# Patient Record
Sex: Male | Born: 1940 | Race: Black or African American | Hispanic: No | Marital: Married | Smoking: Current every day smoker
Health system: Southern US, Community
[De-identification: ages and names within clinical notes are randomized; demographics above are authoritative.]

## PROBLEM LIST (undated history)

## (undated) DIAGNOSIS — N419 Inflammatory disease of prostate, unspecified: Secondary | ICD-10-CM

## (undated) DIAGNOSIS — R972 Elevated prostate specific antigen [PSA]: Secondary | ICD-10-CM

## (undated) DIAGNOSIS — C61 Malignant neoplasm of prostate: Secondary | ICD-10-CM

## (undated) DIAGNOSIS — Z923 Personal history of irradiation: Secondary | ICD-10-CM

## (undated) DIAGNOSIS — C189 Malignant neoplasm of colon, unspecified: Secondary | ICD-10-CM

## (undated) DIAGNOSIS — J449 Chronic obstructive pulmonary disease, unspecified: Secondary | ICD-10-CM

## (undated) DIAGNOSIS — K562 Volvulus: Secondary | ICD-10-CM

## (undated) DIAGNOSIS — E559 Vitamin D deficiency, unspecified: Secondary | ICD-10-CM

## (undated) DIAGNOSIS — E785 Hyperlipidemia, unspecified: Secondary | ICD-10-CM

## (undated) HISTORY — DX: Inflammatory disease of prostate, unspecified: N41.9

## (undated) HISTORY — PX: TONSILLECTOMY: SUR1361

## (undated) HISTORY — DX: Elevated prostate specific antigen (PSA): R97.20

## (undated) HISTORY — DX: Hyperlipidemia, unspecified: E78.5

## (undated) HISTORY — DX: Personal history of irradiation: Z92.3

## (undated) HISTORY — DX: Chronic obstructive pulmonary disease, unspecified: J44.9

## (undated) HISTORY — DX: Vitamin D deficiency, unspecified: E55.9

---

## 2004-05-04 ENCOUNTER — Encounter: Admission: RE | Admit: 2004-05-04 | Discharge: 2004-05-04 | Payer: Self-pay | Admitting: Orthopedic Surgery

## 2004-05-20 ENCOUNTER — Encounter: Admission: RE | Admit: 2004-05-20 | Discharge: 2004-05-20 | Payer: Self-pay | Admitting: Orthopedic Surgery

## 2005-09-09 ENCOUNTER — Emergency Department (HOSPITAL_COMMUNITY): Admission: EM | Admit: 2005-09-09 | Discharge: 2005-09-09 | Payer: Self-pay | Admitting: Emergency Medicine

## 2011-01-09 ENCOUNTER — Encounter: Payer: Self-pay | Admitting: Orthopedic Surgery

## 2012-12-04 ENCOUNTER — Ambulatory Visit
Admission: RE | Admit: 2012-12-04 | Discharge: 2012-12-04 | Disposition: A | Discharge status: Home / Self Care | Payer: Medicare Other | Source: Ambulatory Visit | Attending: Internal Medicine | Admitting: Internal Medicine

## 2012-12-04 ENCOUNTER — Other Ambulatory Visit: Payer: Self-pay | Admitting: Internal Medicine

## 2012-12-04 DIAGNOSIS — J449 Chronic obstructive pulmonary disease, unspecified: Secondary | ICD-10-CM

## 2012-12-04 DIAGNOSIS — Z720 Tobacco use: Secondary | ICD-10-CM

## 2012-12-27 LAB — LIPID PANEL
LDL Cholesterol: 148 mg/dL
Triglycerides: 91 mg/dL (ref 40–160)

## 2012-12-27 LAB — CBC AND DIFFERENTIAL
HCT: 44 % (ref 41–53)
Hemoglobin: 14.6 g/dL (ref 13.5–17.5)
Platelets: 235 10*3/uL (ref 150–399)
WBC: 5.8 10^3/mL

## 2012-12-27 LAB — HEPATIC FUNCTION PANEL
ALT: 16 U/L (ref 10–40)
Bilirubin, Total: 0.4 mg/dL

## 2012-12-27 LAB — BASIC METABOLIC PANEL: Glucose: 94 mg/dL

## 2013-02-26 ENCOUNTER — Encounter: Payer: Self-pay | Admitting: Hematology

## 2013-02-27 DIAGNOSIS — C61 Malignant neoplasm of prostate: Secondary | ICD-10-CM

## 2013-02-27 HISTORY — DX: Malignant neoplasm of prostate: C61

## 2013-02-27 HISTORY — PX: PROSTATE BIOPSY: SHX241

## 2013-04-22 ENCOUNTER — Other Ambulatory Visit: Payer: Self-pay | Admitting: Urology

## 2013-04-22 DIAGNOSIS — C61 Malignant neoplasm of prostate: Secondary | ICD-10-CM

## 2013-04-29 ENCOUNTER — Encounter (HOSPITAL_COMMUNITY)
Admission: RE | Admit: 2013-04-29 | Discharge: 2013-04-29 | Disposition: A | Discharge status: Home / Self Care | Payer: Medicare Other | Source: Ambulatory Visit | Attending: Urology | Admitting: Urology

## 2013-04-29 DIAGNOSIS — C61 Malignant neoplasm of prostate: Secondary | ICD-10-CM | POA: Insufficient documentation

## 2013-04-29 MED ORDER — TECHNETIUM TC 99M MEDRONATE IV KIT
25.0000 | PACK | Freq: Once | INTRAVENOUS | Status: AC | PRN
  Administered 2013-04-29: 25 via INTRAVENOUS

## 2013-08-14 ENCOUNTER — Encounter: Payer: Self-pay | Admitting: Radiation Oncology

## 2013-08-14 NOTE — Progress Notes (Signed)
GU Location of Tumor / Histology: Prostate Cancer  If Prostate Cancer, Gleason Score is (3 + 3=6 in left lat base, left base and left lat mid) and PSA is (21.06 from 07/23/2013), prostate volume is 133 cc.  Patient presented with elevated PSA of 17.38 on 12/27/2012.  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  No rectal irritation   Nausea/Vomiting, if any: NO  Pain issues, if any:  None   SAFETY ISSUES:  Prior radiation? No   Pacemaker/ICD? no  Possible current pregnancy? no  Is the patient on methotrexate? no  Current Complaints / other details:  None presently

## 2013-08-15 ENCOUNTER — Ambulatory Visit
Admission: RE | Admit: 2013-08-15 | Discharge: 2013-08-15 | Disposition: A | Discharge status: Home / Self Care | Payer: Medicare Other | Source: Ambulatory Visit | Attending: Radiation Oncology | Admitting: Radiation Oncology

## 2013-08-15 ENCOUNTER — Encounter: Payer: Self-pay | Admitting: Radiation Oncology

## 2013-08-15 ENCOUNTER — Telehealth: Payer: Self-pay | Admitting: *Deleted

## 2013-08-15 VITALS — BP 131/88 | HR 68 | Temp 98.0°F | Ht 68.0 in | Wt 160.0 lb

## 2013-08-15 DIAGNOSIS — C61 Malignant neoplasm of prostate: Secondary | ICD-10-CM

## 2013-08-15 HISTORY — DX: Malignant neoplasm of prostate: C61

## 2013-08-15 NOTE — Progress Notes (Signed)
[facility] Radiation Oncology NEW PATIENT EVALUATION  Name: Charles Espinoza MRN: [id number]  Date:   08/15/2013           DOB: 1941/03/02  Status: outpatient   CC: Charles Saucer [name], MD  Lindaann Slough, MD    REFERRING PHYSICIAN: Lindaann Slough, MD   DIAGNOSIS: Stage TI C. intermediate risk adenocarcinoma prostate (based on prostate volume)   HISTORY OF PRESENT ILLNESS:  Charles Espinoza is a 72 y.o. male who is seen today for the courtesy of Dr. Brunilda Payor for discussion of possible radiation therapy in the management of his stage TI C. 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 He was noted to have a PSA of 17.38 by Dr. [name] back in January 2014. He was told in  the past that his PSAs have been elevated. We do not have these records. His PSA in April of 2014 was 17.47 and then rose to 21.06 on 07/23/2013. On 02/27/2013 he had ultrasound-guided biopsies revealing Gleason 6 (3+3) involving 5% of one core from left lateral base, 50% of one core from the left base and 5% of one core from the left lateral mid gland. His gland volume was 133 cc. Staging workup was negative. Because of his recent rise in PSA he is seen today for discussion of possible external beam/IMRT. He is doing well from a GU and GI standpoint. His I PSS score is 3. He claims to be potent. He is rather active and golfs multiple times a week.   PREVIOUS RADIATION THERAPY: No   PAST MEDICAL HISTORY:  has a past medical history of COPD (chronic obstructive pulmonary disease); Vitamin D deficiency; Hyperlipidemia; Prostatitis; Elevated PSA; and Prostate cancer.     PAST SURGICAL HISTORY:  Past Surgical History  Procedure Laterality Date  . Tonsillectomy    . Prostate biopsy  02/27/2013     FAMILY HISTORY: family history includes Prostate cancer in an other family member. His mother is alive and well at 47 and lives independently. His father died from "natural causes" at age 47. His maternal uncle was diagnosed with  prostate cancer in his 68s.   SOCIAL HISTORY:  reports that he quit smoking about 8 months ago. He does not have any smokeless tobacco history on file. Mary for over 50 years, 3 children. Retired Financial risk analyst.   ALLERGIES: Penicillins   MEDICATIONS:  Current Outpatient Prescriptions  Medication Sig Dispense Refill  . aspirin 81 MG tablet Take 81 mg by mouth daily. 2 po daily      . Multiple Vitamin (MULTIVITAMIN) tablet Take 1 tablet by mouth daily.       No current facility-administered medications for this encounter.     REVIEW OF SYSTEMS:  Pertinent items are noted in HPI.    PHYSICAL EXAM:  height is 5\' 8"  (1.727 m) and weight is 160 lb (72.576 kg). His temperature is 98 F (36.7 C). His blood pressure is 131/88 and his pulse is 68.   Alert and oriented 72 year old African American male appearing younger than his stated age. Head and neck examination: Grossly unremarkable. Nodes: Without palpable cervical or supraclavicular lymphadenopathy. Chest: Lungs clear. Back: Without spinal or CVA tenderness. Abdomen: Without masses organomegaly. Genitalia: Unremarkable to inspection. Rectal: The prostate gland is markedly enlarged and is without induration or nodularity. Extremities: Without edema. Neurologic examination: Grossly nonfocal.   LABORATORY DATA:  Lab Results  Component Value Date   WBC 5.8 12/27/2012   HGB 14.6 12/27/2012   HCT 44 12/27/2012   PLT  235 12/27/2012   Lab Results  Component Value Date   NA 143 12/27/2012   K 4.2 12/27/2012   Lab Results  Component Value Date   ALT 16 12/27/2012   AST 21 12/27/2012   ALKPHOS 71 12/27/2012   PSA 21.06 from 07/23/2013   IMPRESSION: Stage TI C. intermediate risk adenocarcinoma prostate (based on prostate volume). I explained to the patient that his prognosis is related to his stage, PSA level, and Gleason score. His stage and Gleason score are favorable while his absolute PSA level is technically in the  unfavorable risk category  (over 20). We do not have his PSA values from the remote past .  However, he does have a large prostate volume would certainly contributes to his PSA level, and he is probably best placed in the intermediate risk category (PSA from 10-20). He is rather fit for his age of 28, and I would offer him potentially curative therapy. His gland volume is too large for seed implantation, and we are left with external beam/IMRT which I think would be a good option. We discussed the potential acute and late toxicities of radiation therapy. We also talked about the possibility of "downsizing" his gland to treat smaller volume, and therefore he smaller portion of his rectum, but he is sexually active, and this would affect his quality of life. He declines this option. Therefore we'll proceed with treatment to his current gland volume of approximately 133 cc. We also discussed treating him with a comfortably full bladder to minimize treatment-related toxicity. I explained him that I need to have Dr. Brunilda Payor placed 3 gold markers for prostate image guidance during his treatment. Once this has been performed, he will return afterwards for simulation/treatment planning. Consent is signed today.   PLAN: As discussed above.  I spent 40 minutes minutes face to face with the patient and more than 50% of that time was spent in counseling and/or coordination of care.

## 2013-08-15 NOTE — Addendum Note (Signed)
Encounter addended by: Maryln Gottron, MD on: 08/15/2013 10:54 AM<BR>     Documentation filed: Normajean Glasgow VN

## 2013-08-15 NOTE — Telephone Encounter (Signed)
CALLED PATIENT TO INFORM OF GOLD SEED PLACEMENT ON 09-26-13- ARRIVAL TIME - 2:45 PM AT DR. Brunilda Payor' OFFICE AND HIS SIM ON 09-30-13 AT 10 AM AT DR. MURRAY'S OFFICE, SPOKE WITH PATIENT AND HE IS AWARE OF THESE APPTS.

## 2013-08-27 ENCOUNTER — Telehealth: Payer: Self-pay | Admitting: *Deleted

## 2013-08-27 NOTE — Telephone Encounter (Signed)
Called patient to inform of gold seed placement on 09-10-13  At 2:30 pm at Dr. Brunilda Payor' Office and his sim on Sept. 29 at 9:00 am, spoke with patient and he is aware of these appts.

## 2013-09-16 ENCOUNTER — Ambulatory Visit
Admission: RE | Admit: 2013-09-16 | Discharge: 2013-09-16 | Disposition: A | Discharge status: Home / Self Care | Payer: Medicare Other | Source: Ambulatory Visit | Attending: Radiation Oncology | Admitting: Radiation Oncology

## 2013-09-16 DIAGNOSIS — R339 Retention of urine, unspecified: Secondary | ICD-10-CM | POA: Insufficient documentation

## 2013-09-16 DIAGNOSIS — Z510 Encounter for antineoplastic radiation therapy: Secondary | ICD-10-CM | POA: Insufficient documentation

## 2013-09-16 DIAGNOSIS — C61 Malignant neoplasm of prostate: Secondary | ICD-10-CM | POA: Insufficient documentation

## 2013-09-16 NOTE — Progress Notes (Signed)
Complex simulation/treatment planning note:  The patient was taken to the CT simulator. A Vac lock immobilization device was constructed. A red rubber catheter was placed within the rectal vault. He was in catheterized and contrast instilled into the bladder/urethra. His pelvis was scanned. I contoured his rectum, bladder, seminal vesicles and prostate. I prescribing 7600 cGy in 38 sessions. The prostate PTV represents the prostate was 0.8 cm except for 0.5 cm along the rectum. I prescribing 5320 cGy in 30 sessions to his seminal vesicles which include the seminal vesicles posterior 0.5 cm. He'll be treated with a company full bladder. I am also requesting daily cone beam CT setting up to his 3 gold seeds.

## 2013-09-19 ENCOUNTER — Encounter: Payer: Self-pay | Admitting: Radiation Oncology

## 2013-09-19 NOTE — Progress Notes (Signed)
IMRT simulation/treatment planning note: The patient underwent IMRT/treatment planning in the management of his carcinoma the prostate. IMRT was chosen to decrease the risk for both acute and late bladder and rectal toxicity compared to 3-D conformal or conventional radiation therapy. Dose volume histograms were obtained for the target structures and also the avoidance structures including the bladder, rectum, and femoral heads. We met our departmental goals except for the bladder which was incompletely field. It is expected that this will improve when he has a comfortably full bladder during his course of treatment. I'm prescribing 7600 cGy in 30 sessions to his prostate PTV which represents the prostate was 0.8 cm except for 0.5 cm along the rectum. He is be treated with a company full bladder and he'll undergo daily cone beam CT setting up to his 3 gold seeds.

## 2013-09-23 ENCOUNTER — Ambulatory Visit: Payer: Medicare Other | Admitting: Radiation Oncology

## 2013-09-24 ENCOUNTER — Ambulatory Visit: Payer: Medicare Other

## 2013-09-25 ENCOUNTER — Ambulatory Visit
Admission: RE | Admit: 2013-09-25 | Discharge: 2013-09-25 | Disposition: A | Discharge status: Home / Self Care | Payer: Medicare Other | Source: Ambulatory Visit | Attending: Radiation Oncology | Admitting: Radiation Oncology

## 2013-09-25 ENCOUNTER — Ambulatory Visit: Payer: Medicare Other

## 2013-09-26 ENCOUNTER — Ambulatory Visit: Payer: Medicare Other

## 2013-09-26 ENCOUNTER — Ambulatory Visit
Admission: RE | Admit: 2013-09-26 | Discharge: 2013-09-26 | Disposition: A | Discharge status: Home / Self Care | Payer: Medicare Other | Source: Ambulatory Visit | Attending: Radiation Oncology | Admitting: Radiation Oncology

## 2013-09-26 DIAGNOSIS — C61 Malignant neoplasm of prostate: Secondary | ICD-10-CM

## 2013-09-26 NOTE — Progress Notes (Signed)
Post sim ed completed w/pt. Gave pt "Radiation and You" booklet w/all pertinent information marked and discussed, re: fatigue, rectal irritation/care, urinary-bladder irritation/management, nutrition, pain. All questions answered. Pt inquired about what angles his prostate was being treated; advised he ask therapists tomorrow. Pt verbalized understanding.

## 2013-09-27 ENCOUNTER — Ambulatory Visit
Admission: RE | Admit: 2013-09-27 | Discharge: 2013-09-27 | Disposition: A | Discharge status: Home / Self Care | Payer: Medicare Other | Source: Ambulatory Visit | Attending: Radiation Oncology | Admitting: Radiation Oncology

## 2013-09-27 ENCOUNTER — Ambulatory Visit: Payer: Medicare Other

## 2013-09-30 ENCOUNTER — Telehealth: Payer: Self-pay | Admitting: *Deleted

## 2013-09-30 ENCOUNTER — Ambulatory Visit: Admission: RE | Admit: 2013-09-30 | Payer: Medicare Other | Source: Ambulatory Visit

## 2013-09-30 ENCOUNTER — Ambulatory Visit: Admission: RE | Admit: 2013-09-30 | Payer: Medicare Other | Source: Ambulatory Visit | Admitting: Radiation Oncology

## 2013-09-30 ENCOUNTER — Ambulatory Visit: Payer: Medicare Other

## 2013-09-30 ENCOUNTER — Ambulatory Visit
Admission: RE | Admit: 2013-09-30 | Discharge: 2013-09-30 | Disposition: A | Discharge status: Home / Self Care | Payer: Medicare Other | Source: Ambulatory Visit | Attending: Radiation Oncology | Admitting: Radiation Oncology

## 2013-09-30 ENCOUNTER — Other Ambulatory Visit: Payer: Self-pay | Admitting: Radiation Oncology

## 2013-09-30 DIAGNOSIS — C61 Malignant neoplasm of prostate: Secondary | ICD-10-CM

## 2013-09-30 NOTE — Telephone Encounter (Signed)
Received call from pt's wife stating pt was up all night trying to urinate, only able to urinate small amounts and dribbling. Pt c/o "stinging, pressure and feeling bloated". Spoke w/Dr Dayton Scrape; per Dr Dayton Scrape pt to come into nursing to see Dr Dayton Scrape now. Called wife and informed her to have pt come in to see Dr Dayton Scrape now. She verbalized understanding.

## 2013-09-30 NOTE — Progress Notes (Addendum)
Weekly Management Note:  Site: Prostate Current Dose:  600  cGy Projected Dose: 7600  cGy  Narrative: The patient is seen today for routine under treatment assessment. CBCT/MVCT images/port films were reviewed. The chart was reviewed.   The patient went into urinary retention prior to his scheduled treatment today. He was at a funeral yesterday and held his bladder "too long". He went into retention yesterday evening. He was seen by Dr. Brunilda Payor and a catheter was placed. The catheter will be removed this Friday.  Physical Examination: There were no vitals filed for this visit..  Weight:  . No change.  Impression: Tolerating radiation therapy well, however, he went into urinary retention. I do not think that his 3 treatments of radiation therapy precipitated his urinary retention. Of note is that his preradiation I PSS score was only 3.  Plan: Continue radiation therapy as planned. We will get him this week off and resume his radiation therapy on Monday, October 20.

## 2013-09-30 NOTE — Telephone Encounter (Signed)
Dr Dayton Scrape spoke w/ Dr Madilyn Hook office re: pt's status this morning. Per Dr Dayton Scrape, pt to go straight to Dr Madilyn Hook office and be seen by NP. Spoke w/Alice, pt's wife and instructed her to have pt go directly to Dr [employer] office this morning. Advised pt ask NP if he should cancel his radiation appointment today. Wife verbalized understanding of all above.

## 2013-10-01 ENCOUNTER — Ambulatory Visit: Payer: Medicare Other

## 2013-10-02 ENCOUNTER — Ambulatory Visit: Payer: Medicare Other

## 2013-10-03 ENCOUNTER — Ambulatory Visit: Payer: Medicare Other

## 2013-10-04 ENCOUNTER — Ambulatory Visit: Payer: Medicare Other

## 2013-10-04 ENCOUNTER — Telehealth: Payer: Self-pay | Admitting: *Deleted

## 2013-10-04 NOTE — Telephone Encounter (Signed)
Per Dr Dayton Scrape, spoke w.pt and advised him to come to [facility] Monday at 10:15 am to see Dr Dayton Scrape before his radiation treatment scheduled for 10:40 am. Pt repeated instructions accurately. Pt states he just got home from urologist's office where his foley catheter was removed. Flagged pt's ARIA schedule to reflect seeing dr prior to treatment. Linac 1 notified by e mail.

## 2013-10-07 ENCOUNTER — Encounter: Payer: Self-pay | Admitting: Radiation Oncology

## 2013-10-07 ENCOUNTER — Ambulatory Visit: Payer: Medicare Other

## 2013-10-07 ENCOUNTER — Ambulatory Visit
Admission: RE | Admit: 2013-10-07 | Discharge: 2013-10-07 | Disposition: A | Discharge status: Home / Self Care | Payer: Medicare Other | Source: Ambulatory Visit | Attending: Radiation Oncology | Admitting: Radiation Oncology

## 2013-10-07 DIAGNOSIS — C61 Malignant neoplasm of prostate: Secondary | ICD-10-CM

## 2013-10-07 NOTE — Progress Notes (Signed)
Weekly Management Note:  Site: Prostate Current Dose:  800  cGy Projected Dose: 7600  cGy  Narrative: The patient is seen today for routine under treatment assessment. CBCT/MVCT images/port films were reviewed. The chart was reviewed.   He is seen today prior to his scheduled treatment after being rested last week. He went into urinary retention and his catheter was removed this past Friday. He was placed on finasteride and Rapaflo. He's not having urinary difficulties today. Cone beam CT will be reviewed later today.  Physical Examination: There were no vitals filed for this visit..  Weight:  . No change.  Impression: Tolerating radiation therapy well. We may add 1 extra treatment because of his one-week break.  Plan: Continue radiation therapy as planned.

## 2013-10-07 NOTE — Progress Notes (Signed)
Pt states his foley catheter was removed on 10/05/13. He denies voiding issues since that time. He denies bowel issues, pain, fatigue, loss of appetite. He states he was put on Finasteride and Rapaflo, doses unknown.

## 2013-10-08 ENCOUNTER — Ambulatory Visit: Payer: Medicare Other

## 2013-10-08 ENCOUNTER — Ambulatory Visit
Admission: RE | Admit: 2013-10-08 | Discharge: 2013-10-08 | Disposition: A | Discharge status: Home / Self Care | Payer: Medicare Other | Source: Ambulatory Visit | Attending: Radiation Oncology | Admitting: Radiation Oncology

## 2013-10-09 ENCOUNTER — Ambulatory Visit
Admission: RE | Admit: 2013-10-09 | Discharge: 2013-10-09 | Disposition: A | Discharge status: Home / Self Care | Payer: Medicare Other | Source: Ambulatory Visit | Attending: Radiation Oncology | Admitting: Radiation Oncology

## 2013-10-09 ENCOUNTER — Ambulatory Visit: Payer: Medicare Other

## 2013-10-10 ENCOUNTER — Ambulatory Visit
Admission: RE | Admit: 2013-10-10 | Discharge: 2013-10-10 | Disposition: A | Discharge status: Home / Self Care | Payer: Medicare Other | Source: Ambulatory Visit | Attending: Radiation Oncology | Admitting: Radiation Oncology

## 2013-10-10 ENCOUNTER — Ambulatory Visit: Payer: Medicare Other

## 2013-10-11 ENCOUNTER — Ambulatory Visit: Payer: Medicare Other

## 2013-10-11 ENCOUNTER — Ambulatory Visit
Admission: RE | Admit: 2013-10-11 | Discharge: 2013-10-11 | Disposition: A | Discharge status: Home / Self Care | Payer: Medicare Other | Source: Ambulatory Visit | Attending: Radiation Oncology | Admitting: Radiation Oncology

## 2013-10-14 ENCOUNTER — Ambulatory Visit: Admission: RE | Admit: 2013-10-14 | Payer: Medicare Other | Source: Ambulatory Visit | Admitting: Radiation Oncology

## 2013-10-14 ENCOUNTER — Ambulatory Visit
Admission: RE | Admit: 2013-10-14 | Discharge: 2013-10-14 | Disposition: A | Discharge status: Home / Self Care | Payer: Medicare Other | Source: Ambulatory Visit | Attending: Radiation Oncology | Admitting: Radiation Oncology

## 2013-10-14 ENCOUNTER — Ambulatory Visit: Payer: Medicare Other

## 2013-10-14 DIAGNOSIS — C61 Malignant neoplasm of prostate: Secondary | ICD-10-CM

## 2013-10-14 NOTE — Addendum Note (Signed)
Encounter addended by: Glennie Hawk, RN on: 10/14/2013 11:20 AM<BR>     Documentation filed: Notes Section

## 2013-10-14 NOTE — Progress Notes (Signed)
Pt seen by Dr Dayton Scrape in treatment area. No nursing assessment completed.

## 2013-10-14 NOTE — Progress Notes (Signed)
Weekly Management Note:  Site: Prostate Current Dose:  1800  cGy Projected Dose: 7600  cGy  Narrative: The patient is seen today for routine under treatment assessment. CBCT/MVCT images/port films were reviewed. The chart was reviewed.   Bladder filling has been satisfactory. No new GU or GI difficulty. He seen today before his treatment.  Physical Examination: There were no vitals filed for this visit..  Weight:  . No change.  Impression: Tolerating radiation therapy well.  Plan: Continue radiation therapy as planned.

## 2013-10-15 ENCOUNTER — Ambulatory Visit: Payer: Medicare Other

## 2013-10-15 ENCOUNTER — Ambulatory Visit
Admission: RE | Admit: 2013-10-15 | Discharge: 2013-10-15 | Disposition: A | Discharge status: Home / Self Care | Payer: Medicare Other | Source: Ambulatory Visit | Attending: Radiation Oncology | Admitting: Radiation Oncology

## 2013-10-16 ENCOUNTER — Ambulatory Visit
Admission: RE | Admit: 2013-10-16 | Discharge: 2013-10-16 | Disposition: A | Discharge status: Home / Self Care | Payer: Medicare Other | Source: Ambulatory Visit | Attending: Radiation Oncology | Admitting: Radiation Oncology

## 2013-10-17 ENCOUNTER — Ambulatory Visit
Admission: RE | Admit: 2013-10-17 | Discharge: 2013-10-17 | Disposition: A | Discharge status: Home / Self Care | Payer: Medicare Other | Source: Ambulatory Visit | Attending: Radiation Oncology | Admitting: Radiation Oncology

## 2013-10-17 ENCOUNTER — Ambulatory Visit: Payer: Medicare Other

## 2013-10-18 ENCOUNTER — Ambulatory Visit: Payer: Medicare Other

## 2013-10-18 ENCOUNTER — Ambulatory Visit
Admission: RE | Admit: 2013-10-18 | Discharge: 2013-10-18 | Disposition: A | Discharge status: Home / Self Care | Payer: Medicare Other | Source: Ambulatory Visit | Attending: Radiation Oncology | Admitting: Radiation Oncology

## 2013-10-21 ENCOUNTER — Ambulatory Visit
Admission: RE | Admit: 2013-10-21 | Discharge: 2013-10-21 | Disposition: A | Discharge status: Home / Self Care | Payer: Medicare Other | Source: Ambulatory Visit | Attending: Radiation Oncology | Admitting: Radiation Oncology

## 2013-10-21 ENCOUNTER — Ambulatory Visit: Payer: Medicare Other

## 2013-10-21 ENCOUNTER — Encounter: Payer: Self-pay | Admitting: Radiation Oncology

## 2013-10-21 VITALS — BP 140/75 | HR 75 | Temp 97.4°F | Resp 20 | Wt 163.6 lb

## 2013-10-21 DIAGNOSIS — C61 Malignant neoplasm of prostate: Secondary | ICD-10-CM

## 2013-10-21 NOTE — Progress Notes (Signed)
Pt denies pain, bladder/bowel issues, fatigue, loss of appetite. 

## 2013-10-21 NOTE — Progress Notes (Signed)
Weekly Management Note:  Site: Prostate Current Dose:  2800  cGy Projected Dose: 7600  cGy  Narrative: The patient is seen today for routine under treatment assessment. CBCT/MVCT images/port films were reviewed. The chart was reviewed.   Bladder filling satisfactory. No GU or GI difficulties.  Physical Examination:  Filed Vitals:   10/21/13 1050  BP: 140/75  Pulse: 75  Temp: 97.4 F (36.3 C)  Resp: 20  .  Weight: 163 lb 9.6 oz (74.208 kg). No change.  Impression: Tolerating radiation therapy well.  Plan: Continue radiation therapy as planned.

## 2013-10-22 ENCOUNTER — Ambulatory Visit: Payer: Medicare Other

## 2013-10-22 ENCOUNTER — Ambulatory Visit
Admission: RE | Admit: 2013-10-22 | Discharge: 2013-10-22 | Disposition: A | Discharge status: Home / Self Care | Payer: Medicare Other | Source: Ambulatory Visit | Attending: Radiation Oncology | Admitting: Radiation Oncology

## 2013-10-23 ENCOUNTER — Ambulatory Visit
Admission: RE | Admit: 2013-10-23 | Discharge: 2013-10-23 | Disposition: A | Discharge status: Home / Self Care | Payer: Medicare Other | Source: Ambulatory Visit | Attending: Radiation Oncology | Admitting: Radiation Oncology

## 2013-10-23 ENCOUNTER — Ambulatory Visit: Payer: Medicare Other

## 2013-10-24 ENCOUNTER — Ambulatory Visit
Admission: RE | Admit: 2013-10-24 | Discharge: 2013-10-24 | Disposition: A | Discharge status: Home / Self Care | Payer: Medicare Other | Source: Ambulatory Visit | Attending: Radiation Oncology | Admitting: Radiation Oncology

## 2013-10-24 ENCOUNTER — Ambulatory Visit: Payer: Medicare Other

## 2013-10-25 ENCOUNTER — Ambulatory Visit: Payer: Medicare Other

## 2013-10-25 ENCOUNTER — Ambulatory Visit
Admission: RE | Admit: 2013-10-25 | Discharge: 2013-10-25 | Disposition: A | Discharge status: Home / Self Care | Payer: Medicare Other | Source: Ambulatory Visit | Attending: Radiation Oncology | Admitting: Radiation Oncology

## 2013-10-28 ENCOUNTER — Encounter: Payer: Self-pay | Admitting: Radiation Oncology

## 2013-10-28 ENCOUNTER — Ambulatory Visit
Admission: RE | Admit: 2013-10-28 | Discharge: 2013-10-28 | Disposition: A | Discharge status: Home / Self Care | Payer: Medicare Other | Source: Ambulatory Visit | Attending: Radiation Oncology | Admitting: Radiation Oncology

## 2013-10-28 ENCOUNTER — Ambulatory Visit: Payer: Medicare Other

## 2013-10-28 VITALS — BP 127/74 | HR 81 | Temp 97.8°F | Resp 20 | Wt 163.0 lb

## 2013-10-28 DIAGNOSIS — C61 Malignant neoplasm of prostate: Secondary | ICD-10-CM

## 2013-10-28 NOTE — Progress Notes (Signed)
Weekly Management Note:  Site: Prostate Current Dose:  3800  cGy Projected Dose: 7600  cGy  Narrative: The patient is seen today for routine under treatment assessment. CBCT/MVCT images/port films were reviewed. The chart was reviewed.   Bladder filling is satisfactory. No new GU or GI difficulties. He finished up Cipro for a UTI that he developed after removal of his urinary catheter.  Physical Examination:  Filed Vitals:   10/28/13 1105  BP: 127/74  Pulse: 81  Temp: 97.8 F (36.6 C)  Resp: 20  .  Weight: 163 lb (73.936 kg). No change.  Impression: Tolerating radiation therapy well. With a one-week break I will add one extra fraction of radiation therapy so that he will receive a cumulative dose of 7800 cGy in 39 sessions.  Plan: Continue radiation therapy as planned.

## 2013-10-28 NOTE — Progress Notes (Signed)
Pt states he completed course of Cipro over the weekend. He states he was taking it for UTI w/staph infection per UA in Dr Madilyn Hook office. Pt denies dysuria, urinary frequency, urgency, is up x 1 at night, denies fatigue, loss of appetite, bowel issues.

## 2013-10-29 ENCOUNTER — Ambulatory Visit: Payer: Medicare Other

## 2013-10-29 ENCOUNTER — Ambulatory Visit
Admission: RE | Admit: 2013-10-29 | Discharge: 2013-10-29 | Disposition: A | Discharge status: Home / Self Care | Payer: Medicare Other | Source: Ambulatory Visit | Attending: Radiation Oncology | Admitting: Radiation Oncology

## 2013-10-30 ENCOUNTER — Ambulatory Visit: Payer: Medicare Other

## 2013-10-30 ENCOUNTER — Ambulatory Visit
Admission: RE | Admit: 2013-10-30 | Discharge: 2013-10-30 | Disposition: A | Discharge status: Home / Self Care | Payer: Medicare Other | Source: Ambulatory Visit | Attending: Radiation Oncology | Admitting: Radiation Oncology

## 2013-10-31 ENCOUNTER — Ambulatory Visit: Payer: Medicare Other

## 2013-10-31 ENCOUNTER — Ambulatory Visit
Admission: RE | Admit: 2013-10-31 | Discharge: 2013-10-31 | Disposition: A | Discharge status: Home / Self Care | Payer: Medicare Other | Source: Ambulatory Visit | Attending: Radiation Oncology | Admitting: Radiation Oncology

## 2013-11-01 ENCOUNTER — Ambulatory Visit
Admission: RE | Admit: 2013-11-01 | Discharge: 2013-11-01 | Disposition: A | Discharge status: Home / Self Care | Payer: Medicare Other | Source: Ambulatory Visit | Attending: Radiation Oncology | Admitting: Radiation Oncology

## 2013-11-01 ENCOUNTER — Ambulatory Visit: Payer: Medicare Other

## 2013-11-04 ENCOUNTER — Ambulatory Visit: Payer: Medicare Other

## 2013-11-04 ENCOUNTER — Encounter: Payer: Self-pay | Admitting: Radiation Oncology

## 2013-11-04 ENCOUNTER — Ambulatory Visit
Admission: RE | Admit: 2013-11-04 | Discharge: 2013-11-04 | Disposition: A | Discharge status: Home / Self Care | Payer: Medicare Other | Source: Ambulatory Visit | Attending: Radiation Oncology | Admitting: Radiation Oncology

## 2013-11-04 VITALS — BP 127/83 | HR 78 | Temp 97.5°F | Resp 20 | Wt 166.9 lb

## 2013-11-04 DIAGNOSIS — C61 Malignant neoplasm of prostate: Secondary | ICD-10-CM

## 2013-11-04 NOTE — Progress Notes (Signed)
Pt denies pain, fatigue, loss of appetite, bladder or bowel issues. He states since he increased his water intake he does not have dysuria or weak stream.

## 2013-11-04 NOTE — Progress Notes (Signed)
Weekly Management Note:  Site: Prostate Current Dose:  5800  cGy Projected Dose: 7800  cGy  Narrative: The patient is seen today for routine under treatment assessment. CBCT/MVCT images/port films were reviewed. The chart was reviewed.   Bladder filling is satisfactory. No new GU or GI difficulties.  Physical Examination:  Filed Vitals:   11/04/13 1055  BP: 127/83  Pulse: 78  Temp: 97.5 F (36.4 C)  Resp: 20  .  Weight: 166 lb 14.4 oz (75.705 kg). No change.  Impression: Tolerating radiation therapy well.  Plan: Continue radiation therapy as planned.

## 2013-11-05 ENCOUNTER — Ambulatory Visit
Admission: RE | Admit: 2013-11-05 | Discharge: 2013-11-05 | Disposition: A | Discharge status: Home / Self Care | Payer: Medicare Other | Source: Ambulatory Visit | Attending: Radiation Oncology | Admitting: Radiation Oncology

## 2013-11-05 ENCOUNTER — Ambulatory Visit: Payer: Medicare Other

## 2013-11-06 ENCOUNTER — Ambulatory Visit: Payer: Medicare Other

## 2013-11-06 ENCOUNTER — Ambulatory Visit
Admission: RE | Admit: 2013-11-06 | Discharge: 2013-11-06 | Disposition: A | Discharge status: Home / Self Care | Payer: Medicare Other | Source: Ambulatory Visit | Attending: Radiation Oncology | Admitting: Radiation Oncology

## 2013-11-07 ENCOUNTER — Ambulatory Visit: Payer: Medicare Other

## 2013-11-07 ENCOUNTER — Ambulatory Visit
Admission: RE | Admit: 2013-11-07 | Discharge: 2013-11-07 | Disposition: A | Discharge status: Home / Self Care | Payer: Medicare Other | Source: Ambulatory Visit | Attending: Radiation Oncology | Admitting: Radiation Oncology

## 2013-11-08 ENCOUNTER — Ambulatory Visit
Admission: RE | Admit: 2013-11-08 | Discharge: 2013-11-08 | Disposition: A | Discharge status: Home / Self Care | Payer: Medicare Other | Source: Ambulatory Visit | Attending: Radiation Oncology | Admitting: Radiation Oncology

## 2013-11-08 ENCOUNTER — Ambulatory Visit: Payer: Medicare Other

## 2013-11-10 ENCOUNTER — Ambulatory Visit
Admission: RE | Admit: 2013-11-10 | Discharge: 2013-11-10 | Disposition: A | Discharge status: Home / Self Care | Payer: Medicare Other | Source: Ambulatory Visit | Attending: Radiation Oncology | Admitting: Radiation Oncology

## 2013-11-11 ENCOUNTER — Encounter: Payer: Self-pay | Admitting: Radiation Oncology

## 2013-11-11 ENCOUNTER — Ambulatory Visit: Payer: Medicare Other

## 2013-11-11 ENCOUNTER — Ambulatory Visit
Admission: RE | Admit: 2013-11-11 | Discharge: 2013-11-11 | Disposition: A | Discharge status: Home / Self Care | Payer: Medicare Other | Source: Ambulatory Visit | Attending: Radiation Oncology | Admitting: Radiation Oncology

## 2013-11-11 VITALS — BP 136/82 | HR 82 | Temp 98.1°F | Resp 20 | Wt 161.9 lb

## 2013-11-11 DIAGNOSIS — C61 Malignant neoplasm of prostate: Secondary | ICD-10-CM

## 2013-11-11 NOTE — Progress Notes (Signed)
Pt denies pain, bladder/bowel issues, fatigue, loss of appetite. 

## 2013-11-11 NOTE — Progress Notes (Signed)
Weekly Management Note:  Site: Prostate Current Dose:  6000  cGy Projected Dose: 7800  cGy  Narrative: The patient is seen today for routine under treatment assessment. CBCT/MVCT images/port films were reviewed. The chart was reviewed.   Bladder filling is satisfactory. No GU or GI difficulties. He does have nocturia x1.  Physical Examination:  Filed Vitals:   11/11/13 1051  BP: 136/82  Pulse: 82  Temp: 98.1 F (36.7 C)  Resp: 20  .  Weight: 161 lb 14.4 oz (73.437 kg). No change.  Impression: Tolerating radiation therapy well.  Plan: Continue radiation therapy as planned.

## 2013-11-12 ENCOUNTER — Ambulatory Visit: Payer: Medicare Other

## 2013-11-12 ENCOUNTER — Ambulatory Visit
Admission: RE | Admit: 2013-11-12 | Discharge: 2013-11-12 | Disposition: A | Discharge status: Home / Self Care | Payer: Medicare Other | Source: Ambulatory Visit | Attending: Radiation Oncology | Admitting: Radiation Oncology

## 2013-11-13 ENCOUNTER — Ambulatory Visit
Admission: RE | Admit: 2013-11-13 | Discharge: 2013-11-13 | Disposition: A | Discharge status: Home / Self Care | Payer: Medicare Other | Source: Ambulatory Visit | Attending: Radiation Oncology | Admitting: Radiation Oncology

## 2013-11-13 ENCOUNTER — Ambulatory Visit: Admission: RE | Admit: 2013-11-13 | Payer: Medicare Other | Source: Ambulatory Visit

## 2013-11-18 ENCOUNTER — Ambulatory Visit
Admission: RE | Admit: 2013-11-18 | Discharge: 2013-11-18 | Disposition: A | Discharge status: Home / Self Care | Payer: Medicare Other | Source: Ambulatory Visit | Attending: Radiation Oncology | Admitting: Radiation Oncology

## 2013-11-19 ENCOUNTER — Ambulatory Visit: Admission: RE | Admit: 2013-11-19 | Payer: Medicare Other | Source: Ambulatory Visit | Admitting: Radiation Oncology

## 2013-11-19 ENCOUNTER — Ambulatory Visit: Payer: Medicare Other

## 2013-11-19 ENCOUNTER — Ambulatory Visit: Admission: RE | Admit: 2013-11-19 | Payer: Medicare Other | Source: Ambulatory Visit

## 2013-11-20 ENCOUNTER — Ambulatory Visit
Admission: RE | Admit: 2013-11-20 | Discharge: 2013-11-20 | Disposition: A | Discharge status: Home / Self Care | Payer: Medicare Other | Source: Ambulatory Visit | Attending: Radiation Oncology | Admitting: Radiation Oncology

## 2013-11-20 ENCOUNTER — Encounter: Payer: Self-pay | Admitting: Radiation Oncology

## 2013-11-20 VITALS — BP 138/81 | HR 74 | Temp 97.4°F | Resp 20 | Wt 163.2 lb

## 2013-11-20 DIAGNOSIS — C61 Malignant neoplasm of prostate: Secondary | ICD-10-CM

## 2013-11-20 NOTE — Progress Notes (Signed)
Pt denies pain, bowel issues, fatigue, loss of appetite. He states he has nocturia x 1-2 and that his dysuria has gone away. Pt taking Flomax.

## 2013-11-20 NOTE — Progress Notes (Signed)
Weekly Management Note:  Site: Prostate Current Dose:  6800  cGy Projected Dose: 7800  cGy  Narrative: The patient is seen today for routine under treatment assessment. CBCT/MVCT images/port films were reviewed. The chart was reviewed.   Bladder filling is excellent today. No new GU or GI difficulties.  Physical Examination:  Filed Vitals:   11/20/13 1147  BP: 138/81  Pulse: 74  Temp: 97.4 F (36.3 C)  Resp: 20  .  Weight: 163 lb 3.2 oz (74.027 kg). No change.  Impression: Tolerating radiation therapy well.  Plan: Continue radiation therapy as planned.

## 2013-11-21 ENCOUNTER — Ambulatory Visit
Admission: RE | Admit: 2013-11-21 | Discharge: 2013-11-21 | Disposition: A | Discharge status: Home / Self Care | Payer: Medicare Other | Source: Ambulatory Visit | Attending: Radiation Oncology | Admitting: Radiation Oncology

## 2013-11-22 ENCOUNTER — Ambulatory Visit
Admission: RE | Admit: 2013-11-22 | Discharge: 2013-11-22 | Disposition: A | Discharge status: Home / Self Care | Payer: Medicare Other | Source: Ambulatory Visit | Attending: Radiation Oncology | Admitting: Radiation Oncology

## 2013-11-25 ENCOUNTER — Encounter: Payer: Self-pay | Admitting: Radiation Oncology

## 2013-11-25 ENCOUNTER — Ambulatory Visit
Admission: RE | Admit: 2013-11-25 | Discharge: 2013-11-25 | Disposition: A | Discharge status: Home / Self Care | Payer: Medicare Other | Source: Ambulatory Visit | Attending: Radiation Oncology | Admitting: Radiation Oncology

## 2013-11-25 VITALS — BP 137/90 | HR 71 | Temp 97.8°F | Resp 20 | Wt 164.9 lb

## 2013-11-25 DIAGNOSIS — C61 Malignant neoplasm of prostate: Secondary | ICD-10-CM

## 2013-11-25 NOTE — Progress Notes (Signed)
Weekly Management Note:  Site: Prostate Current Dose:  7400  cGy Projected Dose: 7800  cGy  Narrative: The patient is seen today for routine under treatment assessment. CBCT/MVCT images/port films were reviewed. The chart was reviewed.   Bladder filling is excellent today. No new GU or GI difficulty.  Physical Examination:  Filed Vitals:   11/25/13 1122  BP: 137/90  Pulse: 71  Temp: 97.8 F (36.6 C)  Resp: 20  .  Weight: 164 lb 14.4 oz (74.798 kg). No change.  Impression: Tolerating radiation therapy well. He'll finish his treatment this Wednesday.  Plan: Continue radiation therapy as planned. One-month followup after completion of radiation therapy this Wednesday.

## 2013-11-25 NOTE — Progress Notes (Signed)
Pt denies pain, urinary/bowel issues, fatigue, loss of appetite. He gets up once at night to void.

## 2013-11-26 ENCOUNTER — Ambulatory Visit: Payer: Medicare Other

## 2013-11-26 ENCOUNTER — Ambulatory Visit
Admission: RE | Admit: 2013-11-26 | Discharge: 2013-11-26 | Disposition: A | Discharge status: Home / Self Care | Payer: Medicare Other | Source: Ambulatory Visit | Attending: Radiation Oncology | Admitting: Radiation Oncology

## 2013-11-27 ENCOUNTER — Ambulatory Visit: Payer: Medicare Other

## 2013-11-27 ENCOUNTER — Encounter: Payer: Self-pay | Admitting: Radiation Oncology

## 2013-11-27 ENCOUNTER — Ambulatory Visit
Admission: RE | Admit: 2013-11-27 | Discharge: 2013-11-27 | Disposition: A | Discharge status: Home / Self Care | Payer: Medicare Other | Source: Ambulatory Visit | Attending: Radiation Oncology | Admitting: Radiation Oncology

## 2013-11-27 NOTE — Progress Notes (Signed)
[facility] Radiation Oncology End of Treatment Note  Name:Charles Espinoza  Date: 11/27/2013 BMW:[id number] DOB:1941-10-02   Status:outpatient    CC: Willey Blade, MD  Dr. Su Grand  REFERRING PHYSICIAN: Dr. Su Grand   DIAGNOSIS:  Stage TI C. intermediate risk adenocarcinoma prostate  INDICATION FOR TREATMENT: Curative   TREATMENT DATES: 09/25/2013 through 11/27/2013                          SITE/DOSE:  Prostate   7800 cGy in 39 sessions                       BEAMS/ENERGY:   6 MV photons, VMAT IMRT with 2 arcs.                NARRATIVE:  The patient tolerated his treatment reasonably well, however he went into urinary retention before completing his first week of therapy. He required a catheter for a week and initiation of alpha blockers.   He had no significant GU or GI difficulties during the remainder of his treatment.                       PLAN: Routine followup in one month. Patient instructed to call if questions or worsening complaints in interim.

## 2013-11-27 NOTE — Progress Notes (Signed)
Chart note: On 09/25/2013 the patient began his IMRT in the management of his carcinoma the prostate. He was set up with 2 modulated arcs representing one set of IMRT treatment devices [phone])

## 2013-12-03 ENCOUNTER — Encounter: Payer: Self-pay | Admitting: Radiation Oncology

## 2013-12-03 NOTE — Progress Notes (Signed)
Chart note: On October 8 Mr. Charles Espinoza began his IMRT. He was treated with a durable arcs with 2 sets of multileaf dynamic collimators representing one set of IMRT treatment devices [phone])

## 2013-12-20 ENCOUNTER — Encounter: Payer: Self-pay | Admitting: *Deleted

## 2014-01-07 ENCOUNTER — Ambulatory Visit
Admission: RE | Admit: 2014-01-07 | Discharge: 2014-01-07 | Disposition: A | Discharge status: Home / Self Care | Payer: Medicare Other | Source: Ambulatory Visit | Attending: Radiation Oncology | Admitting: Radiation Oncology

## 2014-01-07 ENCOUNTER — Encounter: Payer: Self-pay | Admitting: Radiation Oncology

## 2014-01-07 VITALS — BP 127/85 | HR 81 | Temp 98.0°F | Resp 20 | Wt 161.0 lb

## 2014-01-07 DIAGNOSIS — C61 Malignant neoplasm of prostate: Secondary | ICD-10-CM

## 2014-01-07 NOTE — Progress Notes (Signed)
CC: Dr. Lowella Bandy  Followup note: Mr. Rhines returns today approximately 5 weeks following completion of external beam/IMRT in the management of his stage TI C. 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 He is without GU or GI difficulties. It is recalled that he went into urinary retention after his first week of radiation therapy and required a catheter for one week. He continues with tamsulosin each day. He sees Dr. Janice Norrie for a followup visit on January 26.  Physical examination: Alert and oriented. Filed Vitals:   01/07/14 1104  BP: 127/85  Pulse: 81  Temp: 98 F (36.7 C)  Resp: 20   Rectal examination not performed today.  Impression: Satisfactory progress with no sequelae following his radiation therapy. I've not scheduled the patient for a formal followup visit with me, and request that Dr. Janice Norrie keep me posted on his progress.  Plan: Followup through Dr. Janice Norrie.

## 2014-01-07 NOTE — Progress Notes (Signed)
Pt denies pain, urinary or bowel issues, fatigue, loss of appetite. He has appointment with Dr Janice Norrie on 01/13/14.

## 2014-01-24 ENCOUNTER — Other Ambulatory Visit: Payer: Self-pay | Admitting: Radiation Oncology

## 2014-01-24 NOTE — Progress Notes (Signed)
  Radiation Oncology         [phone] ________________________________  Name: Charles Espinoza  MRN: [id number]  Date: 01/24/2014  DOB: Oct 08, 1941  Telephone contact:  I was contacted by the on-call triage nursing service.  This patient who completed radiation for prostate cancer approximately 2 months ago has noticed dysuria with a tinge of blood for the past few days.   After confirming only PCN allergy, reviewing meds, confirming afebrile, and no flank pain, I empirically started Cipro 500 BID for 7 days and advised follow-up with urology for UA in the next few weeks.  I advised the patient that if symptoms worsen to call back or consider proceeding to the emergency room for further evaluation.  These instructions were conveyed to the patient by the nursing service.  ________________________________  Sheral Apley Tammi Klippel, M.D.

## 2014-01-27 ENCOUNTER — Telehealth: Payer: Self-pay | Admitting: *Deleted

## 2014-01-27 NOTE — Telephone Encounter (Signed)
Spoke w/pt re: triage RN message this RN received today. Per note, pt had noticed blood in his urine since 01/20/14, and he called triage RN on 01/24/14. Per triage RN's note,  pt was prescribed Cipro 500 mg BID x 7 days by Dr Tammi Klippel . Pt states he is taking Cipro and will complete entire prescription. He states he has had no further blood in his urine and denies other urinary issues or problems. Pt states he will call  Dr Sammie Bench office this afternoon. Will FU w/pt later this week.

## 2014-01-30 NOTE — Telephone Encounter (Signed)
Spoke w/pt to FU on his urinary issue. Pt states "I am doing fine". He denies any urinary problems at this time. He states he has FU appointment w/Dr Janice Norrie next week. Will in basket Dr Valere Dross.

## 2014-12-01 ENCOUNTER — Emergency Department (INDEPENDENT_AMBULATORY_CARE_PROVIDER_SITE_OTHER)
Admission: EM | Admit: 2014-12-01 | Discharge: 2014-12-01 | Disposition: A | Discharge status: Home / Self Care | Payer: Medicare Other | Source: Home / Self Care | Attending: Family Medicine | Admitting: Family Medicine

## 2014-12-01 ENCOUNTER — Encounter (HOSPITAL_COMMUNITY): Payer: Self-pay | Admitting: Emergency Medicine

## 2014-12-01 DIAGNOSIS — T162XXA Foreign body in left ear, initial encounter: Secondary | ICD-10-CM

## 2014-12-01 NOTE — ED Provider Notes (Signed)
CSN: [id number]     Arrival date & time 12/01/14  1628 History   None    Chief Complaint  Patient presents with  . Foreign Body in [place]   (Consider location/radiation/quality/duration/timing/severity/associated sxs/prior Treatment) Patient is a 73 y.o. male presenting with foreign body in ear. The history is provided by the patient.  Foreign Body in Ear This is a new problem. The current episode started 1 to 2 hours ago (concern about end of q-tip in ear after cleaning ear tonight). The problem has not changed since onset.Pertinent negatives include no headaches.    Past Medical History  Diagnosis Date  . COPD (chronic obstructive pulmonary disease)   . Vitamin D deficiency   . Hyperlipidemia   . Prostatitis   . Elevated PSA   . Prostate cancer 02/27/13    gleason 6, volume 133 cc  . Hx of radiation therapy 09/25/13- 11/27/13    prostate 7800 cGy 39 sessions   Past Surgical History  Procedure Laterality Date  . Tonsillectomy    . Prostate biopsy  02/27/2013   Family History  Problem Relation Age of Onset  . Prostate cancer     History  Substance Use Topics  . Smoking status: Former Smoker -- 1.00 packs/day for 19 years    Quit date: 11/22/2012  . Smokeless tobacco: Not on file  . Alcohol Use: Not on file    Review of Systems  Constitutional: Negative.   HENT: Positive for ear pain and hearing loss. Negative for ear discharge.   Neurological: Negative for headaches.    Allergies  Penicillins  Home Medications   Prior to Admission medications   Medication Sig Start Date End Date Taking? Authorizing Provider  aspirin 81 MG tablet Take 81 mg by mouth daily. 2 po daily    Historical Provider, MD  finasteride (PROSCAR) 5 MG tablet Take 5 mg by mouth daily.    Historical Provider, MD  Multiple Vitamin (MULTIVITAMIN) tablet Take 1 tablet by mouth daily.    Historical Provider, MD  silodosin (RAPAFLO) 4 MG CAPS capsule Take 8 mg by mouth daily with breakfast.     Historical Provider, MD  tamsulosin (FLOMAX) 0.4 MG CAPS capsule Take 0.4 mg by mouth daily after breakfast.    Historical Provider, MD   BP 131/79 mmHg  Pulse 64  Temp(Src) 98 F (36.7 C) (Oral)  Resp 14  SpO2 99% Physical Exam  Constitutional: He is oriented to person, place, and time. He appears well-developed and well-nourished.  HENT:  Right Ear: External ear normal.  Left Ear: A foreign body is present.  Nose: Nose normal.  Mouth/Throat: Oropharynx is clear and moist.  Neurological: He is alert and oriented to person, place, and time.  Skin: Skin is warm and dry.  Nursing note and vitals reviewed.   ED Course  FOREIGN BODY REMOVAL Date/Time: 12/01/2014 5:46 PM Performed by: Billy Fischer Authorized by: Ihor Gully D Consent: Verbal consent obtained. Consent given by: patient Body area: ear Location details: left ear Patient sedated: no Patient restrained: no Patient cooperative: yes Localization method: visualized Removal mechanism: alligator forceps Complexity: simple 1 objects recovered. Objects recovered: cotton ball tip Post-procedure assessment: foreign body removed Patient tolerance: Patient tolerated the procedure well with no immediate complications Comments: Sx resolved.   (including critical care time) Labs Review Labs Reviewed - No data to display  Imaging Review No results found.   MDM   1. Foreign body in left ear, initial encounter  Billy Fischer, MD 12/03/14 1901

## 2015-10-07 ENCOUNTER — Emergency Department (INDEPENDENT_AMBULATORY_CARE_PROVIDER_SITE_OTHER): Payer: Medicare Other

## 2015-10-07 ENCOUNTER — Emergency Department (INDEPENDENT_AMBULATORY_CARE_PROVIDER_SITE_OTHER)
Admission: EM | Admit: 2015-10-07 | Discharge: 2015-10-07 | Disposition: A | Discharge status: Home / Self Care | Payer: Medicare Other | Source: Home / Self Care | Attending: Emergency Medicine | Admitting: Emergency Medicine

## 2015-10-07 ENCOUNTER — Encounter (HOSPITAL_COMMUNITY): Payer: Self-pay | Admitting: Emergency Medicine

## 2015-10-07 DIAGNOSIS — J40 Bronchitis, not specified as acute or chronic: Secondary | ICD-10-CM

## 2015-10-07 MED ORDER — DOXYCYCLINE HYCLATE 100 MG PO CAPS: 100.0000 mg | ORAL_CAPSULE | Freq: Two times a day (BID) | ORAL | Status: DC

## 2015-10-07 MED ORDER — PREDNISONE 50 MG PO TABS: ORAL_TABLET | ORAL | Status: DC

## 2015-10-07 NOTE — Discharge Instructions (Signed)
You have bronchitis. Take doxycyline and prednisone as prescribed. You should see improvement in the next 3-5 days. If you develop fevers, difficulty breathing, or are just not getting better, please come back or go to the emergency room.

## 2015-10-07 NOTE — ED Notes (Signed)
Cough, reports clear phlegm.  C/o sweating, denies runny nose, denies sore throat.  Reports he is eating and drinking like usual.  Reports having symptoms for 4-5 days.

## 2015-10-07 NOTE — ED Provider Notes (Signed)
CSN: [id number]     Arrival date & time 10/07/15  1726 History   First MD Initiated Contact with Patient 10/07/15 1813     Chief Complaint  Patient presents with  . Cough   (Consider location/radiation/quality/duration/timing/severity/associated sxs/prior Treatment) HPI  He is a 74 year old man here for evaluation of cough. His symptoms started 4-5 days ago. He reports an intermittent cough. He denies any shortness of breath. He has heard some wheezing. He reports some sweats, but no fever or chills. He denies any nasal congestion, rhinorrhea, sore throat. No nausea or vomiting. He has been taking an over-the-counter cough medicine with good improvement.  He is a current smoker. He smokes about 1 pack per day.  Past Medical History  Diagnosis Date  . COPD (chronic obstructive pulmonary disease) ([place])   . Vitamin D deficiency   . Hyperlipidemia   . Prostatitis   . Elevated PSA   . Prostate cancer ([place]) 02/27/13    gleason 6, volume 133 cc  . Hx of radiation therapy 09/25/13- 11/27/13    prostate 7800 cGy 39 sessions   Past Surgical History  Procedure Laterality Date  . Tonsillectomy    . Prostate biopsy  02/27/2013   Family History  Problem Relation Age of Onset  . Prostate cancer     Social History  Substance Use Topics  . Smoking status: Former Smoker -- 1.00 packs/day for 104 years    Quit date: 11/22/2012  . Smokeless tobacco: None  . Alcohol Use: None    Review of Systems As in history of present illness Allergies  Penicillins  Home Medications   Prior to Admission medications   Medication Sig Start Date End Date Taking? Authorizing Provider  aspirin 81 MG tablet Take 81 mg by mouth daily. 2 po daily    Historical Provider, MD  doxycycline (VIBRAMYCIN) 100 MG capsule Take 1 capsule (100 mg total) by mouth 2 (two) times daily. 10/07/15   Melony Overly, MD  finasteride (PROSCAR) 5 MG tablet Take 5 mg by mouth daily.    Historical Provider, MD  Multiple Vitamin  (MULTIVITAMIN) tablet Take 1 tablet by mouth daily.    Historical Provider, MD  predniSONE (DELTASONE) 50 MG tablet Take 1 pill daily for 5 days. 10/07/15   Melony Overly, MD  silodosin (RAPAFLO) 4 MG CAPS capsule Take 8 mg by mouth daily with breakfast.    Historical Provider, MD  tamsulosin (FLOMAX) 0.4 MG CAPS capsule Take 0.4 mg by mouth daily after breakfast.    Historical Provider, MD   Meds Ordered and Administered this Visit  Medications - No data to display  BP 124/80 mmHg  Pulse 62  Temp(Src) 98.4 F (36.9 C) (Oral)  Resp 16  SpO2 96% No data found.   Physical Exam  Constitutional: He is oriented to person, place, and time. He appears well-developed and well-nourished. No distress.  Neck: Neck supple.  Cardiovascular: Normal rate, regular rhythm and normal heart sounds.   No murmur heard. Pulmonary/Chest: Effort normal. No respiratory distress. He has wheezes (rare expiratory wheeze in the right lung fields).  Coarse crackles in right midlung.  Neurological: He is alert and oriented to person, place, and time.    ED Course  Procedures (including critical care time)  Labs Review Labs Reviewed - No data to display  Imaging Review Dg Chest 2 View  10/07/2015  CLINICAL DATA:  Cough for 5 days EXAM: CHEST - 2 VIEW COMPARISON:  12/04/2012 FINDINGS: Cardiac shadow is  stable. Lungs are hyperinflated consistent with COPD. No focal infiltrate or sizable effusion is noted. Bony structures are within normal limits. IMPRESSION: COPD without acute abnormality. Electronically Signed   By: Inez Catalina M.D.   On: 10/07/2015 19:00      MDM   1. Bronchitis    Treat with doxycycline and prednisone. Return precautions reviewed.    Melony Overly, MD 10/07/15 [phone]

## 2019-09-19 ENCOUNTER — Other Ambulatory Visit: Payer: Self-pay

## 2019-09-19 ENCOUNTER — Ambulatory Visit (INDEPENDENT_AMBULATORY_CARE_PROVIDER_SITE_OTHER): Payer: Medicare Other

## 2019-09-19 ENCOUNTER — Encounter (HOSPITAL_COMMUNITY): Payer: Self-pay

## 2019-09-19 ENCOUNTER — Inpatient Hospital Stay (HOSPITAL_COMMUNITY)
Admission: EM | Admit: 2019-09-19 | Discharge: 2019-10-05 | DRG: 329 | Disposition: A | Discharge status: Skilled Nursing Facility | Payer: Medicare Other | Attending: Internal Medicine | Admitting: Internal Medicine

## 2019-09-19 ENCOUNTER — Ambulatory Visit (INDEPENDENT_AMBULATORY_CARE_PROVIDER_SITE_OTHER)
Admission: EM | Admit: 2019-09-19 | Discharge: 2019-09-19 | Disposition: A | Discharge status: Home / Self Care | Payer: Medicare Other | Source: Home / Self Care | Attending: Family Medicine | Admitting: Family Medicine

## 2019-09-19 ENCOUNTER — Encounter (HOSPITAL_COMMUNITY): Payer: Self-pay | Admitting: Emergency Medicine

## 2019-09-19 ENCOUNTER — Emergency Department (HOSPITAL_COMMUNITY): Payer: Medicare Other

## 2019-09-19 DIAGNOSIS — Z09 Encounter for follow-up examination after completed treatment for conditions other than malignant neoplasm: Secondary | ICD-10-CM | POA: Diagnosis not present

## 2019-09-19 DIAGNOSIS — N179 Acute kidney failure, unspecified: Secondary | ICD-10-CM | POA: Diagnosis present

## 2019-09-19 DIAGNOSIS — J449 Chronic obstructive pulmonary disease, unspecified: Secondary | ICD-10-CM | POA: Diagnosis present

## 2019-09-19 DIAGNOSIS — Z8546 Personal history of malignant neoplasm of prostate: Secondary | ICD-10-CM | POA: Diagnosis not present

## 2019-09-19 DIAGNOSIS — K9189 Other postprocedural complications and disorders of digestive system: Secondary | ICD-10-CM | POA: Diagnosis not present

## 2019-09-19 DIAGNOSIS — R160 Hepatomegaly, not elsewhere classified: Secondary | ICD-10-CM | POA: Diagnosis present

## 2019-09-19 DIAGNOSIS — C61 Malignant neoplasm of prostate: Secondary | ICD-10-CM | POA: Diagnosis present

## 2019-09-19 DIAGNOSIS — Z20828 Contact with and (suspected) exposure to other viral communicable diseases: Secondary | ICD-10-CM | POA: Diagnosis present

## 2019-09-19 DIAGNOSIS — Z515 Encounter for palliative care: Secondary | ICD-10-CM | POA: Diagnosis not present

## 2019-09-19 DIAGNOSIS — L8990 Pressure ulcer of unspecified site, unspecified stage: Secondary | ICD-10-CM | POA: Insufficient documentation

## 2019-09-19 DIAGNOSIS — E785 Hyperlipidemia, unspecified: Secondary | ICD-10-CM | POA: Diagnosis present

## 2019-09-19 DIAGNOSIS — F17210 Nicotine dependence, cigarettes, uncomplicated: Secondary | ICD-10-CM | POA: Diagnosis present

## 2019-09-19 DIAGNOSIS — Z880 Allergy status to penicillin: Secondary | ICD-10-CM

## 2019-09-19 DIAGNOSIS — K567 Ileus, unspecified: Secondary | ICD-10-CM | POA: Diagnosis not present

## 2019-09-19 DIAGNOSIS — C20 Malignant neoplasm of rectum: Secondary | ICD-10-CM | POA: Diagnosis present

## 2019-09-19 DIAGNOSIS — R140 Abdominal distension (gaseous): Secondary | ICD-10-CM | POA: Diagnosis not present

## 2019-09-19 DIAGNOSIS — Z6823 Body mass index (BMI) 23.0-23.9, adult: Secondary | ICD-10-CM | POA: Diagnosis not present

## 2019-09-19 DIAGNOSIS — Z923 Personal history of irradiation: Secondary | ICD-10-CM

## 2019-09-19 DIAGNOSIS — E870 Hyperosmolality and hypernatremia: Secondary | ICD-10-CM | POA: Diagnosis not present

## 2019-09-19 DIAGNOSIS — E43 Unspecified severe protein-calorie malnutrition: Secondary | ICD-10-CM | POA: Diagnosis present

## 2019-09-19 DIAGNOSIS — Z7189 Other specified counseling: Secondary | ICD-10-CM | POA: Diagnosis not present

## 2019-09-19 DIAGNOSIS — C787 Secondary malignant neoplasm of liver and intrahepatic bile duct: Secondary | ICD-10-CM | POA: Diagnosis present

## 2019-09-19 DIAGNOSIS — R338 Other retention of urine: Secondary | ICD-10-CM | POA: Diagnosis not present

## 2019-09-19 DIAGNOSIS — Z8042 Family history of malignant neoplasm of prostate: Secondary | ICD-10-CM | POA: Diagnosis not present

## 2019-09-19 DIAGNOSIS — D638 Anemia in other chronic diseases classified elsewhere: Secondary | ICD-10-CM | POA: Diagnosis present

## 2019-09-19 DIAGNOSIS — K6389 Other specified diseases of intestine: Secondary | ICD-10-CM | POA: Diagnosis not present

## 2019-09-19 DIAGNOSIS — Z0189 Encounter for other specified special examinations: Secondary | ICD-10-CM

## 2019-09-19 DIAGNOSIS — E860 Dehydration: Secondary | ICD-10-CM | POA: Diagnosis not present

## 2019-09-19 DIAGNOSIS — Z4659 Encounter for fitting and adjustment of other gastrointestinal appliance and device: Secondary | ICD-10-CM

## 2019-09-19 DIAGNOSIS — E876 Hypokalemia: Secondary | ICD-10-CM | POA: Diagnosis not present

## 2019-09-19 DIAGNOSIS — N401 Enlarged prostate with lower urinary tract symptoms: Secondary | ICD-10-CM | POA: Diagnosis not present

## 2019-09-19 DIAGNOSIS — L89152 Pressure ulcer of sacral region, stage 2: Secondary | ICD-10-CM | POA: Diagnosis not present

## 2019-09-19 DIAGNOSIS — K56609 Unspecified intestinal obstruction, unspecified as to partial versus complete obstruction: Secondary | ICD-10-CM

## 2019-09-19 LAB — CBC
HCT: 37.8 % — ABNORMAL LOW (ref 39.0–52.0)
Hemoglobin: 12.4 g/dL — ABNORMAL LOW (ref 13.0–17.0)
MCH: 31.2 pg (ref 26.0–34.0)
MCHC: 32.8 g/dL (ref 30.0–36.0)
MCV: 95 fL (ref 80.0–100.0)
Platelets: 372 10*3/uL (ref 150–400)
RBC: 3.98 MIL/uL — ABNORMAL LOW (ref 4.22–5.81)
RDW: 13.2 % (ref 11.5–15.5)
WBC: 16.1 10*3/uL — ABNORMAL HIGH (ref 4.0–10.5)
nRBC: 0 % (ref 0.0–0.2)

## 2019-09-19 LAB — COMPREHENSIVE METABOLIC PANEL
ALT: 24 U/L (ref 0–44)
AST: 31 U/L (ref 15–41)
Albumin: 2.8 g/dL — ABNORMAL LOW (ref 3.5–5.0)
Alkaline Phosphatase: 108 U/L (ref 38–126)
Anion gap: 15 (ref 5–15)
BUN: 19 mg/dL (ref 8–23)
CO2: 23 mmol/L (ref 22–32)
Calcium: 8.8 mg/dL — ABNORMAL LOW (ref 8.9–10.3)
Chloride: 100 mmol/L (ref 98–111)
Creatinine, Ser: 1.28 mg/dL — ABNORMAL HIGH (ref 0.61–1.24)
GFR calc Af Amer: >60 mL/min (ref >60)
GFR calc non Af Amer: 53 mL/min — ABNORMAL LOW (ref >60)
Glucose, Bld: 108 mg/dL — ABNORMAL HIGH (ref 70–99)
Potassium: 3.8 mmol/L (ref 3.5–5.1)
Sodium: 138 mmol/L (ref 135–145)
Total Bilirubin: 0.9 mg/dL (ref 0.3–1.2)
Total Protein: 7.2 g/dL (ref 6.5–8.1)

## 2019-09-19 LAB — URINALYSIS, ROUTINE W REFLEX MICROSCOPIC
Bilirubin Urine: NEGATIVE
Glucose, UA: NEGATIVE mg/dL
Ketones, ur: NEGATIVE mg/dL
Leukocytes,Ua: NEGATIVE
Nitrite: NEGATIVE
Protein, ur: 100 mg/dL — AB
Specific Gravity, Urine: 1.021 (ref 1.005–1.030)
pH: 5 (ref 5.0–8.0)

## 2019-09-19 LAB — LIPASE, BLOOD: Lipase: 21 U/L (ref 11–51)

## 2019-09-19 LAB — SARS CORONAVIRUS 2 BY RT PCR (HOSPITAL ORDER, PERFORMED IN ~~LOC~~ HOSPITAL LAB): SARS Coronavirus 2: NEGATIVE

## 2019-09-19 MED ORDER — SODIUM CHLORIDE 0.9% FLUSH
3.0000 mL | Freq: Once | INTRAVENOUS | Status: AC
  Administered 2019-09-19: 3 mL via INTRAVENOUS

## 2019-09-19 MED ORDER — ONDANSETRON HCL 4 MG/2ML IJ SOLN: 4.0000 mg | Freq: Four times a day (QID) | INTRAMUSCULAR | Status: DC | PRN

## 2019-09-19 MED ORDER — IOHEXOL 300 MG/ML  SOLN
100.0000 mL | Freq: Once | INTRAMUSCULAR | Status: AC | PRN
  Administered 2019-09-19: 100 mL via INTRAVENOUS

## 2019-09-19 MED ORDER — ONDANSETRON HCL 4 MG PO TABS: 4.0000 mg | ORAL_TABLET | Freq: Four times a day (QID) | ORAL | Status: DC | PRN

## 2019-09-19 MED ORDER — ACETAMINOPHEN 325 MG PO TABS
650.0000 mg | ORAL_TABLET | Freq: Four times a day (QID) | ORAL | Status: DC | PRN
  Administered 2019-09-20: 21:00:00 650 mg via ORAL
  Filled 2019-09-19: qty 2

## 2019-09-19 MED ORDER — METRONIDAZOLE IN NACL 5-0.79 MG/ML-% IV SOLN
500.0000 mg | Freq: Once | INTRAVENOUS | Status: AC
  Administered 2019-09-20: 500 mg via INTRAVENOUS
  Filled 2019-09-19: qty 100

## 2019-09-19 MED ORDER — ACETAMINOPHEN 650 MG RE SUPP: 650.0000 mg | Freq: Four times a day (QID) | RECTAL | Status: DC | PRN

## 2019-09-19 MED ORDER — MORPHINE SULFATE (PF) 2 MG/ML IV SOLN
2.0000 mg | INTRAVENOUS | Status: DC | PRN
  Administered 2019-09-21 (×5): 2 mg via INTRAVENOUS
  Filled 2019-09-19 (×5): qty 1

## 2019-09-19 MED ORDER — SODIUM CHLORIDE 0.9 % IV SOLN
1.0000 g | Freq: Three times a day (TID) | INTRAVENOUS | Status: DC
  Administered 2019-09-20 – 2019-09-25 (×15): 1 g via INTRAVENOUS
  Filled 2019-09-19 (×19): qty 1

## 2019-09-19 MED ORDER — SODIUM CHLORIDE 0.9 % IV SOLN
INTRAVENOUS | Status: DC
  Administered 2019-09-20 – 2019-09-23 (×6): via INTRAVENOUS

## 2019-09-19 MED ORDER — SODIUM CHLORIDE 0.9 % IV SOLN
2.0000 g | Freq: Once | INTRAVENOUS | Status: AC
  Administered 2019-09-19: 2 g via INTRAVENOUS
  Filled 2019-09-19: qty 2

## 2019-09-19 MED ORDER — METRONIDAZOLE IN NACL 5-0.79 MG/ML-% IV SOLN
500.0000 mg | Freq: Three times a day (TID) | INTRAVENOUS | Status: DC
  Administered 2019-09-20 – 2019-09-27 (×23): 500 mg via INTRAVENOUS
  Filled 2019-09-19 (×21): qty 100

## 2019-09-19 NOTE — ED Triage Notes (Signed)
Pt sent here from UC for evaluation of abd pain and swelling. UC diagnosed with Volvulus. Pt denies n/v

## 2019-09-19 NOTE — ED Notes (Signed)
Patient transported to CT 

## 2019-09-19 NOTE — ED Notes (Signed)
Urine Culture sent with UA 

## 2019-09-19 NOTE — ED Notes (Signed)
Charles Espinoza [id number] Cell [phone]

## 2019-09-19 NOTE — ED Provider Notes (Signed)
[facility] EMERGENCY DEPARTMENT Provider Note   CSN: [id number] Arrival date & time: 09/19/19  1632     History   Chief Complaint Chief Complaint  Patient presents with   Abdominal Pain    HPI Charles Espinoza is a 78 y.o. male with past medical history significant for COPD, prostate cancer s/p radiation completed in 12/2013 presents emergency department today with chief complaint of abdominal pain.  He states he has had generalized abdominal pain for 1 month.  However over the last week he has noticed his abdomen has become increasingly distended.  Reporting pain today localized to the umbilicus.  He describes as a constant ache, he rates the pain 6 of 10 in severity.  He not take anything for pain prior to arrival. He reports associated loose watery stool with one episode earlier this morning.  He admits to passing flatus.  He denies any sick history of constipation, nausea, vomiting.  He is urinating without difficulty.  Also denies fever, chills, chest pain, shortness of breath, blood in stool, alcohol intake, abdominal surgical history. Pt's oncologist was Dr. Valere Dross.  Patient went to urgent care prior to arrival where he had an x-ray that showed colonic ileus and possible rectal obstruction.   Past Medical History:  Diagnosis Date   COPD (chronic obstructive pulmonary disease) ([facility])    Elevated PSA    Hx of radiation therapy 09/25/13- 11/27/13   prostate 7800 cGy 39 sessions   Hyperlipidemia    Prostate cancer ([place]) 02/27/13   gleason 6, volume 133 cc   Prostatitis    Vitamin D deficiency     Patient Active Problem List   Diagnosis Date Noted   Malignant neoplasm of prostate ([place]) 08/15/2013    Past Surgical History:  Procedure Laterality Date   PROSTATE BIOPSY  02/27/2013   TONSILLECTOMY          Home Medications    Prior to Admission medications   Medication Sig Start Date End Date Taking? Authorizing Provider  Difluprednate  (DUREZOL) 0.05 % EMUL Place 1 drop into the left eye daily.   Yes [provider]    Family History Family History  Problem Relation Age of Onset   Prostate cancer Other     Social History Social History   Tobacco Use   Smoking status: Current Every Day Smoker    Packs/day: 1.00    Years: 57.00    Pack years: 57.00    Last attempt to quit: 11/22/2012    Years since quitting: 6.8  Substance Use Topics   Alcohol use: Never    Frequency: Never   Drug use: Yes    Types: Marijuana     Allergies   Penicillins   Review of Systems Review of Systems  Constitutional: Negative for appetite change, chills, fever and unexpected weight change.  HENT: Negative for congestion, rhinorrhea, sinus pressure and sore throat.   Eyes: Negative for pain and redness.  Respiratory: Negative for cough, shortness of breath and wheezing.   Cardiovascular: Negative for chest pain and palpitations.  Gastrointestinal: Positive for abdominal distention, abdominal pain and diarrhea. Negative for constipation, nausea and vomiting.  Genitourinary: Negative for dysuria.  Musculoskeletal: Negative for arthralgias, back pain, myalgias and neck pain.  Skin: Negative for rash and wound.  Neurological: Negative for dizziness, syncope, weakness, numbness and headaches.  Psychiatric/Behavioral: Negative for confusion.     Physical Exam Updated Vital Signs BP 128/89    Pulse 76    Temp 98.9  F (37.2 C) (Oral)    Resp 16    SpO2 99%   Physical Exam Vitals signs and nursing note reviewed.  Constitutional:      General: He is not in acute distress.    Appearance: He is not ill-appearing.  HENT:     Head: Normocephalic and atraumatic.     Right Ear: Tympanic membrane and external ear normal.     Left Ear: Tympanic membrane and external ear normal.     Nose: Nose normal.     Mouth/Throat:     Mouth: Mucous membranes are moist.     Pharynx: Oropharynx is clear.  Eyes:     General: No  scleral icterus.       Right eye: No discharge.        Left eye: No discharge.     Extraocular Movements: Extraocular movements intact.     Conjunctiva/sclera: Conjunctivae normal.     Pupils: Pupils are equal, round, and reactive to light.  Neck:     Musculoskeletal: Normal range of motion.     Vascular: No JVD.  Cardiovascular:     Rate and Rhythm: Normal rate and regular rhythm.     Pulses: Normal pulses.          Radial pulses are 2+ on the right side and 2+ on the left side.     Heart sounds: Normal heart sounds.  Pulmonary:     Comments: Lungs clear to auscultation in all fields. Symmetric chest rise. No wheezing, rales, or rhonchi. Abdominal:     General: Bowel sounds are normal.     Comments: Abdomen is distended.  He has tenderness to umbilical area.. No rigidity, no guarding. No peritoneal signs.  Musculoskeletal: Normal range of motion.  Skin:    General: Skin is warm and dry.     Capillary Refill: Capillary refill takes less than 2 seconds.  Neurological:     Mental Status: He is oriented to person, place, and time.     GCS: GCS eye subscore is 4. GCS verbal subscore is 5. GCS motor subscore is 6.     Comments: Fluent speech, no facial droop.  Psychiatric:        Behavior: Behavior normal.      ED Treatments / Results  Labs (all labs ordered are listed, but only abnormal results are displayed) Labs Reviewed  COMPREHENSIVE METABOLIC PANEL - Abnormal; Notable for the following components:      Result Value   Glucose, Bld 108 (*)    Creatinine, Ser 1.28 (*)    Calcium 8.8 (*)    Albumin 2.8 (*)    GFR calc non Af Amer 53 (*)    All other components within normal limits  CBC - Abnormal; Notable for the following components:   WBC 16.1 (*)    RBC 3.98 (*)    Hemoglobin 12.4 (*)    HCT 37.8 (*)    All other components within normal limits  URINALYSIS, ROUTINE W REFLEX MICROSCOPIC - Abnormal; Notable for the following components:   Color, Urine AMBER (*)     APPearance HAZY (*)    Hgb urine dipstick MODERATE (*)    Protein, ur 100 (*)    Bacteria, UA RARE (*)    All other components within normal limits  SARS CORONAVIRUS 2 (HOSPITAL ORDER, [facility])  LIPASE, BLOOD    EKG None  Radiology Ct Abdomen Pelvis W Contrast  Result Date: 09/19/2019 CLINICAL DATA:  78 year old male with abdominal pain and swelling. EXAM: CT ABDOMEN AND PELVIS WITH CONTRAST TECHNIQUE: Multidetector CT imaging of the abdomen and pelvis was performed using the standard protocol following bolus administration of intravenous contrast. CONTRAST:  116mL OMNIPAQUE IOHEXOL 300 MG/ML  SOLN COMPARISON:  Abdominal radiograph dated 09/19/2019 and CT of the abdomen pelvis dated 05/17/2013 FINDINGS: Lower chest: The visualized lung bases are clear. There is mild emphysema. No intra-abdominal free air. No free fluid. Hepatobiliary: There is a 7.5 x 7.0 cm heterogeneously enhancing mass in the right lobe of the liver (segment VII). This mass is suboptimally characterized but concerning for a malignancy such as hepatocellular carcinoma. Further evaluation with MRI without and with contrast a nonemergent basis recommended. No intrahepatic biliary ductal dilatation. The gallbladder is unremarkable. Pancreas: Pancreas is grossly unremarkable as visualized. Spleen: Normal in size without focal abnormality. Adrenals/Urinary Tract: The adrenal glands are unremarkable. There is a 5 mm right renal inferior pole nonobstructing stone. There is no hydronephrosis on either side. Bilateral renal cysts measure up to 2.5 cm in the interpolar aspect of the right kidney. There is a punctate nonobstructing left renal upper pole calculus. There is symmetric enhancement and excretion of contrast by both kidneys. There is diffuse thickened appearance of bladder wall which may be partly related to underdistention or represent cystitis. An infiltrative process is not excluded. Stomach/Bowel:  There is segmental thickening of the sigmoid colon. There is a 7.3 x 4.5 cm collection containing debris and air within the pelvis posterior to the urinary bladder and anterior to the rectosigmoid. There is mild diffuse inflammatory changes of the pelvis and perirectal fat. This complex collection is not well characterized but may represent an abscess, and inflamed large diverticula, or a malignancy with a large ulceration. There is distention of the colon with air and stool proximal to the thickened segment of the sigmoid colon. The cecum is located in the mid abdomen. No evidence of twisting or volvulus. There is no definite evidence of small-bowel obstruction. The appendix is normal. Vascular/Lymphatic: There is moderate aortoiliac atherosclerotic disease. The IVC is unremarkable. No portal venous gas. There is no adenopathy. Reproductive: Mildly enlarged prostate gland with multiple fiducial markers. Prostate measures approximately 5.2 cm in transverse axial diameter. The seminal vesicles are symmetric. Other: There is loss of subcutaneous and mesenteric fat and cachexia. Musculoskeletal: No acute or significant osseous findings. IMPRESSION: 1. Segmental thickening of the sigmoid colon with adjacent inflammatory changes. A 7.3 x 4.5 cm complex collection containing debris and air may represent an abscess or a large malignant ulceration. CT of the pelvis with rectal contrast may provide better evaluation. The thickened appearance of the sigmoid colon may be infectious or inflammatory in etiology or represent infiltrative neoplasm. Further evaluation with sigmoidoscopy following resolution of acute inflammatory changes recommended. 2. Diffuse distention of the colon proximal to the thickened segment of sigmoid. No valvular. No evidence of small-bowel obstruction. Normal appendix. 3. A 7.5 x 7.0 cm heterogeneously enhancing mass in the right lobe of the liver most consistent with malignancy such as hepatocellular  carcinoma. Further evaluation with MRI without and with contrast a nonemergent basis recommended. 4. Aortic Atherosclerosis (ICD10-I70.0). 5. Cachexia. Electronically Signed   By: Anner Crete M.D.   On: 09/19/2019 21:15   Dg Abd 2 Views  Result Date: 09/19/2019 CLINICAL DATA:  Distended abdomen EXAM: ABDOMEN - 2 VIEW COMPARISON:  None. FINDINGS: Markedly distended colon extending to the rectum. Mild amount of small bowel gas. No free air. No  abnormal calcifications or skeletal lesion. IMPRESSION: Markedly distended colon to the rectum. Findings most compatible with colonic ileus although rectal obstruction possible. Electronically Signed   By: Franchot Gallo M.D.   On: 09/19/2019 16:01    Procedures Procedures (including critical care time)  Medications Ordered in ED Medications  sodium chloride flush (NS) 0.9 % injection 3 mL (3 mLs Intravenous Given 09/19/19 1943)  iohexol (OMNIPAQUE) 300 MG/ML solution 100 mL (100 mLs Intravenous Contrast Given 09/19/19 2039)     Initial Impression / Assessment and Plan / ED Course  I have reviewed the triage vital signs and the nursing notes.  Pertinent labs & imaging results that were available during my care of the patient were reviewed by me and considered in my medical decision making (see chart for details).  Patient seen and examined. Patient nontoxic appearing, in no apparent distress, vitals WNL.  On exam lungs are clear to auscultation in all fields.  Abdomen is significantly distended with normoactive bowel sounds.  He has tenderness palpation near umbilicus.  No peritoneal signs.  Work-up today significant for leukocytosis of 16.1.  Hemoglobin today is 12.4, the only prior to compare this to is years ago and it was 14.6. Lipase is within normal range. UA is without signs of infection but does have moderate blood.  Pt declines any pain medication. I reviewed note from urgent care. Today CT abdomen pelvis with multiple findings concerning for  both infection and malignancy. Radiologist read includes segmental thickening of the sigmoid colon with inflammatory changes as well as a 7.3 x 4.5 cm complex collection containing debris and air may represent an abscess or a large malignant ulceration. The thickened appearance of the sigmoid colon may be infectious or inflammatory in etiology or represent infiltrative neoplasm. Diffuse distention of the colon proximal to the thickened segment of sigmoid. No valvular. No evidence of small-bowel obstruction. A 7.5 x 7.0 cm heterogeneously enhancing mass in the right lobe of the liver most consistent with malignancy such as hepatocellular carcinoma.  Patient started on IV antibiotics for intra-abdominal infection.  He has penicillin allergy so flagyl and azactam ordered. The patient was discussed with by Dr. Francia Greaves who agrees with the treatment plan to admit. Case discussed with on-call surgeon Dr. Donne Hazel who recommends medical admission with surgery to consult. Spoke with Dr. Alcario Drought with hospitalist service who agrees to assume care of patient and bring into the hospital for further evaluation and management.    Portions of this note were generated with [employer]. Dictation errors may occur despite best attempts at proofreading.    Final Clinical Impressions(s) / ED Diagnoses   Final diagnoses:  Liver mass  Abdominal distension  Colonic mass    ED Discharge Orders    None       Flint Melter 09/19/19 2323    Valarie Merino, MD 09/20/19 2251

## 2019-09-19 NOTE — ED Notes (Signed)
PT verbalized understanding urine specimen is needed. PT stated he would try to go when he gets off the phone. This tech will check back in with PT in 10 min.

## 2019-09-19 NOTE — ED Provider Notes (Signed)
[place]    CSN: [id number] Arrival date & time: 09/19/19  1425      History   Chief Complaint Chief Complaint  Patient presents with  . Abdominal Pain    HPI Makaio Gunnerson is a 78 y.o. male.   Patient is an 78 year old male that presents today with abdominal bloating, distention and watery stools.  Symptoms have been constant and worsening over the past week or more.  He is passing gas.  Denies any significant history of constipation.  Denies any nausea, vomiting.  No troubles with urination.  History of prostate cancer.  No recent traveling or antibiotic use.  No blood in stool.  Does not drink alcohol.  Current everyday smoker. Decrease in appetite but drinking fluids.   ROS per HPI      Past Medical History:  Diagnosis Date  . COPD (chronic obstructive pulmonary disease) ([place])   . Elevated PSA   . Hx of radiation therapy 09/25/13- 11/27/13   prostate 7800 cGy 39 sessions  . Hyperlipidemia   . Prostate cancer ([place]) 02/27/13   gleason 6, volume 133 cc  . Prostatitis   . Vitamin D deficiency     Patient Active Problem List   Diagnosis Date Noted  . Malignant neoplasm of prostate ([place]) 08/15/2013    Past Surgical History:  Procedure Laterality Date  . PROSTATE BIOPSY  02/27/2013  . TONSILLECTOMY         Home Medications    Prior to Admission medications   Not on File    Family History Family History  Problem Relation Age of Onset  . Prostate cancer Other     Social History Social History   Tobacco Use  . Smoking status: Current Every Day Smoker    Packs/day: 1.00    Years: 57.00    Pack years: 57.00    Last attempt to quit: 11/22/2012    Years since quitting: 6.8  Substance Use Topics  . Alcohol use: Never    Frequency: Never  . Drug use: Yes    Types: Marijuana     Allergies   Penicillins   Review of Systems Review of Systems   Physical Exam Triage Vital Signs ED Triage Vitals  Enc Vitals Group     BP 09/19/19  1504 129/83     Pulse Rate 09/19/19 1504 (!) 104     Resp 09/19/19 1504 (!) 24     Temp 09/19/19 1504 98.4 F (36.9 C)     Temp Source 09/19/19 1504 Oral     SpO2 09/19/19 1504 99 %     Weight --      Height --      Head Circumference --      Peak Flow --      Pain Score 09/19/19 1500 8     Pain Loc --      Pain Edu? --      Excl. in [place]? --    No data found.  Updated Vital Signs BP 129/83 (BP Location: Left Arm)   Pulse (!) 104   Temp 98.4 F (36.9 C) (Oral)   Resp (!) 24   SpO2 99%   Visual Acuity Right Eye Distance:   Left Eye Distance:   Bilateral Distance:    Right Eye Near:   Left Eye Near:    Bilateral Near:     Physical Exam Vitals signs and nursing note reviewed.  Constitutional:      General: He is not in  acute distress.    Appearance: He is well-developed. He is not ill-appearing, toxic-appearing or diaphoretic.  HENT:     Head: Normocephalic and atraumatic.  Cardiovascular:     Rate and Rhythm: Normal rate and regular rhythm.  Pulmonary:     Effort: Pulmonary effort is normal.     Breath sounds: Normal breath sounds.  Abdominal:     General: Bowel sounds are decreased. There is distension.     Palpations: Abdomen is soft.     Tenderness: There is generalized abdominal tenderness.     Hernia: No hernia is present.  Skin:    General: Skin is warm and dry.  Neurological:     Mental Status: He is alert.  Psychiatric:        Mood and Affect: Mood normal.      UC Treatments / Results  Labs (all labs ordered are listed, but only abnormal results are displayed) Labs Reviewed - No data to display  EKG   Radiology No results found.  Procedures Procedures (including critical care time)  Medications Ordered in UC Medications - No data to display  Initial Impression / Assessment and Plan / UC Course  I have reviewed the triage vital signs and the nursing notes.  Pertinent labs & imaging results that were available during my care of the  patient were reviewed by me and considered in my medical decision making (see chart for details).     78 year old with abdominal distention.  No significant pain X ray with colonic ileus and possible rectal obstruction.  Sending to the ER for further work up.   Final Clinical Impressions(s) / UC Diagnoses   Final diagnoses:  Ileus [facility])     Discharge Instructions     Go to the ER    ED Prescriptions    None     PDMP not reviewed this encounter.   Loura Halt A, NP 09/19/19 1629

## 2019-09-19 NOTE — H&P (Addendum)
History and Physical    Charles Espinoza [id number] DOB: 03/08/1941 DOA: 09/19/2019  PCP: Rogers Blocker, MD  Patient coming from: Home  I have personally briefly reviewed patient's old medical records in [place]  Chief Complaint: Abd pain  HPI: Charles Espinoza is a 78 y.o. male with medical history significant of prostate CA s/p radiation finished back in Jan 2015.  Patient presents to the ED with c/o generalized abd pain for the past 1 month.  Worsening especially over the past 1 eek with abd distention.  Pain around umbilicus today.  Constant symptoms.  Loose watery stool with one episode earlier this AM.  Is passing gas still.  No fever, SOB, CP, hematochezia.  Went to UC this AM, X ray shoed colonic illeus and possible volvulus, sent to ED.   ED Course: In ED, CT shows no volvulus, but does sho large 7.5x4.5 complex sigmoid collection: either abscess vs malignant ulceration.  Distention of colon proximal to said collection / mass.  Also has a 7.5x7.0 cm hepatic lesion c/w malignancy such as HCC.  WBC 16k.  Put on aztreonam and flagyl.  Gen surg consulted and asked medicine to admit.   Review of Systems: As per HPI, otherwise all review of systems negative.  Past Medical History:  Diagnosis Date   COPD (chronic obstructive pulmonary disease) ([facility])    Elevated PSA    Hx of radiation therapy 09/25/13- 11/27/13   prostate 7800 cGy 39 sessions   Hyperlipidemia    Prostate cancer ([place]) 02/27/13   gleason 6, volume 133 cc   Prostatitis    Vitamin D deficiency     Past Surgical History:  Procedure Laterality Date   PROSTATE BIOPSY  02/27/2013   TONSILLECTOMY       reports that he has been smoking. He has a 57.00 pack-year smoking history. He does not have any smokeless tobacco history on file. He reports current drug use. Drug: Marijuana. He reports that he does not drink alcohol.  Allergies  Allergen Reactions   Penicillins     Did it involve swelling  of the face/tongue/throat, SOB, or low BP? n/a Did it involve sudden or severe rash/hives, skin peeling, or any reaction on the inside of your mouth or nose? n/a Did you need to seek medical attention at a hospital or doctor's office? n/a When did it last happen?[id number] If all above answers are NO, may proceed with cephalosporin use.    Family History  Problem Relation Age of Onset   Prostate cancer Other      Prior to Admission medications   Medication Sig Start Date End Date Taking? Authorizing Provider  Difluprednate (DUREZOL) 0.05 % EMUL Place 1 drop into the left eye daily.   Yes [provider]    Physical Exam: Vitals:   09/19/19 1941 09/19/19 2000 09/19/19 2200 09/19/19 2251  BP: 128/89 (!) 135/92  103/63  Pulse: 76 73  84  Resp: 16 16  16   Temp:      TempSrc:      SpO2: 99% 99%  99%  Weight:   70.3 kg   Height:   5\' 8"  (1.727 m)     Constitutional: NAD, calm, comfortable Eyes: PERRL, lids and conjunctivae normal ENMT: Mucous membranes are moist. Posterior pharynx clear of any exudate or lesions.Normal dentition.  Neck: normal, supple, no masses, no thyromegaly Respiratory: clear to auscultation bilaterally, no wheezing, no crackles. Normal respiratory effort. No accessory muscle use.  Cardiovascular: Regular rate and  rhythm, no murmurs / rubs / gallops. No extremity edema. 2+ pedal pulses. No carotid bruits.  Abdomen: no tenderness, no masses palpated. No hepatosplenomegaly. Bowel sounds positive.  Musculoskeletal: no clubbing / cyanosis. No joint deformity upper and lower extremities. Good ROM, no contractures. Normal muscle tone.  Skin: no rashes, lesions, ulcers. No induration Neurologic: CN 2-12 grossly intact. Sensation intact, DTR normal. Strength 5/5 in all 4.  Psychiatric: Normal judgment and insight. Alert and oriented x 3. Normal mood.    Labs on Admission: I have personally reviewed following labs and imaging studies  CBC: Recent  Labs  Lab 09/19/19 1658  WBC 16.1*  HGB 12.4*  HCT 37.8*  MCV 95.0  PLT [id number]   Basic Metabolic Panel: Recent Labs  Lab 09/19/19 1658  NA 138  K 3.8  CL 100  CO2 23  GLUCOSE 108*  BUN 19  CREATININE 1.28*  CALCIUM 8.8*   GFR: Estimated Creatinine Clearance: 44.5 mL/min (A) (by C-G formula based on SCr of 1.28 mg/dL (H)). Liver Function Tests: Recent Labs  Lab 09/19/19 1658  AST 31  ALT 24  ALKPHOS 108  BILITOT 0.9  PROT 7.2  ALBUMIN 2.8*   Recent Labs  Lab 09/19/19 1658  LIPASE 21   No results for input(s): AMMONIA in the last 168 hours. Coagulation Profile: No results for input(s): INR, PROTIME in the last 168 hours. Cardiac Enzymes: No results for input(s): CKTOTAL, CKMB, CKMBINDEX, TROPONINI in the last 168 hours. BNP (last 3 results) No results for input(s): PROBNP in the last 8760 hours. HbA1C: No results for input(s): HGBA1C in the last 72 hours. CBG: No results for input(s): GLUCAP in the last 168 hours. Lipid Profile: No results for input(s): CHOL, HDL, LDLCALC, TRIG, CHOLHDL, LDLDIRECT in the last 72 hours. Thyroid Function Tests: No results for input(s): TSH, T4TOTAL, FREET4, T3FREE, THYROIDAB in the last 72 hours. Anemia Panel: No results for input(s): VITAMINB12, FOLATE, FERRITIN, TIBC, IRON, RETICCTPCT in the last 72 hours. Urine analysis:    Component Value Date/Time   COLORURINE AMBER (A) 09/19/2019 1920   APPEARANCEUR HAZY (A) 09/19/2019 1920   LABSPEC 1.021 09/19/2019 1920   PHURINE 5.0 09/19/2019 1920   GLUCOSEU NEGATIVE 09/19/2019 1920   HGBUR MODERATE (A) 09/19/2019 1920   BILIRUBINUR NEGATIVE 09/19/2019 1920   KETONESUR NEGATIVE 09/19/2019 1920   PROTEINUR 100 (A) 09/19/2019 1920   NITRITE NEGATIVE 09/19/2019 1920   LEUKOCYTESUR NEGATIVE 09/19/2019 1920    Radiological Exams on Admission: Ct Abdomen Pelvis W Contrast  Result Date: 09/19/2019 CLINICAL DATA:  78 year old male with abdominal pain and swelling. EXAM: CT  ABDOMEN AND PELVIS WITH CONTRAST TECHNIQUE: Multidetector CT imaging of the abdomen and pelvis was performed using the standard protocol following bolus administration of intravenous contrast. CONTRAST:  146mL OMNIPAQUE IOHEXOL 300 MG/ML  SOLN COMPARISON:  Abdominal radiograph dated 09/19/2019 and CT of the abdomen pelvis dated 05/17/2013 FINDINGS: Lower chest: The visualized lung bases are clear. There is mild emphysema. No intra-abdominal free air. No free fluid. Hepatobiliary: There is a 7.5 x 7.0 cm heterogeneously enhancing mass in the right lobe of the liver (segment VII). This mass is suboptimally characterized but concerning for a malignancy such as hepatocellular carcinoma. Further evaluation with MRI without and with contrast a nonemergent basis recommended. No intrahepatic biliary ductal dilatation. The gallbladder is unremarkable. Pancreas: Pancreas is grossly unremarkable as visualized. Spleen: Normal in size without focal abnormality. Adrenals/Urinary Tract: The adrenal glands are unremarkable. There is a 5 mm right renal  inferior pole nonobstructing stone. There is no hydronephrosis on either side. Bilateral renal cysts measure up to 2.5 cm in the interpolar aspect of the right kidney. There is a punctate nonobstructing left renal upper pole calculus. There is symmetric enhancement and excretion of contrast by both kidneys. There is diffuse thickened appearance of bladder wall which may be partly related to underdistention or represent cystitis. An infiltrative process is not excluded. Stomach/Bowel: There is segmental thickening of the sigmoid colon. There is a 7.3 x 4.5 cm collection containing debris and air within the pelvis posterior to the urinary bladder and anterior to the rectosigmoid. There is mild diffuse inflammatory changes of the pelvis and perirectal fat. This complex collection is not well characterized but may represent an abscess, and inflamed large diverticula, or a malignancy with  a large ulceration. There is distention of the colon with air and stool proximal to the thickened segment of the sigmoid colon. The cecum is located in the mid abdomen. No evidence of twisting or volvulus. There is no definite evidence of small-bowel obstruction. The appendix is normal. Vascular/Lymphatic: There is moderate aortoiliac atherosclerotic disease. The IVC is unremarkable. No portal venous gas. There is no adenopathy. Reproductive: Mildly enlarged prostate gland with multiple fiducial markers. Prostate measures approximately 5.2 cm in transverse axial diameter. The seminal vesicles are symmetric. Other: There is loss of subcutaneous and mesenteric fat and cachexia. Musculoskeletal: No acute or significant osseous findings. IMPRESSION: 1. Segmental thickening of the sigmoid colon with adjacent inflammatory changes. A 7.3 x 4.5 cm complex collection containing debris and air may represent an abscess or a large malignant ulceration. CT of the pelvis with rectal contrast may provide better evaluation. The thickened appearance of the sigmoid colon may be infectious or inflammatory in etiology or represent infiltrative neoplasm. Further evaluation with sigmoidoscopy following resolution of acute inflammatory changes recommended. 2. Diffuse distention of the colon proximal to the thickened segment of sigmoid. No valvular. No evidence of small-bowel obstruction. Normal appendix. 3. A 7.5 x 7.0 cm heterogeneously enhancing mass in the right lobe of the liver most consistent with malignancy such as hepatocellular carcinoma. Further evaluation with MRI without and with contrast a nonemergent basis recommended. 4. Aortic Atherosclerosis (ICD10-I70.0). 5. Cachexia. Electronically Signed   By: Anner Crete M.D.   On: 09/19/2019 21:15   Dg Abd 2 Views  Result Date: 09/19/2019 CLINICAL DATA:  Distended abdomen EXAM: ABDOMEN - 2 VIEW COMPARISON:  None. FINDINGS: Markedly distended colon extending to the rectum.  Mild amount of small bowel gas. No free air. No abnormal calcifications or skeletal lesion. IMPRESSION: Markedly distended colon to the rectum. Findings most compatible with colonic ileus although rectal obstruction possible. Electronically Signed   By: Franchot Gallo M.D.   On: 09/19/2019 16:01    EKG: Independently reviewed.  Assessment/Plan Principal Problem:   Colonic mass Active Problems:   Malignant neoplasm of prostate ([facility])   Liver mass   Mass of colon    1. Colonic mass - 1. Abscess vs malignant ulceration 2. Aztreonam + Flagyl 3. IVF: NS at 100 cc/hr 4. NPO - ? may be causing PSBO with dilation of colon proximal to this finding in sigmoid. 5. Gen surg to see patient. 6. Morphine PRN 7. Checking CEA 2. Liver mass - 1. Checking AFP 2. Getting MRI w and w/o of liver 3. Checking hepatitis pnl to look for chronic HBV, HCV evidence 3. H/o prostate CA - 1. Finished radiation back in Jan 2015 2. Checking PSA  given other intra abd findings  DVT prophylaxis: SCDs in case this is judged surgical Code Status: Full Family Communication: No family in room Disposition Plan: Home after admit Consults called: Dr. Donne Hazel Admission status: Admit to inpatient  Severity of Illness: The appropriate patient status for this patient is INPATIENT. Inpatient status is judged to be reasonable and necessary in order to provide the required intensity of service to ensure the patient's safety. The patient's presenting symptoms, physical exam findings, and initial radiographic and laboratory data in the context of their chronic comorbidities is felt to place them at high risk for further clinical deterioration. Furthermore, it is not anticipated that the patient will be medically stable for discharge from the hospital within 2 midnights of admission. The following factors support the patient status of inpatient.   IP status for new colonic mass: abscess vs malignant ulceration.   * I certify  that at the point of admission it is my clinical judgment that the patient will require inpatient hospital care spanning beyond 2 midnights from the point of admission due to high intensity of service, high risk for further deterioration and high frequency of surveillance required.*    Lorelle Macaluso M. DO [facility]  How to contact the [facility] Attending or Consulting provider [place] or covering provider during after hours [place], for this patient?  1. Check the care team in [facility] and look for a) attending/consulting [facility] provider listed and b) the [facility] team listed 2. Log into [email or web address]  Amion Physician Scheduling and messaging for groups and whole hospitals  On call and physician scheduling software for group practices, residents, hospitalists and other medical providers for call, clinic, rotation and shift schedules. OnCall [employer] is a hospital-wide system for scheduling doctors and paging doctors on call. EasyPlot is for scientific plotting and data analysis.  [email or web address]  and use [place]'s universal password to access. If you do not have the password, please contact the hospital operator.  3. Locate the [facility] provider you are looking for under [facility] and page to a number that you can be directly reached. 4. If you still have difficulty reaching the provider, please page the [facility] (Director on Call) for the Hospitalists listed on amion for assistance.  09/19/2019, 11:16 PM

## 2019-09-19 NOTE — Progress Notes (Signed)
Pharmacy Antibiotic Note  Charles Espinoza is a 78 y.o. male admitted on 09/19/2019 with abdominal pain. Pharmacy has been consulted for Azactam dosing for intra-abdominal infection.  No cephalosporin use in the past.  SCr 1.28, CrCL 44 ml/min, afebrile, WBC 16.1.  Plan: Azactam 2gm IV x 1, then 1gm IV Q8H Monitor renal fxn, clinical progress  Height: 5\' 8"  (172.7 cm) Weight: 155 lb (70.3 kg) IBW/kg (Calculated) : 68.4  Temp (24hrs), Avg:98.7 F (37.1 C), Min:98.4 F (36.9 C), Max:98.9 F (37.2 C)  Recent Labs  Lab 09/19/19 1658  WBC 16.1*  CREATININE 1.28*    Estimated Creatinine Clearance: 44.5 mL/min (A) (by C-G formula based on SCr of 1.28 mg/dL (H)).    Allergies  Allergen Reactions  . Penicillins     Did it involve swelling of the face/tongue/throat, SOB, or low BP? n/a Did it involve sudden or severe rash/hives, skin peeling, or any reaction on the inside of your mouth or nose? n/a Did you need to seek medical attention at a hospital or doctor's office? n/a When did it last happen?[id number] If all above answers are "NO", may proceed with cephalosporin use.    Azactam 10/1 >>  10/1 covid -   Charles Espinoza, PharmD, BCPS, [place] 09/19/2019, 10:45 PM

## 2019-09-19 NOTE — ED Triage Notes (Signed)
abdominal pain for a week.  No vomiting.  Intermittent water stool.  Denies pain with urination.  Denies urination issues

## 2019-09-19 NOTE — Discharge Instructions (Signed)
Go to the ER.

## 2019-09-20 ENCOUNTER — Inpatient Hospital Stay (HOSPITAL_COMMUNITY): Payer: Medicare Other

## 2019-09-20 DIAGNOSIS — R160 Hepatomegaly, not elsewhere classified: Secondary | ICD-10-CM

## 2019-09-20 DIAGNOSIS — K6389 Other specified diseases of intestine: Secondary | ICD-10-CM

## 2019-09-20 LAB — MRSA PCR SCREENING: MRSA by PCR: NEGATIVE

## 2019-09-20 LAB — CBC
HCT: 35.9 % — ABNORMAL LOW (ref 39.0–52.0)
Hemoglobin: 11.9 g/dL — ABNORMAL LOW (ref 13.0–17.0)
MCH: 30.8 pg (ref 26.0–34.0)
MCHC: 33.1 g/dL (ref 30.0–36.0)
MCV: 93 fL (ref 80.0–100.0)
Platelets: 350 10*3/uL (ref 150–400)
RBC: 3.86 MIL/uL — ABNORMAL LOW (ref 4.22–5.81)
RDW: 13.2 % (ref 11.5–15.5)
WBC: 14.9 10*3/uL — ABNORMAL HIGH (ref 4.0–10.5)
nRBC: 0 % (ref 0.0–0.2)

## 2019-09-20 LAB — BASIC METABOLIC PANEL
Anion gap: 12 (ref 5–15)
BUN: 19 mg/dL (ref 8–23)
CO2: 26 mmol/L (ref 22–32)
Calcium: 8.6 mg/dL — ABNORMAL LOW (ref 8.9–10.3)
Chloride: 100 mmol/L (ref 98–111)
Creatinine, Ser: 1.28 mg/dL — ABNORMAL HIGH (ref 0.61–1.24)
GFR calc Af Amer: >60 mL/min (ref >60)
GFR calc non Af Amer: 53 mL/min — ABNORMAL LOW (ref >60)
Glucose, Bld: 106 mg/dL — ABNORMAL HIGH (ref 70–99)
Potassium: 3.6 mmol/L (ref 3.5–5.1)
Sodium: 138 mmol/L (ref 135–145)

## 2019-09-20 LAB — HEPATITIS PANEL, ACUTE
HCV Ab: NONREACTIVE
Hep A IgM: NONREACTIVE
Hep B C IgM: NONREACTIVE
Hepatitis B Surface Ag: NONREACTIVE

## 2019-09-20 LAB — PSA: Prostatic Specific Antigen: 0.53 ng/mL (ref 0.00–4.00)

## 2019-09-20 MED ORDER — GADOBUTROL 1 MMOL/ML IV SOLN
7.0000 mL | Freq: Once | INTRAVENOUS | Status: AC | PRN
  Administered 2019-09-20: 7 mL via INTRAVENOUS

## 2019-09-20 NOTE — Progress Notes (Signed)
Pt new admit from ED Dx Colonic mass, alert and oriented, room air, distended abdomen, with minimal abdominal pain, with peripheral IV line,skin intact, with  face mask on, wife at the bedside.

## 2019-09-20 NOTE — ED Notes (Signed)
ED TO INPATIENT HANDOFF REPORT  ED Nurse Name and Phone #: Lovell Sheehan [phone]  S Name/Age/Gender Charles Espinoza 78 y.o. male Room/Bed: 025C/025C  Code Status   Code Status: Full Code  Home/SNF/Other Home Patient oriented to: self, place, time and situation Is this baseline? Yes   Triage Complete: Triage complete  Chief Complaint Stomach pain  Triage Note Pt sent here from UC for evaluation of abd pain and swelling. UC diagnosed with Volvulus. Pt denies n/v   Allergies Allergies  Allergen Reactions  . Penicillins     Did it involve swelling of the face/tongue/throat, SOB, or low BP? n/a Did it involve sudden or severe rash/hives, skin peeling, or any reaction on the inside of your mouth or nose? n/a Did you need to seek medical attention at a hospital or doctor's office? n/a When did it last happen?[id number] If all above answers are "NO", may proceed with cephalosporin use.    Level of Care/Admitting Diagnosis ED Disposition    ED Disposition Condition [facility] Area: [place] [[id number]]  Level of Care: Med-Surg [16]  Covid Evaluation: Asymptomatic Screening Protocol (No Symptoms)  Diagnosis: Mass of colon [id number]  Admitting Physician: Etta Quill [id number]  Attending Physician: Etta Quill [id number]  Estimated length of stay: past midnight tomorrow  Certification:: I certify this patient will need inpatient services for at least 2 midnights  PT Class (Do Not Modify): Inpatient [101]  PT Acc Code (Do Not Modify): Private [1]       B Medical/Surgery History Past Medical History:  Diagnosis Date  . COPD (chronic obstructive pulmonary disease) ([place])   . Elevated PSA   . Hx of radiation therapy 09/25/13- 11/27/13   prostate 7800 cGy 39 sessions  . Hyperlipidemia   . Prostate cancer ([place]) 02/27/13   gleason 6, volume 133 cc  . Prostatitis   . Vitamin D deficiency    Past Surgical History:  Procedure Laterality Date   . PROSTATE BIOPSY  02/27/2013  . TONSILLECTOMY       A IV Location/Drains/Wounds Patient Lines/Drains/Airways Status   Active Line/Drains/Airways    Name:   Placement date:   Placement time:   Site:   Days:   Peripheral IV 09/19/19 Right Antecubital   09/19/19    1943    Antecubital   1          Intake/Output Last 24 hours  Intake/Output Summary (Last 24 hours) at 09/20/2019 1504 Last data filed at 09/20/2019 0932 Gross per 24 hour  Intake 200 ml  Output -  Net 200 ml    Labs/Imaging Results for orders placed or performed during the hospital encounter of 09/19/19 (from the past 48 hour(s))  Lipase, blood     Status: None   Collection Time: 09/19/19  4:58 PM  Result Value Ref Range   Lipase 21 11 - 51 U/L    Comment: Performed at [facility], [place] [address], [place], [place] [address]  Comprehensive metabolic panel     Status: Abnormal   Collection Time: 09/19/19  4:58 PM  Result Value Ref Range   Sodium 138 135 - 145 mmol/L   Potassium 3.8 3.5 - 5.1 mmol/L   Chloride 100 98 - 111 mmol/L   CO2 23 22 - 32 mmol/L   Glucose, Bld 108 (H) 70 - 99 mg/dL   BUN 19 8 - 23 mg/dL   Creatinine, Ser 1.28 (H) 0.61 - 1.24 mg/dL  Calcium 8.8 (L) 8.9 - 10.3 mg/dL   Total Protein 7.2 6.5 - 8.1 g/dL   Albumin 2.8 (L) 3.5 - 5.0 g/dL   AST 31 15 - 41 U/L   ALT 24 0 - 44 U/L   Alkaline Phosphatase 108 38 - 126 U/L   Total Bilirubin 0.9 0.3 - 1.2 mg/dL   GFR calc non Af Amer 53 (L) >60 mL/min   GFR calc Af Amer >60 >60 mL/min   Anion gap 15 5 - 15    Comment: Performed at [facility] [address] [place], [place] [address]  CBC     Status: Abnormal   Collection Time: 09/19/19  4:58 PM  Result Value Ref Range   WBC 16.1 (H) 4.0 - 10.5 K/uL   RBC 3.98 (L) 4.22 - 5.81 MIL/uL   Hemoglobin 12.4 (L) 13.0 - 17.0 g/dL   HCT 37.8 (L) 39.0 - 52.0 %   MCV 95.0 80.0 - 100.0 fL   MCH 31.2 26.0 - 34.0 pg   MCHC 32.8 30.0 - 36.0 g/dL   RDW 13.2 11.5 - 15.5 %   Platelets  372 150 - 400 K/uL   nRBC 0.0 0.0 - 0.2 %    Comment: Performed at [facility], [place] [address], [place], [place] [address]  Urinalysis, Routine w reflex microscopic     Status: Abnormal   Collection Time: 09/19/19  7:20 PM  Result Value Ref Range   Color, Urine AMBER (A) YELLOW    Comment: BIOCHEMICALS MAY BE AFFECTED BY COLOR   APPearance HAZY (A) CLEAR   Specific Gravity, Urine 1.021 1.005 - 1.030   pH 5.0 5.0 - 8.0   Glucose, UA NEGATIVE NEGATIVE mg/dL   Hgb urine dipstick MODERATE (A) NEGATIVE   Bilirubin Urine NEGATIVE NEGATIVE   Ketones, ur NEGATIVE NEGATIVE mg/dL   Protein, ur 100 (A) NEGATIVE mg/dL   Nitrite NEGATIVE NEGATIVE   Leukocytes,Ua NEGATIVE NEGATIVE   RBC / HPF 6-10 0 - 5 RBC/hpf   WBC, UA 0-5 0 - 5 WBC/hpf   Bacteria, UA RARE (A) NONE SEEN   Squamous Epithelial / LPF 0-5 0 - 5   Mucus PRESENT    Hyaline Casts, UA PRESENT    Granular Casts, UA PRESENT     Comment: Performed at [facility], [address], [place], [place] [address]  SARS Coronavirus 2 [facility] order, Performed in [facility] hospital lab) Nasopharyngeal Nasopharyngeal Swab     Status: None   Collection Time: 09/19/19  9:43 PM   Specimen: Nasopharyngeal Swab  Result Value Ref Range   SARS Coronavirus 2 NEGATIVE NEGATIVE    Comment: (NOTE) If result is NEGATIVE SARS-CoV-2 target nucleic acids are NOT DETECTED. The SARS-CoV-2 RNA is generally detectable in upper and lower  respiratory specimens during the acute phase of infection. The lowest  concentration of SARS-CoV-2 viral copies this assay can detect is 250  copies / mL. A negative result does not preclude SARS-CoV-2 infection  and should not be used as the sole basis for treatment or other  patient management decisions.  A negative result may occur with  improper specimen collection / handling, submission of specimen other  than nasopharyngeal swab, presence of viral mutation(s) within the  areas targeted by this assay, and  inadequate number of viral copies  (<250 copies / mL). A negative result must be combined with clinical  observations, patient history, and epidemiological information. If result is POSITIVE SARS-CoV-2  target nucleic acids are DETECTED. The SARS-CoV-2 RNA is generally detectable in upper and lower  respiratory specimens dur ing the acute phase of infection.  Positive  results are indicative of active infection with SARS-CoV-2.  Clinical  correlation with patient history and other diagnostic information is  necessary to determine patient infection status.  Positive results do  not rule out bacterial infection or co-infection with other viruses. If result is PRESUMPTIVE POSTIVE SARS-CoV-2 nucleic acids MAY BE PRESENT.   A presumptive positive result was obtained on the submitted specimen  and confirmed on repeat testing.  While 2019 novel coronavirus  (SARS-CoV-2) nucleic acids may be present in the submitted sample  additional confirmatory testing may be necessary for epidemiological  and / or clinical management purposes  to differentiate between  SARS-CoV-2 and other Sarbecovirus currently known to infect humans.  If clinically indicated additional testing with an alternate test  methodology [phone]) is advised. The SARS-CoV-2 RNA is generally  detectable in upper and lower respiratory sp ecimens during the acute  phase of infection. The expected result is Negative. Fact Sheet for Patients:  StrictlyIdeas.no Fact Sheet for Healthcare Providers: BankingDealers.co.za This test is not yet approved or cleared by the [place] FDA and has been authorized for detection and/or diagnosis of SARS-CoV-2 by FDA under an Emergency Use Authorization (EUA).  This EUA will remain in effect (meaning this test can be used) for the duration of the COVID-19 declaration under Section 564(b)(1) of the Act, 21 U.S.C. section 360bbb-3(b)(1), unless the  authorization is terminated or revoked sooner. Performed at [facility], [place] [address] [place], [place] [address]   Hepatitis panel, acute     Status: None   Collection Time: 09/20/19 12:05 AM  Result Value Ref Range   Hepatitis B Surface Ag NON REACTIVE NON REACTIVE   HCV Ab NON REACTIVE NON REACTIVE    Comment: (NOTE) Nonreactive HCV antibody screen is consistent with no HCV infections,  unless recent infection is suspected or other evidence exists to indicate HCV infection.    Hep A IgM NON REACTIVE NON REACTIVE   Hep B C IgM NON REACTIVE NON REACTIVE    Comment: Performed at [facility], [place] [address] [place] [address] [place], [place] [address]  PSA     Status: None   Collection Time: 09/20/19 12:05 AM  Result Value Ref Range   Prostatic Specific Antigen 0.53 0.00 - 4.00 ng/mL    Comment: (NOTE) While PSA levels of <=4.0 ng/ml are reported as reference range, some men with levels below 4.0 ng/ml can have prostate cancer and many men with PSA above 4.0 ng/ml do not have prostate cancer.  Other tests such as free PSA, age specific reference ranges, PSA velocity and PSA doubling time may be helpful especially in men less than 62 years old. Performed at [facility], [place] [address], [place], [place] [address]   MRSA PCR Screening     Status: None   Collection Time: 09/20/19  1:25 AM   Specimen: Nasopharyngeal  Result Value Ref Range   MRSA by PCR NEGATIVE NEGATIVE    Comment:        The GeneXpert MRSA Assay (FDA approved for NASAL specimens only), is one component of a comprehensive MRSA colonization surveillance program. It is not intended to diagnose MRSA infection nor to guide or monitor treatment for MRSA infections. Performed at [facility], [place] [address] [place], [place] [address]   CBC  Status: Abnormal   Collection Time: 09/20/19  4:30 AM  Result Value Ref Range   WBC 14.9 (H) 4.0 - 10.5 K/uL   RBC 3.86 (L) 4.22 - 5.81 MIL/uL    Hemoglobin 11.9 (L) 13.0 - 17.0 g/dL   HCT 35.9 (L) 39.0 - 52.0 %   MCV 93.0 80.0 - 100.0 fL   MCH 30.8 26.0 - 34.0 pg   MCHC 33.1 30.0 - 36.0 g/dL   RDW 13.2 11.5 - 15.5 %   Platelets 350 150 - 400 K/uL   nRBC 0.0 0.0 - 0.2 %    Comment: Performed at [facility], [place] [address], [place], [place] [id number]  Basic metabolic panel     Status: Abnormal   Collection Time: 09/20/19  4:30 AM  Result Value Ref Range   Sodium 138 135 - 145 mmol/L   Potassium 3.6 3.5 - 5.1 mmol/L   Chloride 100 98 - 111 mmol/L   CO2 26 22 - 32 mmol/L   Glucose, Bld 106 (H) 70 - 99 mg/dL   BUN 19 8 - 23 mg/dL   Creatinine, Ser 1.28 (H) 0.61 - 1.24 mg/dL   Calcium 8.6 (L) 8.9 - 10.3 mg/dL   GFR calc non Af Amer 53 (L) >60 mL/min   GFR calc Af Amer >60 >60 mL/min   Anion gap 12 5 - 15    Comment: Performed at [facility] [address], [place], [place] [address]   Ct Chest Wo Contrast  Result Date: 09/20/2019 CLINICAL DATA:  Staging for colon carcinoma. EXAM: CT CHEST WITHOUT CONTRAST TECHNIQUE: Multidetector CT imaging of the chest was performed following the standard protocol without IV contrast. COMPARISON:  MRI of the abdomen, 09/20/2019. CT of the abdomen pelvis, 09/19/2019. FINDINGS: Cardiovascular: Heart is normal in size and configuration. No pericardial effusion. No coronary artery calcifications. Great vessels are normal in caliber. Minor aortic atherosclerotic calcifications. Mediastinum/Nodes: There is a lobulated masses extends from the AP window region of the mediastinum into the left upper lobe. It measures 6.1 x 4.9 cm transversely at the level of the AP window. It extends for a length of 8.5 cm obliquely from inferomedial to anterolateral. It abuts the left anteromedial pleural margin, which may be invaded. 1.6 cm predominantly hypoattenuating posterior left thyroid lobe nodule. No neck base or axillary masses or enlarged lymph nodes. No other mediastinal masses.  No mediastinal or  hilar adenopathy. Trachea and esophagus are unremarkable. Lungs/Pleura: Linear/reticular opacities extend from the left upper lobe mass to the anterior, anterolateral and anteromedial pleural margins. There are no other lung masses. No nodules. Linear opacities are noted in the right lower lobe consistent with atelectasis. Mild centrilobular emphysema. No evidence of pneumonia or pulmonary edema. No pleural effusion or pneumothorax. Upper Abdomen: Poorly defined mass in the right lobe of the liver better seen on the previous day's study. Distention of the visualized bowel prominently with gas. No acute findings. Musculoskeletal: Degenerative changes of the visualized lower cervical spine. No fracture or acute finding. No osteoblastic or osteolytic lesions. IMPRESSION: 1. Mass extends from the AP window of the mediastinum into the left upper lobe consistent with neoplastic disease, which could be primary or metastatic. 2. There are no other lung masses or nodules to support metastatic disease. 3. No acute findings. 4. Incidental finding a 1.6 cm thyroid nodule on the left. Depending on the outcome of the patient's other findings on this chest CT and prior abdomen and pelvis CT, further  evaluation with thyroid ultrasound. If patient is clinically hyperthyroid, consider nuclear medicine thyroid uptake and scan. 5. Mild centrilobular emphysema.  Mild aortic atherosclerosis. Aortic Atherosclerosis (ICD10-I70.0) and Emphysema (ICD10-J43.9). Electronically Signed   By: Lajean Manes M.D.   On: 09/20/2019 13:55   Ct Abdomen Pelvis W Contrast  Result Date: 09/19/2019 CLINICAL DATA:  78 year old male with abdominal pain and swelling. EXAM: CT ABDOMEN AND PELVIS WITH CONTRAST TECHNIQUE: Multidetector CT imaging of the abdomen and pelvis was performed using the standard protocol following bolus administration of intravenous contrast. CONTRAST:  111mL OMNIPAQUE IOHEXOL 300 MG/ML  SOLN COMPARISON:  Abdominal radiograph dated  09/19/2019 and CT of the abdomen pelvis dated 05/17/2013 FINDINGS: Lower chest: The visualized lung bases are clear. There is mild emphysema. No intra-abdominal free air. No free fluid. Hepatobiliary: There is a 7.5 x 7.0 cm heterogeneously enhancing mass in the right lobe of the liver (segment VII). This mass is suboptimally characterized but concerning for a malignancy such as hepatocellular carcinoma. Further evaluation with MRI without and with contrast a nonemergent basis recommended. No intrahepatic biliary ductal dilatation. The gallbladder is unremarkable. Pancreas: Pancreas is grossly unremarkable as visualized. Spleen: Normal in size without focal abnormality. Adrenals/Urinary Tract: The adrenal glands are unremarkable. There is a 5 mm right renal inferior pole nonobstructing stone. There is no hydronephrosis on either side. Bilateral renal cysts measure up to 2.5 cm in the interpolar aspect of the right kidney. There is a punctate nonobstructing left renal upper pole calculus. There is symmetric enhancement and excretion of contrast by both kidneys. There is diffuse thickened appearance of bladder wall which may be partly related to underdistention or represent cystitis. An infiltrative process is not excluded. Stomach/Bowel: There is segmental thickening of the sigmoid colon. There is a 7.3 x 4.5 cm collection containing debris and air within the pelvis posterior to the urinary bladder and anterior to the rectosigmoid. There is mild diffuse inflammatory changes of the pelvis and perirectal fat. This complex collection is not well characterized but may represent an abscess, and inflamed large diverticula, or a malignancy with a large ulceration. There is distention of the colon with air and stool proximal to the thickened segment of the sigmoid colon. The cecum is located in the mid abdomen. No evidence of twisting or volvulus. There is no definite evidence of small-bowel obstruction. The appendix is  normal. Vascular/Lymphatic: There is moderate aortoiliac atherosclerotic disease. The IVC is unremarkable. No portal venous gas. There is no adenopathy. Reproductive: Mildly enlarged prostate gland with multiple fiducial markers. Prostate measures approximately 5.2 cm in transverse axial diameter. The seminal vesicles are symmetric. Other: There is loss of subcutaneous and mesenteric fat and cachexia. Musculoskeletal: No acute or significant osseous findings. IMPRESSION: 1. Segmental thickening of the sigmoid colon with adjacent inflammatory changes. A 7.3 x 4.5 cm complex collection containing debris and air may represent an abscess or a large malignant ulceration. CT of the pelvis with rectal contrast may provide better evaluation. The thickened appearance of the sigmoid colon may be infectious or inflammatory in etiology or represent infiltrative neoplasm. Further evaluation with sigmoidoscopy following resolution of acute inflammatory changes recommended. 2. Diffuse distention of the colon proximal to the thickened segment of sigmoid. No valvular. No evidence of small-bowel obstruction. Normal appendix. 3. A 7.5 x 7.0 cm heterogeneously enhancing mass in the right lobe of the liver most consistent with malignancy such as hepatocellular carcinoma. Further evaluation with MRI without and with contrast a nonemergent basis recommended. 4. Aortic Atherosclerosis (  ICD10-I70.0). 5. Cachexia. Electronically Signed   By: Anner Crete M.D.   On: 09/19/2019 21:15   Mr Liver W Wo Contrast  Addendum Date: 09/20/2019   ADDENDUM REPORT: 09/20/2019 08:31 ADDENDUM: These results were called by telephone at the time of interpretation on 09/20/2019 at 8:30 am to provider Dr. Horris Latino , who verbally acknowledged these results. Low T2 signal likely relates to fibrotic changes within this lesion. Electronically Signed   By: Zetta Bills M.D.   On: 09/20/2019 08:31   Result Date: 09/20/2019 CLINICAL DATA:  History hepatic  malignancy presenting to the emergency department generalized abdominal pain, worsening past month. EXAM: MRI ABDOMEN WITHOUT AND WITH CONTRAST TECHNIQUE: Multiplanar multisequence MR imaging of the abdomen was performed both before and after the administration of intravenous contrast. CONTRAST:  79mL GADAVIST GADOBUTROL 1 MMOL/ML IV SOLN COMPARISON:  CT evaluation 09/20/2019. FINDINGS: Lower chest: No signs of pleural effusion or definite pulmonary abnormality. Hepatobiliary: 7.9 x 7.5 7.4 cm mass in the posterior right hepatic lobe is poorly enhancing, mainly in the periphery. Lesion displays mixed T2 signal. No signs chronic liver disease portal hypertension. Cholelithiasis. No biliary ductal dilation. Pancreas: No mass, inflammatory changes, or other parenchymal abnormality identified. Spleen:  Within normal limits in size and appearance. Adrenals/Urinary Tract: No masses identified. No evidence of hydronephrosis. Bilateral renal cysts. Stomach/Bowel: Bowel distention is similar to the previous exam. The sigmoid colon not assessed on the current examination. Vascular/Lymphatic: Patent abdominal vasculature. Retroaortic left renal vein. No adenopathy. Other:  None. Musculoskeletal: No signs acute musculoskeletal finding. IMPRESSION: Large right hepatic mass lesion which is poorly enhancing. Constellation of findings favors metastatic lesion from obstructing colon cancer leading to contained perforation adjacent to the sigmoid colon. Primary hepatic neoplasm is felt less likely. No signs of vascular invasion or adenopathy. The presence of susceptibility in the setting of bowel distension limits assessment. Electronically Signed: By: Zetta Bills M.D. On: 09/20/2019 07:59   Dg Abd 2 Views  Result Date: 09/19/2019 CLINICAL DATA:  Distended abdomen EXAM: ABDOMEN - 2 VIEW COMPARISON:  None. FINDINGS: Markedly distended colon extending to the rectum. Mild amount of small bowel gas. No free air. No abnormal  calcifications or skeletal lesion. IMPRESSION: Markedly distended colon to the rectum. Findings most compatible with colonic ileus although rectal obstruction possible. Electronically Signed   By: Franchot Gallo M.D.   On: 09/19/2019 16:01    Pending Labs Unresulted Labs (From admission, onward)    Start     Ordered   09/21/19 0500  CBC with Differential/Platelet  Daily,   R     09/20/19 1143   09/21/19 [id number]  Basic metabolic panel  Daily,   R     09/20/19 1143   09/20/19 0416  AFP tumor marker  Once,   STAT     09/20/19 0416   09/20/19 0416  CEA  Once,   STAT     09/20/19 0416   09/19/19 2317  AFP tumor marker  Once,   STAT     09/19/19 2316   09/19/19 2317  CEA  Once,   STAT     09/19/19 2316          Vitals/Pain Today's Vitals   09/20/19 1400 09/20/19 1415 09/20/19 1430 09/20/19 1445  BP: 129/84 135/74 124/82 121/83  Pulse: 66 66 68 68  Resp: (!) 23 (!) 22 (!) 22 (!) 22  Temp:      TempSrc:      SpO2: 98% 97% 98% 98%  Weight:      Height:      PainSc:        Isolation Precautions No active isolations  Medications Medications  aztreonam (AZACTAM) 1 g in sodium chloride 0.9 % 100 mL IVPB (0 g Intravenous Stopped 09/20/19 0932)  0.9 %  sodium chloride infusion ( Intravenous Rate/Dose Verify 09/20/19 1132)  acetaminophen (TYLENOL) tablet 650 mg (has no administration in time range)    Or  acetaminophen (TYLENOL) suppository 650 mg (has no administration in time range)  ondansetron (ZOFRAN) tablet 4 mg (has no administration in time range)    Or  ondansetron (ZOFRAN) injection 4 mg (has no administration in time range)  metroNIDAZOLE (FLAGYL) IVPB 500 mg (500 mg Intravenous New Bag/Given 09/20/19 1416)  morphine 2 MG/ML injection 2-4 mg (has no administration in time range)  sodium chloride flush (NS) 0.9 % injection 3 mL (3 mLs Intravenous Given 09/19/19 1943)  iohexol (OMNIPAQUE) 300 MG/ML solution 100 mL (100 mLs Intravenous Contrast Given 09/19/19 2039)  aztreonam  (AZACTAM) 2 g in sodium chloride 0.9 % 100 mL IVPB (0 g Intravenous Stopped 09/19/19 2332)  metroNIDAZOLE (FLAGYL) IVPB 500 mg (0 mg Intravenous Stopped 09/20/19 0110)  gadobutrol (GADAVIST) 1 MMOL/ML injection 7 mL (7 mLs Intravenous Contrast Given 09/20/19 0257)    Mobility walks Low fall risk      R Recommendations: See Admitting Provider Note  Report given to:   Additional Notes:

## 2019-09-20 NOTE — ED Notes (Signed)
Patient transported to CT 

## 2019-09-20 NOTE — Consult Note (Signed)
Reason for Consult:abdominal pain Referring Physician: Dr. Nada Boozer Charles Espinoza is an 78 y.o. male.  HPI: Charles Espinoza complains of general abdominal pain for the past month. He states that the pain comes and goes, but has been present every day for the past month. He also states that he has had a 10 pound weight loss over the past month as well. Charles Espinoza also states he has been extremely fatigued recently as well, but denies night sweats and chills. Charles Espinoza denies bowel habit changes or blood in stool.   Past Medical History:  Diagnosis Date  . COPD (chronic obstructive pulmonary disease) ([place])   . Elevated PSA   . Hx of radiation therapy 09/25/13- 11/27/13   prostate 7800 cGy 39 sessions  . Hyperlipidemia   . Prostate cancer ([place]) 02/27/13   gleason 6, volume 133 cc  . Prostatitis   . Vitamin D deficiency     Past Surgical History:  Procedure Laterality Date  . PROSTATE BIOPSY  02/27/2013  . TONSILLECTOMY      Family History  Problem Relation Age of Onset  . Prostate cancer Other     Social History:  reports that he has been smoking. He has a 57.00 pack-year smoking history. He does not have any smokeless tobacco history on file. He reports current drug use. Drug: Marijuana. He reports that he does not drink alcohol.  Allergies:  Allergies  Allergen Reactions  . Penicillins     Did it involve swelling of the face/tongue/throat, SOB, or low BP? n/a Did it involve sudden or severe rash/hives, skin peeling, or any reaction on the inside of your mouth or nose? n/a Did you need to seek medical attention at a hospital or doctor's office? n/a When did it last happen?year1972 If all above answers are "NO", may proceed with cephalosporin use.    Medications: I have reviewed the patient's current medications.  Results for orders placed or performed during the hospital encounter of 09/19/19 (from the past 48 hour(s))  Lipase, blood     Status: None   Collection Time: 09/19/19  4:58 PM  Result  Value Ref Range   Lipase 21 11 - 51 U/L    Comment: Performed at [facility], [place] [address] [place], [place] [address]  Comprehensive metabolic panel     Status: Abnormal   Collection Time: 09/19/19  4:58 PM  Result Value Ref Range   Sodium 138 135 - 145 mmol/L   Potassium 3.8 3.5 - 5.1 mmol/L   Chloride 100 98 - 111 mmol/L   CO2 23 22 - 32 mmol/L   Glucose, Bld 108 (H) 70 - 99 mg/dL   BUN 19 8 - 23 mg/dL   Creatinine, Ser 1.28 (H) 0.61 - 1.24 mg/dL   Calcium 8.8 (L) 8.9 - 10.3 mg/dL   Total Protein 7.2 6.5 - 8.1 g/dL   Albumin 2.8 (L) 3.5 - 5.0 g/dL   AST 31 15 - 41 U/L   ALT 24 0 - 44 U/L   Alkaline Phosphatase 108 38 - 126 U/L   Total Bilirubin 0.9 0.3 - 1.2 mg/dL   GFR calc non Af Amer 53 (L) >60 mL/min   GFR calc Af Amer >60 >60 mL/min   Anion gap 15 5 - 15    Comment: Performed at [facility] [address] [place], [place] [address]  CBC     Status: Abnormal   Collection Time: 09/19/19  4:58 PM  Result Value Ref  Range   WBC 16.1 (H) 4.0 - 10.5 K/uL   RBC 3.98 (L) 4.22 - 5.81 MIL/uL   Hemoglobin 12.4 (L) 13.0 - 17.0 g/dL   HCT 37.8 (L) 39.0 - 52.0 %   MCV 95.0 80.0 - 100.0 fL   MCH 31.2 26.0 - 34.0 pg   MCHC 32.8 30.0 - 36.0 g/dL   RDW 13.2 11.5 - 15.5 %   Platelets 372 150 - 400 K/uL   nRBC 0.0 0.0 - 0.2 %    Comment: Performed at [facility], [place] [address] [place], [place] [address]  Urinalysis, Routine w reflex microscopic     Status: Abnormal   Collection Time: 09/19/19  7:20 PM  Result Value Ref Range   Color, Urine AMBER (A) YELLOW    Comment: BIOCHEMICALS MAY BE AFFECTED BY COLOR   APPearance HAZY (A) CLEAR   Specific Gravity, Urine 1.021 1.005 - 1.030   pH 5.0 5.0 - 8.0   Glucose, UA NEGATIVE NEGATIVE mg/dL   Hgb urine dipstick MODERATE (A) NEGATIVE   Bilirubin Urine NEGATIVE NEGATIVE   Ketones, ur NEGATIVE NEGATIVE mg/dL   Protein, ur 100 (A) NEGATIVE mg/dL   Nitrite NEGATIVE NEGATIVE   Leukocytes,Ua NEGATIVE NEGATIVE    RBC / HPF 6-10 0 - 5 RBC/hpf   WBC, UA 0-5 0 - 5 WBC/hpf   Bacteria, UA RARE (A) NONE SEEN   Squamous Epithelial / LPF 0-5 0 - 5   Mucus PRESENT    Hyaline Casts, UA PRESENT    Granular Casts, UA PRESENT     Comment: Performed at [facility], [address] [place], [place] [address]  SARS Coronavirus 2 [facility] order, Performed in [facility] hospital lab) Nasopharyngeal Nasopharyngeal Swab     Status: None   Collection Time: 09/19/19  9:43 PM   Specimen: Nasopharyngeal Swab  Result Value Ref Range   SARS Coronavirus 2 NEGATIVE NEGATIVE    Comment: (NOTE) If result is NEGATIVE SARS-CoV-2 target nucleic acids are NOT DETECTED. The SARS-CoV-2 RNA is generally detectable in upper and lower  respiratory specimens during the acute phase of infection. The lowest  concentration of SARS-CoV-2 viral copies this assay can detect is 250  copies / mL. A negative result does not preclude SARS-CoV-2 infection  and should not be used as the sole basis for treatment or other  patient management decisions.  A negative result may occur with  improper specimen collection / handling, submission of specimen other  than nasopharyngeal swab, presence of viral mutation(s) within the  areas targeted by this assay, and inadequate number of viral copies  (<250 copies / mL). A negative result must be combined with clinical  observations, patient history, and epidemiological information. If result is POSITIVE SARS-CoV-2 target nucleic acids are DETECTED. The SARS-CoV-2 RNA is generally detectable in upper and lower  respiratory specimens dur ing the acute phase of infection.  Positive  results are indicative of active infection with SARS-CoV-2.  Clinical  correlation with patient history and other diagnostic information is  necessary to determine patient infection status.  Positive results do  not rule out bacterial infection or co-infection with other viruses. If result is PRESUMPTIVE  POSTIVE SARS-CoV-2 nucleic acids MAY BE PRESENT.   A presumptive positive result was obtained on the submitted specimen  and confirmed on repeat testing.  While 2019 novel coronavirus  (SARS-CoV-2) nucleic acids may be present in the submitted sample  additional confirmatory testing may be necessary for epidemiological  and / or clinical management purposes  to differentiate between  SARS-CoV-2 and other Sarbecovirus currently known to infect humans.  If clinically indicated additional testing with an alternate test  methodology [phone]) is advised. The SARS-CoV-2 RNA is generally  detectable in upper and lower respiratory sp ecimens during the acute  phase of infection. The expected result is Negative. Fact Sheet for Patients:  StrictlyIdeas.no Fact Sheet for Healthcare Providers: BankingDealers.co.za This test is not yet approved or cleared by the [place] FDA and has been authorized for detection and/or diagnosis of SARS-CoV-2 by FDA under an Emergency Use Authorization (EUA).  This EUA will remain in effect (meaning this test can be used) for the duration of the COVID-19 declaration under Section 564(b)(1) of the Act, 21 U.S.C. section 360bbb-3(b)(1), unless the authorization is terminated or revoked sooner. Performed at [facility], [place] [address], [place], [place] [address]   Hepatitis panel, acute     Status: None   Collection Time: 09/20/19 12:05 AM  Result Value Ref Range   Hepatitis B Surface Ag NON REACTIVE NON REACTIVE   HCV Ab NON REACTIVE NON REACTIVE    Comment: (NOTE) Nonreactive HCV antibody screen is consistent with no HCV infections,  unless recent infection is suspected or other evidence exists to indicate HCV infection.    Hep A IgM NON REACTIVE NON REACTIVE   Hep B C IgM NON REACTIVE NON REACTIVE    Comment: Performed at [facility], [place] [address] [place], [place] [address]  PSA      Status: None   Collection Time: 09/20/19 12:05 AM  Result Value Ref Range   Prostatic Specific Antigen 0.53 0.00 - 4.00 ng/mL    Comment: (NOTE) While PSA levels of <=4.0 ng/ml are reported as reference range, some men with levels below 4.0 ng/ml can have prostate cancer and many men with PSA above 4.0 ng/ml do not have prostate cancer.  Other tests such as free PSA, age specific reference ranges, PSA velocity and PSA doubling time may be helpful especially in men less than 66 years old. Performed at [facility], [place] [address] [place], [place] [address]   MRSA PCR Screening     Status: None   Collection Time: 09/20/19  1:25 AM   Specimen: Nasopharyngeal  Result Value Ref Range   MRSA by PCR NEGATIVE NEGATIVE    Comment:        The GeneXpert MRSA Assay (FDA approved for NASAL specimens only), is one component of a comprehensive MRSA colonization surveillance program. It is not intended to diagnose MRSA infection nor to guide or monitor treatment for MRSA infections. Performed at [facility], [place] [address], [place], [place] [address]     Ct Abdomen Pelvis W Contrast  Result Date: 09/19/2019 CLINICAL DATA:  78 year old male with abdominal pain and swelling. EXAM: CT ABDOMEN AND PELVIS WITH CONTRAST TECHNIQUE: Multidetector CT imaging of the abdomen and pelvis was performed using the standard protocol following bolus administration of intravenous contrast. CONTRAST:  164mL OMNIPAQUE IOHEXOL 300 MG/ML  SOLN COMPARISON:  Abdominal radiograph dated 09/19/2019 and CT of the abdomen pelvis dated 05/17/2013 FINDINGS: Lower chest: The visualized lung bases are clear. There is mild emphysema. No intra-abdominal free air. No free fluid. Hepatobiliary: There is a 7.5 x 7.0 cm heterogeneously enhancing mass in the right lobe of the liver (segment VII). This mass is suboptimally characterized but concerning for a malignancy such as hepatocellular carcinoma. Further evaluation  with  MRI without and with contrast a nonemergent basis recommended. No intrahepatic biliary ductal dilatation. The gallbladder is unremarkable. Pancreas: Pancreas is grossly unremarkable as visualized. Spleen: Normal in size without focal abnormality. Adrenals/Urinary Tract: The adrenal glands are unremarkable. There is a 5 mm right renal inferior pole nonobstructing stone. There is no hydronephrosis on either side. Bilateral renal cysts measure up to 2.5 cm in the interpolar aspect of the right kidney. There is a punctate nonobstructing left renal upper pole calculus. There is symmetric enhancement and excretion of contrast by both kidneys. There is diffuse thickened appearance of bladder wall which may be partly related to underdistention or represent cystitis. An infiltrative process is not excluded. Stomach/Bowel: There is segmental thickening of the sigmoid colon. There is a 7.3 x 4.5 cm collection containing debris and air within the pelvis posterior to the urinary bladder and anterior to the rectosigmoid. There is mild diffuse inflammatory changes of the pelvis and perirectal fat. This complex collection is not well characterized but may represent an abscess, and inflamed large diverticula, or a malignancy with a large ulceration. There is distention of the colon with air and stool proximal to the thickened segment of the sigmoid colon. The cecum is located in the mid abdomen. No evidence of twisting or volvulus. There is no definite evidence of small-bowel obstruction. The appendix is normal. Vascular/Lymphatic: There is moderate aortoiliac atherosclerotic disease. The IVC is unremarkable. No portal venous gas. There is no adenopathy. Reproductive: Mildly enlarged prostate gland with multiple fiducial markers. Prostate measures approximately 5.2 cm in transverse axial diameter. The seminal vesicles are symmetric. Other: There is loss of subcutaneous and mesenteric fat and cachexia. Musculoskeletal: No acute or  significant osseous findings. IMPRESSION: 1. Segmental thickening of the sigmoid colon with adjacent inflammatory changes. A 7.3 x 4.5 cm complex collection containing debris and air may represent an abscess or a large malignant ulceration. CT of the pelvis with rectal contrast may provide better evaluation. The thickened appearance of the sigmoid colon may be infectious or inflammatory in etiology or represent infiltrative neoplasm. Further evaluation with sigmoidoscopy following resolution of acute inflammatory changes recommended. 2. Diffuse distention of the colon proximal to the thickened segment of sigmoid. No valvular. No evidence of small-bowel obstruction. Normal appendix. 3. A 7.5 x 7.0 cm heterogeneously enhancing mass in the right lobe of the liver most consistent with malignancy such as hepatocellular carcinoma. Further evaluation with MRI without and with contrast a nonemergent basis recommended. 4. Aortic Atherosclerosis (ICD10-I70.0). 5. Cachexia. Electronically Signed   By: Anner Crete M.D.   On: 09/19/2019 21:15   Dg Abd 2 Views  Result Date: 09/19/2019 CLINICAL DATA:  Distended abdomen EXAM: ABDOMEN - 2 VIEW COMPARISON:  None. FINDINGS: Markedly distended colon extending to the rectum. Mild amount of small bowel gas. No free air. No abnormal calcifications or skeletal lesion. IMPRESSION: Markedly distended colon to the rectum. Findings most compatible with colonic ileus although rectal obstruction possible. Electronically Signed   By: Franchot Gallo M.D.   On: 09/19/2019 16:01    Review of Systems  Constitutional: Positive for malaise/fatigue and weight loss. Negative for chills, diaphoresis and fever.  Respiratory: Negative for cough, hemoptysis, shortness of breath and wheezing.   Cardiovascular: Negative for chest pain, palpitations, orthopnea, claudication and leg swelling.  Gastrointestinal: Positive for abdominal pain. Negative for blood in stool, constipation, diarrhea and  melena.  Genitourinary: Negative for dysuria, flank pain, frequency, hematuria and urgency.   Blood pressure 126/80, pulse 79,  temperature 98.9 F (37.2 C), temperature source Oral, resp. rate 16, height 5\' 8"  (1.727 m), weight 70.3 kg, SpO2 99 %. Physical Exam  Constitutional: No distress.  Eyes: Pupils are equal, round, and reactive to light. Conjunctivae and EOM are normal.  Neck: Normal range of motion. Neck supple.  Cardiovascular: Normal rate, regular rhythm and normal heart sounds.  Respiratory: Effort normal and breath sounds normal. No respiratory distress. He has no wheezes. He has no rales.  GI: He exhibits distension. There is no abdominal tenderness. There is no rebound and no guarding.  Neurological: He is alert.  Skin: He is not diaphoretic.    Assessment/Plan: Abdominal pain w/ Mass in sigmoid colon and possible liver mass Obtain MRI to evaluate liver mass.  -ID: TBD -FEN: IVF, NPO -DVT: none - Follow up: TBD  Renato Gails Shamyia Grandpre 09/20/2019, 3:47 AM

## 2019-09-20 NOTE — Consult Note (Addendum)
Consultation  Referring Provider: TR[facility]/ Ezenduke MD Primary Care Physician:  Rogers Blocker, MD Primary Gastroenterologist:  none  Reason for Consultation: Probable obstructing colon cancer and large liver mass  HPI: Charles Espinoza is a 78 y.o. male, admitted through the emergency room this morning, after presenting with 1 month history of rather generalized abdominal pain, then progressive distention over the past week or so.  He has had an associated 10 pound weight loss and complaints of fatigue.  Appetite has been decreased, denies nausea or vomiting. No documented fever or chills, no melena or hematochezia.  He says his bowels have been moving fairly normally, and had a bowel movement this morning. Patient has history of prostate cancer status post radiation in 2015, and COPD. He has not had any prior GI evaluation/colonoscopy.  Evaluation in the emergency room by CT shows a large complex sigmoid colon collection measuring 7.5 x 5 cm attaining debris and air, rule out infectious versus infiltrative process.  There is distended colon proximal to this area.  Also with a 7.5 x 7 cm mass in the right lobe of the liver.  Subsequent MRI was done this morning showing 7.9 x 7.5 x 7.4 cm mass in the posterior right lobe of liver, no portal hypertension, positive cholelithiasis.  Process favored to represent metastatic lesion from obstructed colon cancer with contained perforation.  Labs WBC 16.1, hemoglobin 12.4 hematocrit 37.9. LFTs within normal limits Creatinine 1.28 Acute otitis panel negative   Past Medical History:  Diagnosis Date  . COPD (chronic obstructive pulmonary disease) ([place])   . Elevated PSA   . Hx of radiation therapy 09/25/13- 11/27/13   prostate 7800 cGy 39 sessions  . Hyperlipidemia   . Prostate cancer ([place]) 02/27/13   gleason 6, volume 133 cc  . Prostatitis   . Vitamin D deficiency     Past Surgical History:  Procedure Laterality Date  . PROSTATE BIOPSY  02/27/2013   . TONSILLECTOMY      Prior to Admission medications   Medication Sig Start Date End Date Taking? Authorizing Provider  Difluprednate (DUREZOL) 0.05 % EMUL Place 1 drop into the left eye daily.   Yes [provider]    Current Facility-Administered Medications  Medication Dose Route Frequency Provider Last Rate Last Dose  . 0.9 %  sodium chloride infusion   Intravenous Continuous Etta Quill, DO 75 mL/hr at 09/20/19 1132    . acetaminophen (TYLENOL) tablet 650 mg  650 mg Oral Q6H PRN Etta Quill, DO       Or  . acetaminophen (TYLENOL) suppository 650 mg  650 mg Rectal Q6H PRN Etta Quill, DO      . aztreonam (AZACTAM) 1 g in sodium chloride 0.9 % 100 mL IVPB  1 g Intravenous Q8H Tyrone Apple, [facility]   Stopped at 09/20/19 0932  . metroNIDAZOLE (FLAGYL) IVPB 500 mg  500 mg Intravenous Q8H Etta Quill, DO   Stopped at 09/20/19 0175  . morphine 2 MG/ML injection 2-4 mg  2-4 mg Intravenous Q4H PRN Etta Quill, DO      . ondansetron [facility]) tablet 4 mg  4 mg Oral Q6H PRN Etta Quill, DO       Or  . ondansetron [facility]) injection 4 mg  4 mg Intravenous Q6H PRN Etta Quill, DO       Current Outpatient Medications  Medication Sig Dispense Refill  . Difluprednate (DUREZOL) 0.05 % EMUL Place 1 drop into  the left eye daily.      Allergies as of 09/19/2019 - Review Complete 09/19/2019  Allergen Reaction Noted  . Penicillins  02/26/2013    Family History  Problem Relation Age of Onset  . Prostate cancer Other     Social History   Socioeconomic History  . Marital status: Married    Spouse name: Not on file  . Number of children: 3  . Years of education: Not on file  . Highest education level: Not on file  Occupational History  . Not on file  Social Needs  . Financial resource strain: Not on file  . Food insecurity    Worry: Not on file    Inability: Not on file  . Transportation needs    Medical: Not on file    Non-medical: Not on file   Tobacco Use  . Smoking status: Current Every Day Smoker    Packs/day: 1.00    Years: 57.00    Pack years: 57.00    Last attempt to quit: 11/22/2012    Years since quitting: 6.8  Substance and Sexual Activity  . Alcohol use: Never    Frequency: Never  . Drug use: Yes    Types: Marijuana  . Sexual activity: Not on file  Lifestyle  . Physical activity    Days per week: Not on file    Minutes per session: Not on file  . Stress: Not on file  Relationships  . Social Herbalist on phone: Not on file    Gets together: Not on file    Attends religious service: Not on file    Active member of club or organization: Not on file    Attends meetings of clubs or organizations: Not on file    Relationship status: Not on file  . Intimate partner violence    Fear of current or ex partner: Not on file    Emotionally abused: Not on file    Physically abused: Not on file    Forced sexual activity: Not on file  Other Topics Concern  . Not on file  Social History Narrative  . Not on file    Review of Systems: Pertinent positive and negative review of systems were noted in the above HPI section.  All other review of systems was otherwise negative.  Physical Exam: Vital signs in last 24 hours: Temp:  [98.4 F (36.9 C)-98.9 F (37.2 C)] 98.9 F (37.2 C) (10/01 1654) Pulse Rate:  [50-104] 74 (10/02 1100) Resp:  [16-24] 16 (10/01 2251) BP: (103-135)/(58-97) 126/58 (10/02 1100) SpO2:  [97 %-100 %] 98 % (10/02 1100) Weight:  [70.3 kg] 70.3 kg (10/01 2200)   General:   Alert,  Well-developed, well-nourished, elderly African-American male pleasant and cooperative in NAD Head:  Normocephalic and atraumatic. Eyes:  Sclera clear, no icterus.   Conjunctiva pink. Ears:  Normal auditory acuity. Nose:  No deformity, discharge,  or lesions. Mouth:  No deformity or lesions.   Neck:  Supple; no masses or thyromegaly. Lungs:  Clear throughout to auscultation.   No wheezes, crackles, or  rhonchi.  Heart:  Regular rate and rhythm; no murmurs, clicks, rubs,  or gallops. Abdomen: Distended and somewhat tympanitic bowel sounds hyperactive with some rushes.  No definite palpable mass or hepatosplenomegaly no focal tenderness Rectal:  Deferred  Msk:  Symmetrical without gross deformities. . Pulses:  Normal pulses noted. Extremities:  Without clubbing or edema. Neurologic:  Alert and  oriented x4;  grossly normal  neurologically. Skin:  Intact without significant lesions or rashes.. Psych:  Alert and cooperative. Normal mood and affect.  Intake/Output from previous day: No intake/output data recorded. Intake/Output this shift: Total I/O In: 200 [IV Piggyback:200] Out: -   Lab Results: Recent Labs    09/19/19 1658 09/20/19 0430  WBC 16.1* 14.9*  HGB 12.4* 11.9*  HCT 37.8* 35.9*  PLT 372 350   BMET Recent Labs    09/19/19 1658 09/20/19 0430  NA 138 138  K 3.8 3.6  CL 100 100  CO2 23 26  GLUCOSE 108* 106*  BUN 19 19  CREATININE 1.28* 1.28*  CALCIUM 8.8* 8.6*   LFT Recent Labs    09/19/19 1658  PROT 7.2  ALBUMIN 2.8*  AST 31  ALT 24  ALKPHOS 108  BILITOT 0.9   PT/INR No results for input(s): LABPROT, INR in the last 72 hours. Hepatitis Panel Recent Labs    09/20/19 0005  HEPBSAG NON REACTIVE  HCVAB NON REACTIVE  HEPAIGM NON REACTIVE  HEPBIGM NON REACTIVE     IMPRESSION:  #32 78 year old African-American male with 1 month history of generalized abdominal discomfort now with progressive abdominal distention over the past week.  He has had associated decrease in appetite, 10 pound weight loss and complains of generalized fatigue. No vomiting and continues to have bowel movements  Work-up has revealed a partially obstructing versus obstructing sigmoid colon complex mass/collection attaining debris and air. 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  #2 large right hepatic mass-probable metastatic lesion,  primary hepatic neoplasm less likely  #3 history of prostate cancer status post radiation 2015 #4 COPD  PLAN: Patient has been started on IV antibiotics Surgery is following Allow sips of clears CEA and AFP pending, CT of chest pending Expect patient will require surgical resection and temporary colostomy. He is not a candidate currently for colonoscopy for biopsy with what appears to be a contained perforation at the level of the sigmoid.   Amy Esterwood PA-C 09/20/2019, 12:10 PM     [facility] Attending   I have taken an interval history, reviewed the chart and examined the patient. I agree with the Advanced Practitioner's note, impression and recommendations.   Diverticulitis and liver mass (? HCC) Colon cancer w/ local perf and liver met vs HCC  Await AFP and clinical course  Gatha Mayer, MD, [facility] 09/20/2019 7:13 PM Pager [phone]

## 2019-09-20 NOTE — Progress Notes (Signed)
PROGRESS NOTE  Charles Espinoza [id number] DOB: 12/10/1941 DOA: 09/19/2019 PCP: Rogers Blocker, MD  HPI/Recap of past 24 hours: HPI from Dr Dutch Gray Koperski is a 78 y.o. male with medical history significant of prostate CA s/p radiation in Jan 2015, COPD presents to the ED with c/o generalized abd pain for the past 1 month, worsening over the past 1 week with abd distention. Patient also c/o loose watery stool and still passing gas. In ED, CT shows no volvulus, but does show a large 7.5x4.5 complex sigmoid collection possibly abscess vs malignant ulceration as well as 7.5x7.0 cm hepatic lesion. Pt with WBC 16k. Pt started on aztreonam and flagyl.  Gen surg consulted and asked medicine to admit.   Today, pt reports feeling ok, stated abdominal pain is intermittent, pain meds help. Denies any N/V, fever/chills, chest pain, SOB.    Assessment/Plan: Principal Problem:   Colonic mass Active Problems:   Malignant neoplasm of prostate (HCC)   Liver mass   Mass of colon  Partial large bowel obstruction likely 2/2 sigmoid colon mass Afebrile, with leukocytosis CT abd/pelvis shows large mass with possible abscess (?contained perforation versus malignant ulceration) CEA tumor marker pending CT chest pending General surgery on board, may need surgery GI consulted Continue IV aztreonam, Flagyl Pain management IV fluids, n.p.o. for now  Liver lesion Hepatitis panel pending AFP tumor marker pending MRI showed large right hepatic mass lesion, findings favors metastatic lesion from obstructing colon cancer leading to contained perforation adjacent to the sigmoid colon. Primary hepatic neoplasm is felt less likely GI consulted  AKI Last creatinine was 1.2 in 2014 Likely due to above Continue IV fluids Daily BMP  Anemia of chronic disease Likely due to above Daily CBC  History of prostate CA Status post completion of radiation in 2015 PSA 0.53        Malnutrition Type:        Malnutrition Characteristics:      Nutrition Interventions:       Estimated body mass index is 23.57 kg/m as calculated from the following:   Height as of this encounter: 5\' 8"  (1.727 m).   Weight as of this encounter: 70.3 kg.     Code Status: Full  Family Communication: None at bedside  Disposition Plan: To be determined   Consultants:  General surgery  GI  Procedures:  None  Antimicrobials:  Aztreonam  Flagyl  DVT prophylaxis: SCDs for now due to possible procedures   Objective: Vitals:   09/20/19 0000 09/20/19 0100 09/20/19 0400 09/20/19 0401  BP: 122/82 126/80 120/77   Pulse: 79 79  69  Resp:      Temp:      TempSrc:      SpO2: 100% 99%  97%  Weight:      Height:        Intake/Output Summary (Last 24 hours) at 09/20/2019 1100 Last data filed at 09/20/2019 0932 Gross per 24 hour  Intake 200 ml  Output --  Net 200 ml   Filed Weights   09/19/19 2200  Weight: 70.3 kg    Exam:  General: NAD   Cardiovascular: S1, S2 present  Respiratory: CTAB  Abdomen: Soft, + generalized tenderness, +distended, bowel sounds present  Musculoskeletal: No bilateral pedal edema noted  Skin: Normal  Psychiatry: Normal mood   Data Reviewed: CBC: Recent Labs  Lab 09/19/19 1658 09/20/19 0430  WBC 16.1* 14.9*  HGB 12.4* 11.9*  HCT 37.8* 35.9*  MCV 95.0 93.0  PLT  372 [id number]   Basic Metabolic Panel: Recent Labs  Lab 09/19/19 1658 09/20/19 0430  NA 138 138  K 3.8 3.6  CL 100 100  CO2 23 26  GLUCOSE 108* 106*  BUN 19 19  CREATININE 1.28* 1.28*  CALCIUM 8.8* 8.6*   GFR: Estimated Creatinine Clearance: 44.5 mL/min (A) (by C-G formula based on SCr of 1.28 mg/dL (H)). Liver Function Tests: Recent Labs  Lab 09/19/19 1658  AST 31  ALT 24  ALKPHOS 108  BILITOT 0.9  PROT 7.2  ALBUMIN 2.8*   Recent Labs  Lab 09/19/19 1658  LIPASE 21   No results for input(s): AMMONIA in the last 168 hours. Coagulation Profile: No results for  input(s): INR, PROTIME in the last 168 hours. Cardiac Enzymes: No results for input(s): CKTOTAL, CKMB, CKMBINDEX, TROPONINI in the last 168 hours. BNP (last 3 results) No results for input(s): PROBNP in the last 8760 hours. HbA1C: No results for input(s): HGBA1C in the last 72 hours. CBG: No results for input(s): GLUCAP in the last 168 hours. Lipid Profile: No results for input(s): CHOL, HDL, LDLCALC, TRIG, CHOLHDL, LDLDIRECT in the last 72 hours. Thyroid Function Tests: No results for input(s): TSH, T4TOTAL, FREET4, T3FREE, THYROIDAB in the last 72 hours. Anemia Panel: No results for input(s): VITAMINB12, FOLATE, FERRITIN, TIBC, IRON, RETICCTPCT in the last 72 hours. Urine analysis:    Component Value Date/Time   COLORURINE AMBER (A) 09/19/2019 1920   APPEARANCEUR HAZY (A) 09/19/2019 1920   LABSPEC 1.021 09/19/2019 1920   PHURINE 5.0 09/19/2019 1920   GLUCOSEU NEGATIVE 09/19/2019 1920   HGBUR MODERATE (A) 09/19/2019 1920   BILIRUBINUR NEGATIVE 09/19/2019 1920   KETONESUR NEGATIVE 09/19/2019 1920   PROTEINUR 100 (A) 09/19/2019 1920   NITRITE NEGATIVE 09/19/2019 1920   LEUKOCYTESUR NEGATIVE 09/19/2019 1920   Sepsis Labs: @LABRCNTIP (procalcitonin:4,lacticidven:4)  ) Recent Results (from the past 240 hour(s))  SARS Coronavirus 2 [facility] order, Performed in [facility] hospital lab) Nasopharyngeal Nasopharyngeal Swab     Status: None   Collection Time: 09/19/19  9:43 PM   Specimen: Nasopharyngeal Swab  Result Value Ref Range Status   SARS Coronavirus 2 NEGATIVE NEGATIVE Final    Comment: (NOTE) If result is NEGATIVE SARS-CoV-2 target nucleic acids are NOT DETECTED. The SARS-CoV-2 RNA is generally detectable in upper and lower  respiratory specimens during the acute phase of infection. The lowest  concentration of SARS-CoV-2 viral copies this assay can detect is 250  copies / mL. A negative result does not preclude SARS-CoV-2 infection  and should not be used as the sole  basis for treatment or other  patient management decisions.  A negative result may occur with  improper specimen collection / handling, submission of specimen other  than nasopharyngeal swab, presence of viral mutation(s) within the  areas targeted by this assay, and inadequate number of viral copies  (<250 copies / mL). A negative result must be combined with clinical  observations, patient history, and epidemiological information. If result is POSITIVE SARS-CoV-2 target nucleic acids are DETECTED. The SARS-CoV-2 RNA is generally detectable in upper and lower  respiratory specimens dur ing the acute phase of infection.  Positive  results are indicative of active infection with SARS-CoV-2.  Clinical  correlation with patient history and other diagnostic information is  necessary to determine patient infection status.  Positive results do  not rule out bacterial infection or co-infection with other viruses. If result is PRESUMPTIVE POSTIVE SARS-CoV-2 nucleic acids MAY BE PRESENT.   A  presumptive positive result was obtained on the submitted specimen  and confirmed on repeat testing.  While 2019 novel coronavirus  (SARS-CoV-2) nucleic acids may be present in the submitted sample  additional confirmatory testing may be necessary for epidemiological  and / or clinical management purposes  to differentiate between  SARS-CoV-2 and other Sarbecovirus currently known to infect humans.  If clinically indicated additional testing with an alternate test  methodology [phone]) is advised. The SARS-CoV-2 RNA is generally  detectable in upper and lower respiratory sp ecimens during the acute  phase of infection. The expected result is Negative. Fact Sheet for Patients:  StrictlyIdeas.no Fact Sheet for Healthcare Providers: BankingDealers.co.za This test is not yet approved or cleared by the [place] FDA and has been authorized for detection  and/or diagnosis of SARS-CoV-2 by FDA under an Emergency Use Authorization (EUA).  This EUA will remain in effect (meaning this test can be used) for the duration of the COVID-19 declaration under Section 564(b)(1) of the Act, 21 U.S.C. section 360bbb-3(b)(1), unless the authorization is terminated or revoked sooner. Performed at [facility], [place] [address] [place], [place] [address]   MRSA PCR Screening     Status: None   Collection Time: 09/20/19  1:25 AM   Specimen: Nasopharyngeal  Result Value Ref Range Status   MRSA by PCR NEGATIVE NEGATIVE Final    Comment:        The GeneXpert MRSA Assay (FDA approved for NASAL specimens only), is one component of a comprehensive MRSA colonization surveillance program. It is not intended to diagnose MRSA infection nor to guide or monitor treatment for MRSA infections. Performed at [facility], [place] [address], [place],  [address]       Studies: Ct Abdomen Pelvis W Contrast  Result Date: 09/19/2019 CLINICAL DATA:  78 year old male with abdominal pain and swelling. EXAM: CT ABDOMEN AND PELVIS WITH CONTRAST TECHNIQUE: Multidetector CT imaging of the abdomen and pelvis was performed using the standard protocol following bolus administration of intravenous contrast. CONTRAST:  159mL OMNIPAQUE IOHEXOL 300 MG/ML  SOLN COMPARISON:  Abdominal radiograph dated 09/19/2019 and CT of the abdomen pelvis dated 05/17/2013 FINDINGS: Lower chest: The visualized lung bases are clear. There is mild emphysema. No intra-abdominal free air. No free fluid. Hepatobiliary: There is a 7.5 x 7.0 cm heterogeneously enhancing mass in the right lobe of the liver (segment VII). This mass is suboptimally characterized but concerning for a malignancy such as hepatocellular carcinoma. Further evaluation with MRI without and with contrast a nonemergent basis recommended. No intrahepatic biliary ductal dilatation. The gallbladder is unremarkable. Pancreas:  Pancreas is grossly unremarkable as visualized. Spleen: Normal in size without focal abnormality. Adrenals/Urinary Tract: The adrenal glands are unremarkable. There is a 5 mm right renal inferior pole nonobstructing stone. There is no hydronephrosis on either side. Bilateral renal cysts measure up to 2.5 cm in the interpolar aspect of the right kidney. There is a punctate nonobstructing left renal upper pole calculus. There is symmetric enhancement and excretion of contrast by both kidneys. There is diffuse thickened appearance of bladder wall which may be partly related to underdistention or represent cystitis. An infiltrative process is not excluded. Stomach/Bowel: There is segmental thickening of the sigmoid colon. There is a 7.3 x 4.5 cm collection containing debris and air within the pelvis posterior to the urinary bladder and anterior to the rectosigmoid. There is mild diffuse inflammatory changes of the pelvis and perirectal fat. This complex collection is not well  characterized but may represent an abscess, and inflamed large diverticula, or a malignancy with a large ulceration. There is distention of the colon with air and stool proximal to the thickened segment of the sigmoid colon. The cecum is located in the mid abdomen. No evidence of twisting or volvulus. There is no definite evidence of small-bowel obstruction. The appendix is normal. Vascular/Lymphatic: There is moderate aortoiliac atherosclerotic disease. The IVC is unremarkable. No portal venous gas. There is no adenopathy. Reproductive: Mildly enlarged prostate gland with multiple fiducial markers. Prostate measures approximately 5.2 cm in transverse axial diameter. The seminal vesicles are symmetric. Other: There is loss of subcutaneous and mesenteric fat and cachexia. Musculoskeletal: No acute or significant osseous findings. IMPRESSION: 1. Segmental thickening of the sigmoid colon with adjacent inflammatory changes. A 7.3 x 4.5 cm complex  collection containing debris and air may represent an abscess or a large malignant ulceration. CT of the pelvis with rectal contrast may provide better evaluation. The thickened appearance of the sigmoid colon may be infectious or inflammatory in etiology or represent infiltrative neoplasm. Further evaluation with sigmoidoscopy following resolution of acute inflammatory changes recommended. 2. Diffuse distention of the colon proximal to the thickened segment of sigmoid. No valvular. No evidence of small-bowel obstruction. Normal appendix. 3. A 7.5 x 7.0 cm heterogeneously enhancing mass in the right lobe of the liver most consistent with malignancy such as hepatocellular carcinoma. Further evaluation with MRI without and with contrast a nonemergent basis recommended. 4. Aortic Atherosclerosis (ICD10-I70.0). 5. Cachexia. Electronically Signed   By: Anner Crete M.D.   On: 09/19/2019 21:15   Mr Liver W Wo Contrast  Addendum Date: 09/20/2019   ADDENDUM REPORT: 09/20/2019 08:31 ADDENDUM: These results were called by telephone at the time of interpretation on 09/20/2019 at 8:30 am to provider Dr. Horris Latino , who verbally acknowledged these results. Low T2 signal likely relates to fibrotic changes within this lesion. Electronically Signed   By: Zetta Bills M.D.   On: 09/20/2019 08:31   Result Date: 09/20/2019 CLINICAL DATA:  History hepatic malignancy presenting to the emergency department generalized abdominal pain, worsening past month. EXAM: MRI ABDOMEN WITHOUT AND WITH CONTRAST TECHNIQUE: Multiplanar multisequence MR imaging of the abdomen was performed both before and after the administration of intravenous contrast. CONTRAST:  66mL GADAVIST GADOBUTROL 1 MMOL/ML IV SOLN COMPARISON:  CT evaluation 09/20/2019. FINDINGS: Lower chest: No signs of pleural effusion or definite pulmonary abnormality. Hepatobiliary: 7.9 x 7.5 7.4 cm mass in the posterior right hepatic lobe is poorly enhancing, mainly in the  periphery. Lesion displays mixed T2 signal. No signs chronic liver disease portal hypertension. Cholelithiasis. No biliary ductal dilation. Pancreas: No mass, inflammatory changes, or other parenchymal abnormality identified. Spleen:  Within normal limits in size and appearance. Adrenals/Urinary Tract: No masses identified. No evidence of hydronephrosis. Bilateral renal cysts. Stomach/Bowel: Bowel distention is similar to the previous exam. The sigmoid colon not assessed on the current examination. Vascular/Lymphatic: Patent abdominal vasculature. Retroaortic left renal vein. No adenopathy. Other:  None. Musculoskeletal: No signs acute musculoskeletal finding. IMPRESSION: Large right hepatic mass lesion which is poorly enhancing. Constellation of findings favors metastatic lesion from obstructing colon cancer leading to contained perforation adjacent to the sigmoid colon. Primary hepatic neoplasm is felt less likely. No signs of vascular invasion or adenopathy. The presence of susceptibility in the setting of bowel distension limits assessment. Electronically Signed: By: Zetta Bills M.D. On: 09/20/2019 07:59   Dg Abd 2 Views  Result Date: 09/19/2019 CLINICAL DATA:  Distended abdomen EXAM: ABDOMEN - 2 VIEW COMPARISON:  None. FINDINGS: Markedly distended colon extending to the rectum. Mild amount of small bowel gas. No free air. No abnormal calcifications or skeletal lesion. IMPRESSION: Markedly distended colon to the rectum. Findings most compatible with colonic ileus although rectal obstruction possible. Electronically Signed   By: Franchot Gallo M.D.   On: 09/19/2019 16:01    Scheduled Meds:  Continuous Infusions:  sodium chloride 75 mL/hr at 09/20/19 0014   aztreonam Stopped (09/20/19 0932)   metronidazole Stopped (09/20/19 0722)     LOS: 1 day     Alma Friendly, MD [facility]  If 7PM-7AM, please contact night-coverage [email or web address] 09/20/2019, 11:00 AM

## 2019-09-20 NOTE — Progress Notes (Signed)
Called by radiology on MRI: official read will be done by body radiology specialist in AM but: 1) colon mass looks like malignant ulceration 2) Liver lesion also looks quite worrisome for malignancy on MRI.  Of note, PSA has come back nl, this WAS elevated in past during his prostate cancer (according to his PMH), so presumably what ever malignancy(s) we are dealing with here is not prostate origin.

## 2019-09-20 NOTE — ED Notes (Signed)
[id number] Charles Espinoza- wife would like update on pt.

## 2019-09-20 NOTE — ED Notes (Signed)
Pt returned from CT °

## 2019-09-21 ENCOUNTER — Inpatient Hospital Stay (HOSPITAL_COMMUNITY): Payer: Medicare Other

## 2019-09-21 DIAGNOSIS — R140 Abdominal distension (gaseous): Secondary | ICD-10-CM

## 2019-09-21 LAB — CBC WITH DIFFERENTIAL/PLATELET
Abs Immature Granulocytes: 0.18 10*3/uL — ABNORMAL HIGH (ref 0.00–0.07)
Basophils Absolute: 0 10*3/uL (ref 0.0–0.1)
Basophils Relative: 0 %
Eosinophils Absolute: 0 10*3/uL (ref 0.0–0.5)
Eosinophils Relative: 0 %
HCT: 34.8 % — ABNORMAL LOW (ref 39.0–52.0)
Hemoglobin: 11.5 g/dL — ABNORMAL LOW (ref 13.0–17.0)
Immature Granulocytes: 1 %
Lymphocytes Relative: 4 %
Lymphs Abs: 0.7 10*3/uL (ref 0.7–4.0)
MCH: 30.8 pg (ref 26.0–34.0)
MCHC: 33 g/dL (ref 30.0–36.0)
MCV: 93.3 fL (ref 80.0–100.0)
Monocytes Absolute: 1.2 10*3/uL — ABNORMAL HIGH (ref 0.1–1.0)
Monocytes Relative: 8 %
Neutro Abs: 13 10*3/uL — ABNORMAL HIGH (ref 1.7–7.7)
Neutrophils Relative %: 87 %
Platelets: 354 10*3/uL (ref 150–400)
RBC: 3.73 MIL/uL — ABNORMAL LOW (ref 4.22–5.81)
RDW: 13.2 % (ref 11.5–15.5)
WBC: 15 10*3/uL — ABNORMAL HIGH (ref 4.0–10.5)
nRBC: 0 % (ref 0.0–0.2)

## 2019-09-21 LAB — CEA
CEA: 10.1 ng/mL — ABNORMAL HIGH (ref 0.0–4.7)
CEA: 10.6 ng/mL — ABNORMAL HIGH (ref 0.0–4.7)

## 2019-09-21 LAB — BASIC METABOLIC PANEL
Anion gap: 12 (ref 5–15)
BUN: 20 mg/dL (ref 8–23)
CO2: 22 mmol/L (ref 22–32)
Calcium: 8.2 mg/dL — ABNORMAL LOW (ref 8.9–10.3)
Chloride: 103 mmol/L (ref 98–111)
Creatinine, Ser: 1.11 mg/dL (ref 0.61–1.24)
GFR calc Af Amer: >60 mL/min (ref >60)
GFR calc non Af Amer: >60 mL/min (ref >60)
Glucose, Bld: 100 mg/dL — ABNORMAL HIGH (ref 70–99)
Potassium: 3.2 mmol/L — ABNORMAL LOW (ref 3.5–5.1)
Sodium: 137 mmol/L (ref 135–145)

## 2019-09-21 LAB — AFP TUMOR MARKER
AFP, Serum, Tumor Marker: 2.8 ng/mL (ref 0.0–8.3)
AFP, Serum, Tumor Marker: 2.8 ng/mL (ref 0.0–8.3)

## 2019-09-21 MED ORDER — POTASSIUM CHLORIDE CRYS ER 20 MEQ PO TBCR
40.0000 meq | EXTENDED_RELEASE_TABLET | Freq: Once | ORAL | Status: AC
  Administered 2019-09-21: 09:00:00 40 meq via ORAL
  Filled 2019-09-21: qty 2

## 2019-09-21 MED ORDER — ENOXAPARIN SODIUM 40 MG/0.4ML ~~LOC~~ SOLN
40.0000 mg | SUBCUTANEOUS | Status: DC
  Administered 2019-09-21 – 2019-09-29 (×8): 40 mg via SUBCUTANEOUS
  Filled 2019-09-21 (×8): qty 0.4

## 2019-09-21 NOTE — Progress Notes (Signed)
PROGRESS NOTE  Charles Espinoza [id number] DOB: 17-Jun-1941 DOA: 09/19/2019 PCP: Charles Blocker, MD  HPI/Recap of past 24 hours: HPI from Dr Charles Espinoza is a 78 y.o. male with medical history significant of prostate CA s/p radiation in Jan 2015, COPD presents to the ED with c/o generalized abd pain for the past 1 month, worsening over the past 1 week with abd distention. Patient also c/o loose watery stool and still passing gas. In ED, CT shows no volvulus, but does show a large 7.5x4.5 complex sigmoid collection possibly abscess vs malignant ulceration as well as 7.5x7.0 cm hepatic lesion. Pt with WBC 16k. Pt started on aztreonam and flagyl.  Gen surg consulted and asked medicine to admit.   Today, patient still reports abdominal pain, with distention.  Denies any nausea/vomiting, bowel movements, fever/chills, chest pain.    Assessment/Plan: Principal Problem:   Colonic mass Active Problems:   Malignant neoplasm of prostate (HCC)   Liver mass   Mass of colon   Abdominal distension  Large bowel obstruction likely 2/2 sigmoid colon mass vs infectious process with contained perforation Afebrile, with leukocytosis CT abd/pelvis shows large mass with possible abscess (?contained perforation versus malignant ulceration) CEA 10.6 CT chest shows mass extends from the mediastinum into the left upper lobe consistent with neoplastic disease General surgery on board, plan for ex lap next week, recommend NG tube for decompression GI consulted, no further recommendation for now, will follow Continue IV aztreonam, Flagyl Pain management IV fluids, n.p.o. for now, NG tube  Liver lesion, likely mets Hepatitis panel unremarkable AFP tumor marker 2.8 MRI showed large right hepatic mass lesion, findings favors metastatic lesion from obstructing colon cancer leading to contained perforation adjacent to the sigmoid colon. Primary hepatic neoplasm is felt less likely GI consulted   Hypokalemia Replace PRN  AKI Improving Last creatinine was 1.2 in 2014 Likely due to above Continue IV fluids Daily BMP  Anemia of chronic disease Likely due to above Daily CBC  History of prostate CA Status post completion of radiation in 2015 PSA 0.53        Malnutrition Type:      Malnutrition Characteristics:      Nutrition Interventions:       Estimated body mass index is 23.57 kg/m as calculated from the following:   Height as of this encounter: 5\' 8"  (1.727 m).   Weight as of this encounter: 70.3 kg.     Code Status: Full  Family Communication: None at bedside  Disposition Plan: To be determined   Consultants:  General surgery  GI  Procedures:  None  Antimicrobials:  Aztreonam  Flagyl  DVT prophylaxis: Lovenox   Objective: Vitals:   09/20/19 1445 09/20/19 1606 09/20/19 2049 09/21/19 0506  BP: 121/83 116/77 115/74 127/80  Pulse: 68 71 79 79  Resp: (!) 22 19 17 17   Temp:  97.8 F (36.6 C) 98 F (36.7 C) (!) 97.4 F (36.3 C)  TempSrc:  Oral Oral Oral  SpO2: 98% 100% 100% 99%  Weight:      Height:        Intake/Output Summary (Last 24 hours) at 09/21/2019 1102 Last data filed at 09/21/2019 0900 Gross per 24 hour  Intake 1924.59 ml  Output 75 ml  Net 1849.59 ml   Filed Weights   09/19/19 2200  Weight: 70.3 kg    Exam:  General: NAD   Cardiovascular: S1, S2 present  Respiratory: CTAB  Abdomen: Soft, tense, + generalized tenderness, ++distended,  bowel sounds present  Musculoskeletal: No bilateral pedal edema noted  Skin: Normal  Psychiatry: Normal mood   Data Reviewed: CBC: Recent Labs  Lab 09/19/19 1658 09/20/19 0430 09/21/19 0317  WBC 16.1* 14.9* 15.0*  NEUTROABS  --   --  13.0*  HGB 12.4* 11.9* 11.5*  HCT 37.8* 35.9* 34.8*  MCV 95.0 93.0 93.3  PLT 372 350 [id number]   Basic Metabolic Panel: Recent Labs  Lab 09/19/19 1658 09/20/19 0430 09/21/19 0317  NA 138 138 137  K 3.8 3.6 3.2*  CL  100 100 103  CO2 23 26 22   GLUCOSE 108* 106* 100*  BUN 19 19 20   CREATININE 1.28* 1.28* 1.11  CALCIUM 8.8* 8.6* 8.2*   GFR: Estimated Creatinine Clearance: 51.4 mL/min (by C-G formula based on SCr of 1.11 mg/dL). Liver Function Tests: Recent Labs  Lab 09/19/19 1658  AST 31  ALT 24  ALKPHOS 108  BILITOT 0.9  PROT 7.2  ALBUMIN 2.8*   Recent Labs  Lab 09/19/19 1658  LIPASE 21   No results for input(s): AMMONIA in the last 168 hours. Coagulation Profile: No results for input(s): INR, PROTIME in the last 168 hours. Cardiac Enzymes: No results for input(s): CKTOTAL, CKMB, CKMBINDEX, TROPONINI in the last 168 hours. BNP (last 3 results) No results for input(s): PROBNP in the last 8760 hours. HbA1C: No results for input(s): HGBA1C in the last 72 hours. CBG: No results for input(s): GLUCAP in the last 168 hours. Lipid Profile: No results for input(s): CHOL, HDL, LDLCALC, TRIG, CHOLHDL, LDLDIRECT in the last 72 hours. Thyroid Function Tests: No results for input(s): TSH, T4TOTAL, FREET4, T3FREE, THYROIDAB in the last 72 hours. Anemia Panel: No results for input(s): VITAMINB12, FOLATE, FERRITIN, TIBC, IRON, RETICCTPCT in the last 72 hours. Urine analysis:    Component Value Date/Time   COLORURINE AMBER (A) 09/19/2019 1920   APPEARANCEUR HAZY (A) 09/19/2019 1920   LABSPEC 1.021 09/19/2019 1920   PHURINE 5.0 09/19/2019 1920   GLUCOSEU NEGATIVE 09/19/2019 1920   HGBUR MODERATE (A) 09/19/2019 1920   BILIRUBINUR NEGATIVE 09/19/2019 1920   KETONESUR NEGATIVE 09/19/2019 1920   PROTEINUR 100 (A) 09/19/2019 1920   NITRITE NEGATIVE 09/19/2019 1920   LEUKOCYTESUR NEGATIVE 09/19/2019 1920   Sepsis Labs: @LABRCNTIP (procalcitonin:4,lacticidven:4)  ) Recent Results (from the past 240 hour(s))  SARS Coronavirus 2 [facility] order, Performed in [facility] hospital lab) Nasopharyngeal Nasopharyngeal Swab     Status: None   Collection Time: 09/19/19  9:43 PM   Specimen:  Nasopharyngeal Swab  Result Value Ref Range Status   SARS Coronavirus 2 NEGATIVE NEGATIVE Final    Comment: (NOTE) If result is NEGATIVE SARS-CoV-2 target nucleic acids are NOT DETECTED. The SARS-CoV-2 RNA is generally detectable in upper and lower  respiratory specimens during the acute phase of infection. The lowest  concentration of SARS-CoV-2 viral copies this assay can detect is 250  copies / mL. A negative result does not preclude SARS-CoV-2 infection  and should not be used as the sole basis for treatment or other  patient management decisions.  A negative result may occur with  improper specimen collection / handling, submission of specimen other  than nasopharyngeal swab, presence of viral mutation(s) within the  areas targeted by this assay, and inadequate number of viral copies  (<250 copies / mL). A negative result must be combined with clinical  observations, patient history, and epidemiological information. If result is POSITIVE SARS-CoV-2 target nucleic acids are DETECTED. The SARS-CoV-2 RNA is generally detectable in  upper and lower  respiratory specimens dur ing the acute phase of infection.  Positive  results are indicative of active infection with SARS-CoV-2.  Clinical  correlation with patient history and other diagnostic information is  necessary to determine patient infection status.  Positive results do  not rule out bacterial infection or co-infection with other viruses. If result is PRESUMPTIVE POSTIVE SARS-CoV-2 nucleic acids MAY BE PRESENT.   A presumptive positive result was obtained on the submitted specimen  and confirmed on repeat testing.  While 2019 novel coronavirus  (SARS-CoV-2) nucleic acids may be present in the submitted sample  additional confirmatory testing may be necessary for epidemiological  and / or clinical management purposes  to differentiate between  SARS-CoV-2 and other Sarbecovirus currently known to infect humans.  If clinically  indicated additional testing with an alternate test  methodology [id number]) is advised. The SARS-CoV-2 RNA is generally  detectable in upper and lower respiratory sp ecimens during the acute  phase of infection. The expected result is Negative. Fact Sheet for Patients:  StrictlyIdeas.no Fact Sheet for Healthcare Providers: BankingDealers.co.za This test is not yet approved or cleared by the [place] FDA and has been authorized for detection and/or diagnosis of SARS-CoV-2 by FDA under an Emergency Use Authorization (EUA).  This EUA will remain in effect (meaning this test can be used) for the duration of the COVID-19 declaration under Section 564(b)(1) of the Act, 21 U.S.C. section 360bbb-3(b)(1), unless the authorization is terminated or revoked sooner. Performed at [facility], [place] [address], [place], [place] [address]   MRSA PCR Screening     Status: None   Collection Time: 09/20/19  1:25 AM   Specimen: Nasopharyngeal  Result Value Ref Range Status   MRSA by PCR NEGATIVE NEGATIVE Final    Comment:        The GeneXpert MRSA Assay (FDA approved for NASAL specimens only), is one component of a comprehensive MRSA colonization surveillance program. It is not intended to diagnose MRSA infection nor to guide or monitor treatment for MRSA infections. Performed at [facility], [place] [address] [place], [place] [address]       Studies: Ct Chest Wo Contrast  Result Date: 09/20/2019 CLINICAL DATA:  Staging for colon carcinoma. EXAM: CT CHEST WITHOUT CONTRAST TECHNIQUE: Multidetector CT imaging of the chest was performed following the standard protocol without IV contrast. COMPARISON:  MRI of the abdomen, 09/20/2019. CT of the abdomen pelvis, 09/19/2019. FINDINGS: Cardiovascular: Heart is normal in size and configuration. No pericardial effusion. No coronary artery calcifications. Great vessels are normal in caliber. Minor  aortic atherosclerotic calcifications. Mediastinum/Nodes: There is a lobulated masses extends from the AP window region of the mediastinum into the left upper lobe. It measures 6.1 x 4.9 cm transversely at the level of the AP window. It extends for a length of 8.5 cm obliquely from inferomedial to anterolateral. It abuts the left anteromedial pleural margin, which may be invaded. 1.6 cm predominantly hypoattenuating posterior left thyroid lobe nodule. No neck base or axillary masses or enlarged lymph nodes. No other mediastinal masses.  No mediastinal or hilar adenopathy. Trachea and esophagus are unremarkable. Lungs/Pleura: Linear/reticular opacities extend from the left upper lobe mass to the anterior, anterolateral and anteromedial pleural margins. There are no other lung masses. No nodules. Linear opacities are noted in the right lower lobe consistent with atelectasis. Mild centrilobular emphysema. No evidence of pneumonia or pulmonary edema. No pleural effusion or pneumothorax. Upper Abdomen: Poorly defined mass  in the right lobe of the liver better seen on the previous day's study. Distention of the visualized bowel prominently with gas. No acute findings. Musculoskeletal: Degenerative changes of the visualized lower cervical spine. No fracture or acute finding. No osteoblastic or osteolytic lesions. IMPRESSION: 1. Mass extends from the AP window of the mediastinum into the left upper lobe consistent with neoplastic disease, which could be primary or metastatic. 2. There are no other lung masses or nodules to support metastatic disease. 3. No acute findings. 4. Incidental finding a 1.6 cm thyroid nodule on the left. Depending on the outcome of the patient's other findings on this chest CT and prior abdomen and pelvis CT, further evaluation with thyroid ultrasound. If patient is clinically hyperthyroid, consider nuclear medicine thyroid uptake and scan. 5. Mild centrilobular emphysema.  Mild aortic  atherosclerosis. Aortic Atherosclerosis (ICD10-I70.0) and Emphysema (ICD10-J43.9). Electronically Signed   By: Lajean Manes M.D.   On: 09/20/2019 13:55    Scheduled Meds:  Continuous Infusions: . sodium chloride 75 mL/hr at 09/20/19 1819  . aztreonam 1 g (09/21/19 0905)  . metronidazole 500 mg (09/21/19 0629)     LOS: 2 days     Alma Friendly, MD [facility]  If 7PM-7AM, please contact night-coverage [email or web address] 09/21/2019, 11:02 AM

## 2019-09-21 NOTE — Plan of Care (Signed)
  Problem: Education: Goal: Knowledge of General Education information will improve Description: Including pain rating scale, medication(s)/side effects and non-pharmacologic comfort measures Outcome: Progressing   Problem: Health Behavior/Discharge Planning: Goal: Ability to manage health-related needs will improve Outcome: Progressing   Problem: Clinical Measurements: Goal: Ability to maintain clinical measurements within normal limits will improve Outcome: Progressing Goal: Will remain free from infection Outcome: Progressing Goal: Respiratory complications will improve Outcome: Progressing Goal: Cardiovascular complication will be avoided Outcome: Progressing   Problem: Activity: Goal: Risk for activity intolerance will decrease Outcome: Progressing   Problem: Coping: Goal: Level of anxiety will decrease Outcome: Progressing   Problem: Elimination: Goal: Will not experience complications related to bowel motility Outcome: Progressing

## 2019-09-21 NOTE — Progress Notes (Addendum)
Subjective: CC: Patient reports continues generalized abdominal pain and distension. No N/V. No flatus or BM.   Objective: Vital signs in last 24 hours: Temp:  [97.4 F (36.3 C)-98 F (36.7 C)] 97.4 F (36.3 C) (10/03 0506) Pulse Rate:  [61-87] 79 (10/03 0506) Resp:  [17-25] 17 (10/03 0506) BP: (103-143)/(51-88) 127/80 (10/03 0506) SpO2:  [97 %-100 %] 99 % (10/03 0506) Last BM Date: 09/20/19  Intake/Output from previous day: 10/02 0701 - 10/03 0700 In: 2124.6 [I.V.:1524.6; IV Piggyback:600] Out: 75 [Urine:75] Intake/Output this shift: No intake/output data recorded.  PE: Gen:  Alert, NAD, pleasant HEENT: EOM's intact, pupils equal and round Card:  RRR, no M/G/R heard Pulm:  CTAB, no W/R/R, effort normal Abd: Distended but soft, generalized tenderness without peritonitis. High pitched bowel sounds.  Ext:  No erythema, edema, or tenderness BUE/BLE  Psych: A&Ox3  Skin: no rashes noted, warm and dry  Lab Results:  Recent Labs    09/20/19 0430 09/21/19 0317  WBC 14.9* 15.0*  HGB 11.9* 11.5*  HCT 35.9* 34.8*  PLT 350 354   BMET Recent Labs    09/20/19 0430 09/21/19 0317  NA 138 137  K 3.6 3.2*  CL 100 103  CO2 26 22  GLUCOSE 106* 100*  BUN 19 20  CREATININE 1.28* 1.11  CALCIUM 8.6* 8.2*   PT/INR No results for input(s): LABPROT, INR in the last 72 hours. CMP     Component Value Date/Time   NA 137 09/21/2019 0317   NA 143 12/27/2012   K 3.2 (L) 09/21/2019 0317   CL 103 09/21/2019 0317   CO2 22 09/21/2019 0317   GLUCOSE 100 (H) 09/21/2019 0317   BUN 20 09/21/2019 0317   BUN 9 12/27/2012   CREATININE 1.11 09/21/2019 0317   CALCIUM 8.2 (L) 09/21/2019 0317   PROT 7.2 09/19/2019 1658   ALBUMIN 2.8 (L) 09/19/2019 1658   AST 31 09/19/2019 1658   ALT 24 09/19/2019 1658   ALKPHOS 108 09/19/2019 1658   BILITOT 0.9 09/19/2019 1658   GFRNONAA >60 09/21/2019 0317   GFRAA >60 09/21/2019 0317   Lipase     Component Value Date/Time   LIPASE 21  09/19/2019 1658       Studies/Results: Ct Chest Wo Contrast  Result Date: 09/20/2019 CLINICAL DATA:  Staging for colon carcinoma. EXAM: CT CHEST WITHOUT CONTRAST TECHNIQUE: Multidetector CT imaging of the chest was performed following the standard protocol without IV contrast. COMPARISON:  MRI of the abdomen, 09/20/2019. CT of the abdomen pelvis, 09/19/2019. FINDINGS: Cardiovascular: Heart is normal in size and configuration. No pericardial effusion. No coronary artery calcifications. Great vessels are normal in caliber. Minor aortic atherosclerotic calcifications. Mediastinum/Nodes: There is a lobulated masses extends from the AP window region of the mediastinum into the left upper lobe. It measures 6.1 x 4.9 cm transversely at the level of the AP window. It extends for a length of 8.5 cm obliquely from inferomedial to anterolateral. It abuts the left anteromedial pleural margin, which may be invaded. 1.6 cm predominantly hypoattenuating posterior left thyroid lobe nodule. No neck base or axillary masses or enlarged lymph nodes. No other mediastinal masses.  No mediastinal or hilar adenopathy. Trachea and esophagus are unremarkable. Lungs/Pleura: Linear/reticular opacities extend from the left upper lobe mass to the anterior, anterolateral and anteromedial pleural margins. There are no other lung masses. No nodules. Linear opacities are noted in the right lower lobe consistent with atelectasis. Mild centrilobular emphysema. No evidence of  pneumonia or pulmonary edema. No pleural effusion or pneumothorax. Upper Abdomen: Poorly defined mass in the right lobe of the liver better seen on the previous day's study. Distention of the visualized bowel prominently with gas. No acute findings. Musculoskeletal: Degenerative changes of the visualized lower cervical spine. No fracture or acute finding. No osteoblastic or osteolytic lesions. IMPRESSION: 1. Mass extends from the AP window of the mediastinum into the left  upper lobe consistent with neoplastic disease, which could be primary or metastatic. 2. There are no other lung masses or nodules to support metastatic disease. 3. No acute findings. 4. Incidental finding a 1.6 cm thyroid nodule on the left. Depending on the outcome of the patient's other findings on this chest CT and prior abdomen and pelvis CT, further evaluation with thyroid ultrasound. If patient is clinically hyperthyroid, consider nuclear medicine thyroid uptake and scan. 5. Mild centrilobular emphysema.  Mild aortic atherosclerosis. Aortic Atherosclerosis (ICD10-I70.0) and Emphysema (ICD10-J43.9). Electronically Signed   By: Lajean Manes M.D.   On: 09/20/2019 13:55   Ct Abdomen Pelvis W Contrast  Result Date: 09/19/2019 CLINICAL DATA:  78 year old male with abdominal pain and swelling. EXAM: CT ABDOMEN AND PELVIS WITH CONTRAST TECHNIQUE: Multidetector CT imaging of the abdomen and pelvis was performed using the standard protocol following bolus administration of intravenous contrast. CONTRAST:  140mL OMNIPAQUE IOHEXOL 300 MG/ML  SOLN COMPARISON:  Abdominal radiograph dated 09/19/2019 and CT of the abdomen pelvis dated 05/17/2013 FINDINGS: Lower chest: The visualized lung bases are clear. There is mild emphysema. No intra-abdominal free air. No free fluid. Hepatobiliary: There is a 7.5 x 7.0 cm heterogeneously enhancing mass in the right lobe of the liver (segment VII). This mass is suboptimally characterized but concerning for a malignancy such as hepatocellular carcinoma. Further evaluation with MRI without and with contrast a nonemergent basis recommended. No intrahepatic biliary ductal dilatation. The gallbladder is unremarkable. Pancreas: Pancreas is grossly unremarkable as visualized. Spleen: Normal in size without focal abnormality. Adrenals/Urinary Tract: The adrenal glands are unremarkable. There is a 5 mm right renal inferior pole nonobstructing stone. There is no hydronephrosis on either side.  Bilateral renal cysts measure up to 2.5 cm in the interpolar aspect of the right kidney. There is a punctate nonobstructing left renal upper pole calculus. There is symmetric enhancement and excretion of contrast by both kidneys. There is diffuse thickened appearance of bladder wall which may be partly related to underdistention or represent cystitis. An infiltrative process is not excluded. Stomach/Bowel: There is segmental thickening of the sigmoid colon. There is a 7.3 x 4.5 cm collection containing debris and air within the pelvis posterior to the urinary bladder and anterior to the rectosigmoid. There is mild diffuse inflammatory changes of the pelvis and perirectal fat. This complex collection is not well characterized but may represent an abscess, and inflamed large diverticula, or a malignancy with a large ulceration. There is distention of the colon with air and stool proximal to the thickened segment of the sigmoid colon. The cecum is located in the mid abdomen. No evidence of twisting or volvulus. There is no definite evidence of small-bowel obstruction. The appendix is normal. Vascular/Lymphatic: There is moderate aortoiliac atherosclerotic disease. The IVC is unremarkable. No portal venous gas. There is no adenopathy. Reproductive: Mildly enlarged prostate gland with multiple fiducial markers. Prostate measures approximately 5.2 cm in transverse axial diameter. The seminal vesicles are symmetric. Other: There is loss of subcutaneous and mesenteric fat and cachexia. Musculoskeletal: No acute or significant osseous findings. IMPRESSION:  1. Segmental thickening of the sigmoid colon with adjacent inflammatory changes. A 7.3 x 4.5 cm complex collection containing debris and air may represent an abscess or a large malignant ulceration. CT of the pelvis with rectal contrast may provide better evaluation. The thickened appearance of the sigmoid colon may be infectious or inflammatory in etiology or represent  infiltrative neoplasm. Further evaluation with sigmoidoscopy following resolution of acute inflammatory changes recommended. 2. Diffuse distention of the colon proximal to the thickened segment of sigmoid. No valvular. No evidence of small-bowel obstruction. Normal appendix. 3. A 7.5 x 7.0 cm heterogeneously enhancing mass in the right lobe of the liver most consistent with malignancy such as hepatocellular carcinoma. Further evaluation with MRI without and with contrast a nonemergent basis recommended. 4. Aortic Atherosclerosis (ICD10-I70.0). 5. Cachexia. Electronically Signed   By: Anner Crete M.D.   On: 09/19/2019 21:15   Mr Liver W Wo Contrast  Addendum Date: 09/20/2019   ADDENDUM REPORT: 09/20/2019 08:31 ADDENDUM: These results were called by telephone at the time of interpretation on 09/20/2019 at 8:30 am to provider Dr. Horris Latino , who verbally acknowledged these results. Low T2 signal likely relates to fibrotic changes within this lesion. Electronically Signed   By: Zetta Bills M.D.   On: 09/20/2019 08:31   Result Date: 09/20/2019 CLINICAL DATA:  History hepatic malignancy presenting to the emergency department generalized abdominal pain, worsening past month. EXAM: MRI ABDOMEN WITHOUT AND WITH CONTRAST TECHNIQUE: Multiplanar multisequence MR imaging of the abdomen was performed both before and after the administration of intravenous contrast. CONTRAST:  106mL GADAVIST GADOBUTROL 1 MMOL/ML IV SOLN COMPARISON:  CT evaluation 09/20/2019. FINDINGS: Lower chest: No signs of pleural effusion or definite pulmonary abnormality. Hepatobiliary: 7.9 x 7.5 7.4 cm mass in the posterior right hepatic lobe is poorly enhancing, mainly in the periphery. Lesion displays mixed T2 signal. No signs chronic liver disease portal hypertension. Cholelithiasis. No biliary ductal dilation. Pancreas: No mass, inflammatory changes, or other parenchymal abnormality identified. Spleen:  Within normal limits in size and  appearance. Adrenals/Urinary Tract: No masses identified. No evidence of hydronephrosis. Bilateral renal cysts. Stomach/Bowel: Bowel distention is similar to the previous exam. The sigmoid colon not assessed on the current examination. Vascular/Lymphatic: Patent abdominal vasculature. Retroaortic left renal vein. No adenopathy. Other:  None. Musculoskeletal: No signs acute musculoskeletal finding. IMPRESSION: Large right hepatic mass lesion which is poorly enhancing. Constellation of findings favors metastatic lesion from obstructing colon cancer leading to contained perforation adjacent to the sigmoid colon. Primary hepatic neoplasm is felt less likely. No signs of vascular invasion or adenopathy. The presence of susceptibility in the setting of bowel distension limits assessment. Electronically Signed: By: Zetta Bills M.D. On: 09/20/2019 07:59   Dg Abd 2 Views  Result Date: 09/19/2019 CLINICAL DATA:  Distended abdomen EXAM: ABDOMEN - 2 VIEW COMPARISON:  None. FINDINGS: Markedly distended colon extending to the rectum. Mild amount of small bowel gas. No free air. No abnormal calcifications or skeletal lesion. IMPRESSION: Markedly distended colon to the rectum. Findings most compatible with colonic ileus although rectal obstruction possible. Electronically Signed   By: Franchot Gallo M.D.   On: 09/19/2019 16:01    Anti-infectives: Anti-infectives (From admission, onward)   Start     Dose/Rate Route Frequency Ordered Stop   09/20/19 0800  aztreonam (AZACTAM) 1 g in sodium chloride 0.9 % 100 mL IVPB     1 g 200 mL/hr over 30 Minutes Intravenous Every 8 hours 09/19/19 2246     09/20/19  0700  metroNIDAZOLE (FLAGYL) IVPB 500 mg     500 mg 100 mL/hr over 60 Minutes Intravenous Every 8 hours 09/19/19 2310     09/19/19 2245  aztreonam (AZACTAM) 2 g in sodium chloride 0.9 % 100 mL IVPB     2 g 200 mL/hr over 30 Minutes Intravenous  Once 09/19/19 2236 09/19/19 2332   09/19/19 2245  metroNIDAZOLE (FLAGYL)  IVPB 500 mg     500 mg 100 mL/hr over 60 Minutes Intravenous  Once 09/19/19 2236 09/20/19 0110       Assessment/Plan Hx prostate CA s/p XRT HLD COPD Hypokalemia   LBO 2/2 sigmoid colon mass vs infectious process w/ contained perforation  Right hepatic mass concerning for metastatic lesion  Mediastinal mass extending into the left upper lobe concerning for neoplastic disease - CEA 10.6 - AFP 2.8 - Continue IV abx - Will likely require ex lap next week for diversion vs colectomy w/ creation of colostomy. Will discuss with MD timing of this.  - NGT for proximal decompression   FEN - NPO, NGT, IVF VTE - SCDs, okay for chemical prophylaxis from a surgical standpoint ID - Currently Azactam and Flagyl    LOS: 2 days    Jillyn Ledger , [facility] 09/21/2019, 10:17 AM Pager: [phone]

## 2019-09-21 NOTE — Progress Notes (Signed)
   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

## 2019-09-22 ENCOUNTER — Inpatient Hospital Stay (HOSPITAL_COMMUNITY): Payer: Medicare Other

## 2019-09-22 LAB — CBC WITH DIFFERENTIAL/PLATELET
Abs Immature Granulocytes: 0.14 10*3/uL — ABNORMAL HIGH (ref 0.00–0.07)
Basophils Absolute: 0 10*3/uL (ref 0.0–0.1)
Basophils Relative: 0 %
Eosinophils Absolute: 0 10*3/uL (ref 0.0–0.5)
Eosinophils Relative: 0 %
HCT: 32.9 % — ABNORMAL LOW (ref 39.0–52.0)
Hemoglobin: 11 g/dL — ABNORMAL LOW (ref 13.0–17.0)
Immature Granulocytes: 1 %
Lymphocytes Relative: 3 %
Lymphs Abs: 0.5 10*3/uL — ABNORMAL LOW (ref 0.7–4.0)
MCH: 31.1 pg (ref 26.0–34.0)
MCHC: 33.4 g/dL (ref 30.0–36.0)
MCV: 92.9 fL (ref 80.0–100.0)
Monocytes Absolute: 1.1 10*3/uL — ABNORMAL HIGH (ref 0.1–1.0)
Monocytes Relative: 6 %
Neutro Abs: 15.3 10*3/uL — ABNORMAL HIGH (ref 1.7–7.7)
Neutrophils Relative %: 90 %
Platelets: 385 10*3/uL (ref 150–400)
RBC: 3.54 MIL/uL — ABNORMAL LOW (ref 4.22–5.81)
RDW: 13.3 % (ref 11.5–15.5)
WBC: 17 10*3/uL — ABNORMAL HIGH (ref 4.0–10.5)
nRBC: 0 % (ref 0.0–0.2)

## 2019-09-22 LAB — BASIC METABOLIC PANEL
Anion gap: 15 (ref 5–15)
BUN: 32 mg/dL — ABNORMAL HIGH (ref 8–23)
CO2: 21 mmol/L — ABNORMAL LOW (ref 22–32)
Calcium: 8.5 mg/dL — ABNORMAL LOW (ref 8.9–10.3)
Chloride: 109 mmol/L (ref 98–111)
Creatinine, Ser: 1.58 mg/dL — ABNORMAL HIGH (ref 0.61–1.24)
GFR calc Af Amer: 47 mL/min — ABNORMAL LOW (ref >60)
GFR calc non Af Amer: 41 mL/min — ABNORMAL LOW (ref >60)
Glucose, Bld: 107 mg/dL — ABNORMAL HIGH (ref 70–99)
Potassium: 3.4 mmol/L — ABNORMAL LOW (ref 3.5–5.1)
Sodium: 145 mmol/L (ref 135–145)

## 2019-09-22 MED ORDER — POTASSIUM CHLORIDE 10 MEQ/100ML IV SOLN
10.0000 meq | INTRAVENOUS | Status: AC
  Administered 2019-09-22 (×4): 10 meq via INTRAVENOUS
  Filled 2019-09-22 (×4): qty 100

## 2019-09-22 NOTE — Progress Notes (Signed)
   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

## 2019-09-22 NOTE — Progress Notes (Signed)
PROGRESS NOTE  Charles Espinoza [id number] DOB: July 15, 1941 DOA: 09/19/2019 PCP: Rogers Blocker, MD  HPI/Recap of past 24 hours: HPI from Dr Dutch Gray Akerman is a 78 y.o. male with medical history significant of prostate CA s/p radiation in Jan 2015, COPD presents to the ED with c/o generalized abd pain for the past 1 month, worsening over the past 1 week with abd distention. Patient also c/o loose watery stool and still passing gas. In ED, CT shows no volvulus, but does show a large 7.5x4.5 complex sigmoid collection possibly abscess vs malignant ulceration as well as 7.5x7.0 cm hepatic lesion. Pt with WBC 16k. Pt started on aztreonam and flagyl.  Gen surg consulted and asked medicine to admit.   Today, patient still reports abdominal pain, with distention.  Denies any nausea/vomiting, bowel movements, fever/chills, chest pain.    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 for now, NG tube  Liver lesion, likely mets Hepatitis panel unremarkable AFP tumor marker 2.8 MRI showed large right hepatic mass lesion, findings favors metastatic lesion from obstructing colon cancer leading to contained perforation adjacent to  the sigmoid colon. Primary hepatic neoplasm is felt less likely GI consulted, signed off  Hypokalemia Replace PRN  AKI Worsening Last creatinine was 1.2 in 2014 Likely due to above Continue IV fluids Daily BMP  Anemia of chronic disease Likely due to above Daily CBC  History of prostate CA Status post completion of radiation in 2015 PSA 0.53        Malnutrition Type:      Malnutrition Characteristics:      Nutrition Interventions:       Estimated body mass index is 23.57 kg/m as calculated from the following:   Height as of this encounter: 5\' 8"  (1.727 m).   Weight as of this encounter: 70.3 kg.     Code Status: Full  Family Communication: None at bedside  Disposition Plan: To be determined   Consultants:  General surgery  GI  Procedures:  None  Antimicrobials:  Aztreonam  Flagyl  DVT prophylaxis: Lovenox   Objective: Vitals:   09/21/19 0506 09/21/19 1512 09/21/19 2200 09/22/19 0449  BP: 127/80 133/90 120/76 (!) 143/74  Pulse: 79 90 91 85  Resp: 17 18 20  (!) 21  Temp: (!) 97.4 F (36.3 C) 98.3 F (36.8 C) 98.1 F (36.7 C) 98.5 F (36.9 C)  TempSrc: Oral Oral Oral Oral  SpO2: 99% 100% 99% 99%  Weight:      Height:        Intake/Output Summary (Last 24 hours) at 09/22/2019 1634 Last data filed at 09/22/2019 0046 Gross per 24 hour  Intake 0 ml  Output 800 ml  Net -800 ml   Filed Weights   09/19/19 2200  Weight: 70.3 kg    Exam:  General: NAD, mild  distress  Cardiovascular: S1, S2 present  Respiratory: CTAB  Abdomen: Soft, tense, +generalized tenderness, ++ distended, bowel sounds present  Musculoskeletal: No bilateral pedal edema noted  Skin: Normal  Psychiatry: Normal mood   Data Reviewed: CBC: Recent Labs  Lab 09/19/19 1658 09/20/19 0430 09/21/19 0317 09/22/19 0411  WBC 16.1* 14.9* 15.0* 17.0*  NEUTROABS  --   --  13.0* 15.3*  HGB 12.4* 11.9* 11.5* 11.0*  HCT 37.8* 35.9* 34.8* 32.9*  MCV  95.0 93.0 93.3 92.9  PLT 372 350 354 0000000   Basic Metabolic Panel: Recent Labs  Lab 09/19/19 1658 09/20/19 0430 09/21/19 0317 09/22/19 0411  NA 138 138 137 145  K 3.8 3.6 3.2* 3.4*  CL 100 100 103 109  CO2 23 26 22  21*  GLUCOSE 108* 106* 100* 107*  BUN 19 19 20  32*  CREATININE 1.28* 1.28* 1.11 1.58*  CALCIUM 8.8* 8.6* 8.2* 8.5*   GFR: Estimated Creatinine Clearance: 36.1 mL/min (A) (by C-G formula based on SCr of 1.58 mg/dL (H)). Liver Function Tests: Recent Labs  Lab 09/19/19 1658  AST 31  ALT 24  ALKPHOS 108  BILITOT 0.9  PROT 7.2  ALBUMIN 2.8*   Recent Labs  Lab 09/19/19 1658  LIPASE 21   No results for input(s): AMMONIA in the last 168 hours. Coagulation Profile: No results for input(s): INR, PROTIME in the last 168 hours. Cardiac Enzymes: No results for input(s): CKTOTAL, CKMB, CKMBINDEX, TROPONINI in the last 168 hours. BNP (last 3 results) No results for input(s): PROBNP in the last 8760 hours. HbA1C: No results for input(s): HGBA1C in the last 72 hours. CBG: No results for input(s): GLUCAP in the last 168 hours. Lipid Profile: No results for input(s): CHOL, HDL, LDLCALC, TRIG, CHOLHDL, LDLDIRECT in the last 72 hours. Thyroid Function Tests: No results for input(s): TSH, T4TOTAL, FREET4, T3FREE, THYROIDAB in the last 72 hours. Anemia Panel: No results for input(s): VITAMINB12, FOLATE, FERRITIN, TIBC, IRON, RETICCTPCT in the last 72 hours. Urine analysis:    Component Value Date/Time   COLORURINE AMBER (A) 09/19/2019 1920   APPEARANCEUR HAZY (A) 09/19/2019 1920   LABSPEC 1.021 09/19/2019 1920   PHURINE 5.0 09/19/2019 1920   GLUCOSEU NEGATIVE 09/19/2019 1920   HGBUR MODERATE (A) 09/19/2019 1920   BILIRUBINUR NEGATIVE 09/19/2019 1920   KETONESUR NEGATIVE 09/19/2019 1920   PROTEINUR 100 (A) 09/19/2019 1920   NITRITE NEGATIVE 09/19/2019 1920   LEUKOCYTESUR NEGATIVE 09/19/2019 1920   Sepsis Labs: @LABRCNTIP (procalcitonin:4,lacticidven:4)  )  Recent Results (from the past 240 hour(s))  SARS Coronavirus 2 [facility] order, Performed in [facility] hospital lab) Nasopharyngeal Nasopharyngeal Swab     Status: None   Collection Time: 09/19/19  9:43 PM   Specimen: Nasopharyngeal Swab  Result Value Ref Range Status   SARS Coronavirus 2 NEGATIVE NEGATIVE Final    Comment: (NOTE) If result is NEGATIVE SARS-CoV-2 target nucleic acids are NOT DETECTED. The SARS-CoV-2 RNA is generally detectable in upper and lower  respiratory specimens during the acute phase of infection. The lowest  concentration of SARS-CoV-2 viral copies this assay can detect is 250  copies / mL. A negative result does not preclude SARS-CoV-2 infection  and should not be used as the sole basis for treatment or other  patient management decisions.  A negative result may occur with  improper specimen collection / handling, submission of specimen other  than nasopharyngeal swab, presence of viral mutation(s) within the  areas targeted by this assay, and inadequate number of viral copies  (<  250 copies / mL). A negative result must be combined with clinical  observations, patient history, and epidemiological information. If result is POSITIVE SARS-CoV-2 target nucleic acids are DETECTED. The SARS-CoV-2 RNA is generally detectable in upper and lower  respiratory specimens dur ing the acute phase of infection.  Positive  results are indicative of active infection with SARS-CoV-2.  Clinical  correlation with patient history and other diagnostic information is  necessary to determine patient infection status.  Positive results do  not rule out bacterial infection or co-infection with other viruses. If result is PRESUMPTIVE POSTIVE SARS-CoV-2 nucleic acids MAY BE PRESENT.   A presumptive positive result was obtained on the submitted specimen  and confirmed on repeat testing.  While 2019 novel coronavirus  (SARS-CoV-2) nucleic acids may be present in the submitted sample   additional confirmatory testing may be necessary for epidemiological  and / or clinical management purposes  to differentiate between  SARS-CoV-2 and other Sarbecovirus currently known to infect humans.  If clinically indicated additional testing with an alternate test  methodology [phone]) is advised. The SARS-CoV-2 RNA is generally  detectable in upper and lower respiratory sp ecimens during the acute  phase of infection. The expected result is Negative. Fact Sheet for Patients:  StrictlyIdeas.no Fact Sheet for Healthcare Providers: BankingDealers.co.za This test is not yet approved or cleared by the [place] FDA and has been authorized for detection and/or diagnosis of SARS-CoV-2 by FDA under an Emergency Use Authorization (EUA).  This EUA will remain in effect (meaning this test can be used) for the duration of the COVID-19 declaration under Section 564(b)(1) of the Act, 21 U.S.C. section 360bbb-3(b)(1), unless the authorization is terminated or revoked sooner. Performed at [facility], [place] [address] [place], [place] [address]   MRSA PCR Screening     Status: None   Collection Time: 09/20/19  1:25 AM   Specimen: Nasopharyngeal  Result Value Ref Range Status   MRSA by PCR NEGATIVE NEGATIVE Final    Comment:        The GeneXpert MRSA Assay (FDA approved for NASAL specimens only), is one component of a comprehensive MRSA colonization surveillance program. It is not intended to diagnose MRSA infection nor to guide or monitor treatment for MRSA infections. Performed at [facility], [place] [address], [place], [place] [address]       Studies: Ct Abdomen Pelvis Wo Contrast  Result Date: 09/22/2019 CLINICAL DATA:  78 year old male with free air on prior plain film EXAM: CT ABDOMEN AND PELVIS WITHOUT CONTRAST TECHNIQUE: Multidetector CT imaging of the abdomen and pelvis was performed following the standard  protocol without IV contrast. COMPARISON:  Plain film 09/22/2019, CT 09/19/2019 FINDINGS: Lower chest: Atelectasis at the lung bases with respiratory motion somewhat limiting the lung evaluation. Gastric tube terminates at the GE junction. Hepatobiliary: The previous identified right liver mass is incompletely characterized on the current CT. Unremarkable gallbladder. No radiopaque cholelithiasis. Pancreas: Unremarkable Spleen: Unremarkable Adrenals/Urinary Tract: Unremarkable adrenal glands. Fullness in the bilateral collecting system with distention of the urinary. Bilateral cysts of the kidneys, likely Bosniak 1 cysts. Stomach/Bowel: Stomach is decompressed. Similar degree mild distention of small bowel loops. Similar degree of colonic distention with moderate to large stool burden. The irregular appearance of the distal rectum, potentially a rectal carcinoma and better characterized on prior CT, is similar in appearance. There is evidence of ongoing abscess in the perirectal region, with fluid and gas collection measuring transversely 7.9 cm. Evidence of  free air in the left and right subdiaphragmatic region corresponding to findings on prior plain film. Vascular/Lymphatic: Mild atherosclerotic changes of the aorta and iliac arteries. Reproductive: Prostate measures 5.2 cm transverse diameter with impression on the bladder base. Other: Body wall edema and mesenteric edema/anasarca. Musculoskeletal: . no acute displaced fracture. Degenerative changes of the spine. IMPRESSION: CT demonstrates free air in the bilateral subdiaphragmatic region, compatible with hollow viscus perforation. The precise site of perforation is not identified on the current CT, however, the presumed diagnosis is a perforated and obstructing rectal colon carcinoma, of which this would be the most likely source of the perforation. Persisting evidence of colonic obstruction, with moderate to large stool burden. Metastatic disease to the liver  again demonstrated. Perirectal abscess measuring 7.9 cm at the site of the presumed rectal cancer. These results were discussed by telephone at the time of interpretation on 09/22/2019 at 3:14 pm with Dr. Georganna Skeans of surgery. Distention of urinary bladder, which is the presumed etiology of mild bilateral dilation of the collecting system. Body wall/mesenteric edema/anasarca. Additional ancillary findings as above. Electronically Signed   By: Corrie Mckusick D.O.   On: 09/22/2019 15:17   Dg Abd 2 Views  Addendum Date: 09/22/2019   ADDENDUM REPORT: 09/22/2019 12:09 ADDENDUM: Above findings discussed with Dr. Johnathan Hausen at 11:34 a.m. Electronically Signed   By: Marin Olp M.D.   On: 09/22/2019 12:09   Result Date: 09/22/2019 CLINICAL DATA:  Colonic obstruction. EXAM: ABDOMEN - 2 VIEW COMPARISON:  09/21/2019 FINDINGS: Nasogastric tube has been pulled back as tip is located in the expected region of the distal esophagus just above the gastroesophageal junction. This could be advanced 8-10 cm. Continued air-filled loops of large and small bowel with moderate fecal retention over the right colon and distal descending colon. Collection of air under the right hemidiaphragm on the upright film which may represent free peritoneal air. No definite dilated small bowel loops. Persistent mildly distended bladder. Remainder the exam is unchanged. IMPRESSION: Persistent multiple air-filled loops of large and small bowel with moderate fecal retention over the right and left colon. No definite dilated small bowel loops. Possible free air under the right hemidiaphragm. Right lateral decubitus film versus CT could confirm this finding. Nasogastric tube with tip over the region of the distal esophagus just above the gastroesophageal junction. This could be advanced 8-10 cm. Electronically Signed: By: Marin Olp M.D. On: 09/22/2019 11:25    Scheduled Meds: . enoxaparin (LOVENOX) injection  40 mg Subcutaneous Q24H     Continuous Infusions: . sodium chloride 75 mL/hr at 09/22/19 0509  . aztreonam 1 g (09/22/19 0852)  . metronidazole 500 mg (09/22/19 1538)     LOS: 3 days     Alma Friendly, MD [facility]  If 7PM-7AM, please contact night-coverage [email or web address] 09/22/2019, 4:34 PM

## 2019-09-22 NOTE — Progress Notes (Signed)
Patient ID: Charles Espinoza, male   DOB: 15-Jul-1941, 78 y.o.   MRN: [id number] Reviewed CT with Dr. Earleen Newport and I discussed with Dr. Hassell Done. Very likely a perforated rectal cancer that has been leaking some air. Also has large liver mass. No peritonitis. Continue IV ABX and NGT. Will discuss further with Dr. Ninfa Linden and plan for diversion tomorrow.  Georganna Skeans, MD, MPH, FACS Trauma & General Surgery: [phone]

## 2019-09-22 NOTE — Progress Notes (Addendum)
Pharmacy Antibiotic Note  Charles Espinoza is a 78 y.o. male admitted on 09/19/2019 with abdominal pain. Pharmacy has been consulted for Azactam dosing for intra-abdominal infection.  No cephalosporin use in the past. Metronidazole per [facility] team. WBC up to 17 today, Scr up 1.11>1.58; CrCl 36.1, afebrile.  Surgery to repeat abdominal xray to assess distention of colon. Per surgery, patient will likely require ex lap next week for diversion vs colectomy w/ creation of colostomy.  Plan: Continue Azactam 1gm IV Q8H Continue Metronidazole 500mg  IV q8h Monitor renal fxn, clinical progress  Height: 5\' 8"  (172.7 cm) Weight: 155 lb (70.3 kg) IBW/kg (Calculated) : 68.4  Temp (24hrs), Avg:98.3 F (36.8 C), Min:98.1 F (36.7 C), Max:98.5 F (36.9 C)  Recent Labs  Lab 09/19/19 1658 09/20/19 0430 09/21/19 0317 09/22/19 0411  WBC 16.1* 14.9* 15.0* 17.0*  CREATININE 1.28* 1.28* 1.11 1.58*    Estimated Creatinine Clearance: 36.1 mL/min (A) (by C-G formula based on SCr of 1.58 mg/dL (H)).    Allergies  Allergen Reactions  . Penicillins     Did it involve swelling of the face/tongue/throat, SOB, or low BP? n/a Did it involve sudden or severe rash/hives, skin peeling, or any reaction on the inside of your mouth or nose? n/a Did you need to seek medical attention at a hospital or doctor's office? n/a When did it last happen?[id number] If all above answers are "NO", may proceed with cephalosporin use.   Antibiotics Azactam 10/1 >> Metronidazole 10/2 >>  Microbiology 10/1 covid -  10/2 MRSA PCR negative  Thank you for the interesting consult and for involving pharmacy in this patient's care.  Tamela Gammon, PharmD 09/22/2019 9:36 AM PGY-2 Pharmacy Administration Resident Direct Phone: [phone] Please check [email or web address] for unit-specific pharmacist phone numbers

## 2019-09-22 NOTE — Progress Notes (Addendum)
Patient ID: Charles Espinoza, male   DOB: November 11, 1941, 78 y.o.   MRN: [id number] [facility] Surgery Progress Note:   * No surgery found *  Subjective: Mental status is clear.  Patient got up and stretched his NG tube to the toilet to have a BM and urinate.   Objective: Vital signs in last 24 hours: Temp:  [98.1 F (36.7 C)-98.5 F (36.9 C)] 98.5 F (36.9 C) (10/04 0449) Pulse Rate:  [85-91] 85 (10/04 0449) Resp:  [18-21] 21 (10/04 0449) BP: (120-143)/(74-90) 143/74 (10/04 0449) SpO2:  [99 %-100 %] 99 % (10/04 0449)  Intake/Output from previous day: 10/03 0701 - 10/04 0700 In: 0  Out: 800 [Emesis/NG output:800] Intake/Output this shift: No intake/output data recorded.  Physical Exam: Work of breathing is not labored.  Abdomen is distended and will need colon resection and ostomy .    Lab Results:  Results for orders placed or performed during the hospital encounter of 09/19/19 (from the past 48 hour(s))  CBC with Differential/Platelet     Status: Abnormal   Collection Time: 09/21/19  3:17 AM  Result Value Ref Range   WBC 15.0 (H) 4.0 - 10.5 K/uL   RBC 3.73 (L) 4.22 - 5.81 MIL/uL   Hemoglobin 11.5 (L) 13.0 - 17.0 g/dL   HCT 34.8 (L) 39.0 - 52.0 %   MCV 93.3 80.0 - 100.0 fL   MCH 30.8 26.0 - 34.0 pg   MCHC 33.0 30.0 - 36.0 g/dL   RDW 13.2 11.5 - 15.5 %   Platelets 354 150 - 400 K/uL   nRBC 0.0 0.0 - 0.2 %   Neutrophils Relative % 87 %   Neutro Abs 13.0 (H) 1.7 - 7.7 K/uL   Lymphocytes Relative 4 %   Lymphs Abs 0.7 0.7 - 4.0 K/uL   Monocytes Relative 8 %   Monocytes Absolute 1.2 (H) 0.1 - 1.0 K/uL   Eosinophils Relative 0 %   Eosinophils Absolute 0.0 0.0 - 0.5 K/uL   Basophils Relative 0 %   Basophils Absolute 0.0 0.0 - 0.1 K/uL   Immature Granulocytes 1 %   Abs Immature Granulocytes 0.18 (H) 0.00 - 0.07 K/uL    Comment: Performed at [facility], [address], [place], [place] [id number]  Basic metabolic panel     Status: Abnormal   Collection Time: 09/21/19   3:17 AM  Result Value Ref Range   Sodium 137 135 - 145 mmol/L   Potassium 3.2 (L) 3.5 - 5.1 mmol/L   Chloride 103 98 - 111 mmol/L   CO2 22 22 - 32 mmol/L   Glucose, Bld 100 (H) 70 - 99 mg/dL   BUN 20 8 - 23 mg/dL   Creatinine, Ser 1.11 0.61 - 1.24 mg/dL   Calcium 8.2 (L) 8.9 - 10.3 mg/dL   GFR calc non Af Amer >60 >60 mL/min   GFR calc Af Amer >60 >60 mL/min   Anion gap 12 5 - 15    Comment: Performed at [facility], [place] [address], [place], [place] [address]  CBC with Differential/Platelet     Status: Abnormal   Collection Time: 09/22/19  4:11 AM  Result Value Ref Range   WBC 17.0 (H) 4.0 - 10.5 K/uL   RBC 3.54 (L) 4.22 - 5.81 MIL/uL   Hemoglobin 11.0 (L) 13.0 - 17.0 g/dL   HCT 32.9 (L) 39.0 - 52.0 %   MCV 92.9 80.0 - 100.0 fL   MCH 31.1 26.0 - 34.0  pg   MCHC 33.4 30.0 - 36.0 g/dL   RDW 13.3 11.5 - 15.5 %   Platelets 385 150 - 400 K/uL   nRBC 0.0 0.0 - 0.2 %   Neutrophils Relative % 90 %   Neutro Abs 15.3 (H) 1.7 - 7.7 K/uL   Lymphocytes Relative 3 %   Lymphs Abs 0.5 (L) 0.7 - 4.0 K/uL   Monocytes Relative 6 %   Monocytes Absolute 1.1 (H) 0.1 - 1.0 K/uL   Eosinophils Relative 0 %   Eosinophils Absolute 0.0 0.0 - 0.5 K/uL   Basophils Relative 0 %   Basophils Absolute 0.0 0.0 - 0.1 K/uL   Immature Granulocytes 1 %   Abs Immature Granulocytes 0.14 (H) 0.00 - 0.07 K/uL    Comment: Performed at [facility] [address], [place], [place] [id number]  Basic metabolic panel     Status: Abnormal   Collection Time: 09/22/19  4:11 AM  Result Value Ref Range   Sodium 145 135 - 145 mmol/L    Comment: DELTA CHECK NOTED   Potassium 3.4 (L) 3.5 - 5.1 mmol/L   Chloride 109 98 - 111 mmol/L   CO2 21 (L) 22 - 32 mmol/L   Glucose, Bld 107 (H) 70 - 99 mg/dL   BUN 32 (H) 8 - 23 mg/dL   Creatinine, Ser 1.58 (H) 0.61 - 1.24 mg/dL   Calcium 8.5 (L) 8.9 - 10.3 mg/dL   GFR calc non Af Amer 41 (L) >60 mL/min   GFR calc Af Amer 47 (L) >60 mL/min   Anion gap 15 5 - 15     Comment: Performed at [facility] [address] [place], [place] [address]    Radiology/Results: Ct Chest Wo Contrast  Result Date: 09/20/2019 CLINICAL DATA:  Staging for colon carcinoma. EXAM: CT CHEST WITHOUT CONTRAST TECHNIQUE: Multidetector CT imaging of the chest was performed following the standard protocol without IV contrast. COMPARISON:  MRI of the abdomen, 09/20/2019. CT of the abdomen pelvis, 09/19/2019. FINDINGS: Cardiovascular: Heart is normal in size and configuration. No pericardial effusion. No coronary artery calcifications. Great vessels are normal in caliber. Minor aortic atherosclerotic calcifications. Mediastinum/Nodes: There is a lobulated masses extends from the AP window region of the mediastinum into the left upper lobe. It measures 6.1 x 4.9 cm transversely at the level of the AP window. It extends for a length of 8.5 cm obliquely from inferomedial to anterolateral. It abuts the left anteromedial pleural margin, which may be invaded. 1.6 cm predominantly hypoattenuating posterior left thyroid lobe nodule. No neck base or axillary masses or enlarged lymph nodes. No other mediastinal masses.  No mediastinal or hilar adenopathy. Trachea and esophagus are unremarkable. Lungs/Pleura: Linear/reticular opacities extend from the left upper lobe mass to the anterior, anterolateral and anteromedial pleural margins. There are no other lung masses. No nodules. Linear opacities are noted in the right lower lobe consistent with atelectasis. Mild centrilobular emphysema. No evidence of pneumonia or pulmonary edema. No pleural effusion or pneumothorax. Upper Abdomen: Poorly defined mass in the right lobe of the liver better seen on the previous day's study. Distention of the visualized bowel prominently with gas. No acute findings. Musculoskeletal: Degenerative changes of the visualized lower cervical spine. No fracture or acute finding. No osteoblastic or osteolytic lesions. IMPRESSION: 1.  Mass extends from the AP window of the mediastinum into the left upper lobe consistent with neoplastic disease, which could be primary or metastatic. 2. There are no other  lung masses or nodules to support metastatic disease. 3. No acute findings. 4. Incidental finding a 1.6 cm thyroid nodule on the left. Depending on the outcome of the patient's other findings on this chest CT and prior abdomen and pelvis CT, further evaluation with thyroid ultrasound. If patient is clinically hyperthyroid, consider nuclear medicine thyroid uptake and scan. 5. Mild centrilobular emphysema.  Mild aortic atherosclerosis. Aortic Atherosclerosis (ICD10-I70.0) and Emphysema (ICD10-J43.9). Electronically Signed   By: Lajean Manes M.D.   On: 09/20/2019 13:55   Dg Abd Portable 1v  Result Date: 09/21/2019 CLINICAL DATA:  NG tube placement EXAM: PORTABLE ABDOMEN - 1 VIEW COMPARISON:  09/19/2019 FINDINGS: The enteric tube projects over the expected region of the gastric body. The tube is kinked at the side-hole. Again noted is extensive gaseous distention of loops of small bowel and colon scattered throughout the abdomen. There is no definite pneumatosis or free air, however evaluation for both is limited by the diffuse gaseous distension. The urinary bladder is significantly distended. IMPRESSION: 1. Enteric tube as above. 2. Persistent gaseous distension of small bowel and colon. 3. Distended urinary bladder. Electronically Signed   By: Constance Holster M.D.   On: 09/21/2019 13:20    Anti-infectives: Anti-infectives (From admission, onward)   Start     Dose/Rate Route Frequency Ordered Stop   09/20/19 0800  aztreonam (AZACTAM) 1 g in sodium chloride 0.9 % 100 mL IVPB     1 g 200 mL/hr over 30 Minutes Intravenous Every 8 hours 09/19/19 2246     09/20/19 0700  metroNIDAZOLE (FLAGYL) IVPB 500 mg     500 mg 100 mL/hr over 60 Minutes Intravenous Every 8 hours 09/19/19 2310     09/19/19 2245  aztreonam (AZACTAM) 2 g in sodium  chloride 0.9 % 100 mL IVPB     2 g 200 mL/hr over 30 Minutes Intravenous  Once 09/19/19 2236 09/19/19 2332   09/19/19 2245  metroNIDAZOLE (FLAGYL) IVPB 500 mg     500 mg 100 mL/hr over 60 Minutes Intravenous  Once 09/19/19 2236 09/20/19 0110      Assessment/Plan: Problem List: Patient Active Problem List   Diagnosis Date Noted  . Abdominal distension   . Liver mass 09/19/2019  . Colonic mass 09/19/2019  . Mass of colon 09/19/2019  . Malignant neoplasm of prostate ([facility]) 08/15/2013    WBC elevated.  Will repeat abdominal xray to assess distention of colon. --question of free air on plain films--radiology recommending CT and this is ordered.    * No surgery found *    LOS: 3 days   Matt B. Hassell Done, MD, [facility] [phone] beeper [phone]  09/22/2019 8:37 AM

## 2019-09-23 ENCOUNTER — Inpatient Hospital Stay (HOSPITAL_COMMUNITY): Payer: Medicare Other | Admitting: Anesthesiology

## 2019-09-23 ENCOUNTER — Encounter (HOSPITAL_COMMUNITY): Payer: Self-pay

## 2019-09-23 ENCOUNTER — Encounter (HOSPITAL_COMMUNITY)
Admission: EM | Disposition: A | Discharge status: Home / Self Care | Payer: Self-pay | Source: Home / Self Care | Attending: Internal Medicine

## 2019-09-23 ENCOUNTER — Inpatient Hospital Stay (HOSPITAL_COMMUNITY): Payer: Medicare Other

## 2019-09-23 HISTORY — PX: LAPAROTOMY: SHX154

## 2019-09-23 HISTORY — PX: COLOSTOMY: SHX63

## 2019-09-23 LAB — CBC WITH DIFFERENTIAL/PLATELET
Abs Immature Granulocytes: 0.08 10*3/uL — ABNORMAL HIGH (ref 0.00–0.07)
Basophils Absolute: 0 10*3/uL (ref 0.0–0.1)
Basophils Relative: 0 %
Eosinophils Absolute: 0 10*3/uL (ref 0.0–0.5)
Eosinophils Relative: 0 %
HCT: 32.6 % — ABNORMAL LOW (ref 39.0–52.0)
Hemoglobin: 10.9 g/dL — ABNORMAL LOW (ref 13.0–17.0)
Immature Granulocytes: 1 %
Lymphocytes Relative: 6 %
Lymphs Abs: 0.8 10*3/uL (ref 0.7–4.0)
MCH: 31.5 pg (ref 26.0–34.0)
MCHC: 33.4 g/dL (ref 30.0–36.0)
MCV: 94.2 fL (ref 80.0–100.0)
Monocytes Absolute: 0.7 10*3/uL (ref 0.1–1.0)
Monocytes Relative: 5 %
Neutro Abs: 12.3 10*3/uL — ABNORMAL HIGH (ref 1.7–7.7)
Neutrophils Relative %: 88 %
Platelets: 385 10*3/uL (ref 150–400)
RBC: 3.46 MIL/uL — ABNORMAL LOW (ref 4.22–5.81)
RDW: 13.8 % (ref 11.5–15.5)
WBC: 13.9 10*3/uL — ABNORMAL HIGH (ref 4.0–10.5)
nRBC: 0 % (ref 0.0–0.2)

## 2019-09-23 LAB — BASIC METABOLIC PANEL
Anion gap: 11 (ref 5–15)
BUN: 31 mg/dL — ABNORMAL HIGH (ref 8–23)
CO2: 21 mmol/L — ABNORMAL LOW (ref 22–32)
Calcium: 8.8 mg/dL — ABNORMAL LOW (ref 8.9–10.3)
Chloride: 115 mmol/L — ABNORMAL HIGH (ref 98–111)
Creatinine, Ser: 1.33 mg/dL — ABNORMAL HIGH (ref 0.61–1.24)
GFR calc Af Amer: 58 mL/min — ABNORMAL LOW (ref >60)
GFR calc non Af Amer: 50 mL/min — ABNORMAL LOW (ref >60)
Glucose, Bld: 93 mg/dL (ref 70–99)
Potassium: 3.3 mmol/L — ABNORMAL LOW (ref 3.5–5.1)
Sodium: 147 mmol/L — ABNORMAL HIGH (ref 135–145)

## 2019-09-23 LAB — TYPE AND SCREEN
ABO/RH(D): A POS
Antibody Screen: NEGATIVE

## 2019-09-23 LAB — ABO/RH: ABO/RH(D): A POS

## 2019-09-23 SURGERY — LAPAROTOMY, EXPLORATORY
Anesthesia: General | Site: Abdomen

## 2019-09-23 MED ORDER — PROPOFOL 10 MG/ML IV BOLUS
INTRAVENOUS | Status: DC | PRN
  Administered 2019-09-23: 100 mg via INTRAVENOUS

## 2019-09-23 MED ORDER — FENTANYL CITRATE (PF) 250 MCG/5ML IJ SOLN
INTRAMUSCULAR | Status: DC | PRN
  Administered 2019-09-23: 100 ug via INTRAVENOUS

## 2019-09-23 MED ORDER — FENTANYL CITRATE (PF) 250 MCG/5ML IJ SOLN
INTRAMUSCULAR | Status: AC
  Filled 2019-09-23: qty 5

## 2019-09-23 MED ORDER — ONDANSETRON HCL 4 MG/2ML IJ SOLN
INTRAMUSCULAR | Status: DC | PRN
  Administered 2019-09-23: 4 mg via INTRAVENOUS

## 2019-09-23 MED ORDER — ROCURONIUM BROMIDE 50 MG/5ML IV SOSY
PREFILLED_SYRINGE | INTRAVENOUS | Status: DC | PRN
  Administered 2019-09-23: 40 mg via INTRAVENOUS

## 2019-09-23 MED ORDER — LIDOCAINE IN D5W 4-5 MG/ML-% IV SOLN
1.0000 mg/min | INTRAVENOUS | Status: DC
  Filled 2019-09-23: qty 500

## 2019-09-23 MED ORDER — LIDOCAINE IN D5W 4-5 MG/ML-% IV SOLN
INTRAVENOUS | Status: DC | PRN
  Administered 2019-09-23: 25 ug/kg/min via INTRAVENOUS

## 2019-09-23 MED ORDER — KETAMINE HCL 10 MG/ML IJ SOLN
INTRAMUSCULAR | Status: DC | PRN
  Administered 2019-09-23: 35 mg via INTRAVENOUS

## 2019-09-23 MED ORDER — PROMETHAZINE HCL 25 MG/ML IJ SOLN
6.2500 mg | INTRAMUSCULAR | Status: DC | PRN
  Administered 2019-09-23: 11:00:00 6.25 mg via INTRAVENOUS

## 2019-09-23 MED ORDER — MORPHINE SULFATE (PF) 2 MG/ML IV SOLN
1.0000 mg | INTRAVENOUS | Status: DC | PRN
  Administered 2019-09-23 – 2019-10-03 (×5): 1 mg via INTRAVENOUS
  Filled 2019-09-23 (×10): qty 1

## 2019-09-23 MED ORDER — PHENYLEPHRINE HCL (PRESSORS) 10 MG/ML IV SOLN
INTRAVENOUS | Status: DC | PRN
  Administered 2019-09-23 (×2): 40 ug via INTRAVENOUS

## 2019-09-23 MED ORDER — KETAMINE HCL 50 MG/5ML IJ SOSY
PREFILLED_SYRINGE | INTRAMUSCULAR | Status: AC
  Filled 2019-09-23: qty 5

## 2019-09-23 MED ORDER — LIDOCAINE 2% (20 MG/ML) 5 ML SYRINGE
INTRAMUSCULAR | Status: DC | PRN
  Administered 2019-09-23: 60 mg via INTRAVENOUS

## 2019-09-23 MED ORDER — SODIUM CHLORIDE 0.9 % IV SOLN
INTRAVENOUS | Status: DC | PRN
  Administered 2019-09-23: 25 ug/min via INTRAVENOUS

## 2019-09-23 MED ORDER — 0.9 % SODIUM CHLORIDE (POUR BTL) OPTIME
TOPICAL | Status: DC | PRN
  Administered 2019-09-23: 2000 mL

## 2019-09-23 MED ORDER — CHLORHEXIDINE GLUCONATE CLOTH 2 % EX PADS
6.0000 | MEDICATED_PAD | Freq: Every day | CUTANEOUS | Status: DC
  Administered 2019-09-24 – 2019-10-05 (×10): 6 via TOPICAL

## 2019-09-23 MED ORDER — PROMETHAZINE HCL 25 MG/ML IJ SOLN
INTRAMUSCULAR | Status: AC
  Filled 2019-09-23: qty 1

## 2019-09-23 MED ORDER — FENTANYL CITRATE (PF) 100 MCG/2ML IJ SOLN
25.0000 ug | INTRAMUSCULAR | Status: DC | PRN
  Administered 2019-09-23 (×3): 25 ug via INTRAVENOUS

## 2019-09-23 MED ORDER — EPHEDRINE SULFATE 50 MG/ML IJ SOLN
INTRAMUSCULAR | Status: DC | PRN
  Administered 2019-09-23: 10 mg via INTRAVENOUS
  Administered 2019-09-23: 5 mg via INTRAVENOUS

## 2019-09-23 MED ORDER — SUGAMMADEX SODIUM 200 MG/2ML IV SOLN
INTRAVENOUS | Status: DC | PRN
  Administered 2019-09-23: 140.6 mg via INTRAVENOUS

## 2019-09-23 MED ORDER — LACTATED RINGERS IV SOLN
INTRAVENOUS | Status: DC
  Administered 2019-09-23: 09:00:00 via INTRAVENOUS

## 2019-09-23 MED ORDER — PROPOFOL 10 MG/ML IV BOLUS
INTRAVENOUS | Status: AC
  Filled 2019-09-23: qty 40

## 2019-09-23 MED ORDER — FENTANYL CITRATE (PF) 100 MCG/2ML IJ SOLN
INTRAMUSCULAR | Status: AC
  Filled 2019-09-23: qty 2

## 2019-09-23 MED ORDER — SUCCINYLCHOLINE CHLORIDE 20 MG/ML IJ SOLN
INTRAMUSCULAR | Status: DC | PRN
  Administered 2019-09-23: 80 mg via INTRAVENOUS

## 2019-09-23 SURGICAL SUPPLY — 83 items
APL PRP STRL LF DISP 70% ISPRP (MISCELLANEOUS) ×3
APPLIER CLIP ROT 10 11.4 M/L (STAPLE)
APR CLP MED LRG 11.4X10 (STAPLE)
BLADE CLIPPER SURG (BLADE) IMPLANT
BLADE SURG 10 STRL SS (BLADE) ×5 IMPLANT
BNDG GAUZE ELAST 4 BULKY (GAUZE/BANDAGES/DRESSINGS) ×2 IMPLANT
CANISTER SUCT 3000ML PPV (MISCELLANEOUS) ×5 IMPLANT
CELLS DAT CNTRL 66122 CELL SVR (MISCELLANEOUS) IMPLANT
CHLORAPREP W/TINT 26 (MISCELLANEOUS) ×5 IMPLANT
CLIP APPLIE ROT 10 11.4 M/L (STAPLE) IMPLANT
COVER MAYO STAND STRL (DRAPES) ×3 IMPLANT
COVER SURGICAL LIGHT HANDLE (MISCELLANEOUS) ×8 IMPLANT
COVER WAND RF STERILE (DRAPES) ×3 IMPLANT
DRAPE HALF SHEET 40X57 (DRAPES) IMPLANT
DRAPE LAPAROSCOPIC ABDOMINAL (DRAPES) ×5 IMPLANT
DRAPE UTILITY XL STRL (DRAPES) ×3 IMPLANT
DRAPE WARM FLUID 44X44 (DRAPES) ×5 IMPLANT
DRSG OPSITE POSTOP 4X10 (GAUZE/BANDAGES/DRESSINGS) IMPLANT
DRSG OPSITE POSTOP 4X8 (GAUZE/BANDAGES/DRESSINGS) IMPLANT
ELECT BLADE 6.5 EXT (BLADE) ×5 IMPLANT
ELECT CAUTERY BLADE 6.4 (BLADE) ×8 IMPLANT
ELECT REM PT RETURN 9FT ADLT (ELECTROSURGICAL) ×5
ELECTRODE REM PT RTRN 9FT ADLT (ELECTROSURGICAL) ×3 IMPLANT
GEL ULTRASOUND 20GR AQUASONIC (MISCELLANEOUS) IMPLANT
GLOVE BIOGEL PI IND STRL 7.0 (GLOVE) IMPLANT
GLOVE BIOGEL PI INDICATOR 7.0 (GLOVE) ×2
GLOVE SURG SIGNA 7.5 PF LTX (GLOVE) ×10 IMPLANT
GLOVE SURG SS PI 7.0 STRL IVOR (GLOVE) ×2 IMPLANT
GOWN STRL REUS W/ TWL LRG LVL3 (GOWN DISPOSABLE) ×18 IMPLANT
GOWN STRL REUS W/ TWL XL LVL3 (GOWN DISPOSABLE) ×6 IMPLANT
GOWN STRL REUS W/TWL LRG LVL3 (GOWN DISPOSABLE) ×10
GOWN STRL REUS W/TWL XL LVL3 (GOWN DISPOSABLE) ×5
HANDLE SUCTION POOLE (INSTRUMENTS) ×3 IMPLANT
KIT BASIN OR (CUSTOM PROCEDURE TRAY) ×5 IMPLANT
KIT OSTOMY DRAINABLE 2.75 STR (WOUND CARE) ×2 IMPLANT
KIT TURNOVER KIT B (KITS) ×5 IMPLANT
LEGGING LITHOTOMY PAIR STRL (DRAPES) IMPLANT
LIGASURE IMPACT 36 18CM CVD LR (INSTRUMENTS) ×2 IMPLANT
NS IRRIG 1000ML POUR BTL (IV SOLUTION) ×10 IMPLANT
OSTOMY BRIDGE 2 1/2 (MISCELLANEOUS) ×2 IMPLANT
PACK GENERAL/GYN (CUSTOM PROCEDURE TRAY) ×5 IMPLANT
PAD ABD 8X10 STRL (GAUZE/BANDAGES/DRESSINGS) ×2 IMPLANT
PAD ARMBOARD 7.5X6 YLW CONV (MISCELLANEOUS) ×8 IMPLANT
PENCIL BUTTON HOLSTER BLD 10FT (ELECTRODE) ×6 IMPLANT
PENCIL SMOKE EVACUATOR (MISCELLANEOUS) ×5 IMPLANT
RETRACTOR WND ALEXIS 18 MED (MISCELLANEOUS) IMPLANT
RTRCTR WOUND ALEXIS 18CM MED (MISCELLANEOUS)
SCISSORS LAP 5X35 DISP (ENDOMECHANICALS) ×3 IMPLANT
SET IRRIG TUBING LAPAROSCOPIC (IRRIGATION / IRRIGATOR) IMPLANT
SHEARS HARMONIC ACE PLUS 36CM (ENDOMECHANICALS) ×3 IMPLANT
SLEEVE ENDOPATH XCEL 5M (ENDOMECHANICALS) ×3 IMPLANT
SPECIMEN JAR LARGE (MISCELLANEOUS) ×5 IMPLANT
SPONGE LAP 18X18 RF (DISPOSABLE) IMPLANT
STAPLER VISISTAT 35W (STAPLE) ×5 IMPLANT
SUCTION POOLE HANDLE (INSTRUMENTS) ×5
SURGILUBE 2OZ TUBE FLIPTOP (MISCELLANEOUS) IMPLANT
SUT PDS AB 1 TP1 96 (SUTURE) ×10 IMPLANT
SUT PROLENE 2 0 CT2 30 (SUTURE) IMPLANT
SUT PROLENE 2 0 KS (SUTURE) IMPLANT
SUT SILK 2 0 SH CR/8 (SUTURE) ×5 IMPLANT
SUT SILK 2 0 TIES 10X30 (SUTURE) ×5 IMPLANT
SUT SILK 3 0 SH CR/8 (SUTURE) ×5 IMPLANT
SUT SILK 3 0 TIES 10X30 (SUTURE) ×5 IMPLANT
SUT VIC AB 3-0 SH 18 (SUTURE) IMPLANT
SYR BULB IRRIGATION 50ML (SYRINGE) ×5 IMPLANT
SYS LAPSCP GELPORT 120MM (MISCELLANEOUS)
SYSTEM LAPSCP GELPORT 120MM (MISCELLANEOUS) IMPLANT
TAPE CLOTH SURG 6X10 WHT LF (GAUZE/BANDAGES/DRESSINGS) ×2 IMPLANT
TOWEL GREEN STERILE (TOWEL DISPOSABLE) ×8 IMPLANT
TOWEL GREEN STERILE FF (TOWEL DISPOSABLE) ×5 IMPLANT
TRAY FOLEY MTR SLVR 16FR STAT (SET/KITS/TRAYS/PACK) ×5 IMPLANT
TRAY LAPAROSCOPIC MC (CUSTOM PROCEDURE TRAY) ×5 IMPLANT
TRAY PROCTOSCOPIC FIBER OPTIC (SET/KITS/TRAYS/PACK) IMPLANT
TROCAR XCEL 12X100 BLDLESS (ENDOMECHANICALS) IMPLANT
TROCAR XCEL BLUNT TIP 100MML (ENDOMECHANICALS) IMPLANT
TROCAR XCEL NON-BLD 11X100MML (ENDOMECHANICALS) IMPLANT
TROCAR XCEL NON-BLD 5MMX100MML (ENDOMECHANICALS) ×3 IMPLANT
TUBE CONNECTING 12'X1/4 (SUCTIONS) ×2
TUBE CONNECTING 12X1/4 (SUCTIONS) ×8 IMPLANT
TUBING EVAC SMOKE HEATED PNEUM (TUBING) ×3 IMPLANT
UNDERPAD 30X30 (UNDERPADS AND DIAPERS) ×5 IMPLANT
WATER STERILE IRR 1000ML POUR (IV SOLUTION) ×3 IMPLANT
YANKAUER SUCT BULB TIP NO VENT (SUCTIONS) ×6 IMPLANT

## 2019-09-23 NOTE — Anesthesia Postprocedure Evaluation (Signed)
Anesthesia Post Note  Patient: Charles Espinoza  Procedure(s) Performed: EXPLORATORY LAPAROTOMY (N/A Abdomen) DIVERTING LOOP DESCENDING COLOSTOMY (Abdomen)     Patient location during evaluation: PACU Anesthesia Type: General Level of consciousness: awake and alert Pain management: pain level controlled Vital Signs Assessment: post-procedure vital signs reviewed and stable Respiratory status: spontaneous breathing, nonlabored ventilation, respiratory function stable and patient connected to nasal cannula oxygen Cardiovascular status: blood pressure returned to baseline and stable Postop Assessment: no apparent nausea or vomiting Anesthetic complications: no    Last Vitals:  Vitals:   09/23/19 1212 09/23/19 1245  BP: 115/86 116/80  Pulse: 89 87  Resp: (!) 23 (!) 22  Temp: 36.4 C 36.4 C  SpO2: 99% 100%    Last Pain:  Vitals:   09/23/19 1602  TempSrc:   PainSc: 8                  Tiajuana Amass

## 2019-09-23 NOTE — Transfer of Care (Signed)
Immediate Anesthesia Transfer of Care Note  Patient: Charles Espinoza  Procedure(s) Performed: EXPLORATORY LAPAROTOMY (N/A Abdomen) DIVERTING LOOP DESCENDING COLOSTOMY (Abdomen)  Patient Location: PACU  Anesthesia Type:General  Level of Consciousness: awake, patient cooperative and responds to stimulation  Airway & Oxygen Therapy: Patient Spontanous Breathing and Patient connected to face mask oxygen  Post-op Assessment: Report given to RN, Post -op Vital signs reviewed and stable and Patient moving all extremities X 4  Post vital signs: Reviewed and stable  Last Vitals:  Vitals Value Taken Time  BP 122/75 09/23/19 1043  Temp 36.5 C 09/23/19 1043  Pulse 76 09/23/19 1046  Resp 32 09/23/19 1046  SpO2 94 % 09/23/19 1046  Vitals shown include unvalidated device data.  Last Pain:  Vitals:   09/23/19 0850  TempSrc:   PainSc: 0-No pain      Patients Stated Pain Goal: 2 ([id number])  Complications: No apparent anesthesia complications

## 2019-09-23 NOTE — Progress Notes (Signed)
PROGRESS NOTE  Charles Espinoza [id number] DOB: Feb 04, 1941 DOA: 09/19/2019 PCP: Rogers Blocker, MD  HPI/Recap of past 24 hours: HPI from Dr Dutch Gray Scolaro is a 78 y.o. male with medical history significant of prostate CA s/p radiation in Jan 2015, COPD presents to the ED with c/o generalized abd pain for the past 1 month, worsening over the past 1 week with abd distention. Patient also c/o loose watery stool and still passing gas. In ED, CT shows no volvulus, but does show a large 7.5x4.5 complex sigmoid collection possibly abscess vs malignant ulceration as well as 7.5x7.0 cm hepatic lesion. Pt with WBC 16k. Pt started on aztreonam and flagyl.  Gen surg consulted and asked medicine to admit.    Today, saw patient after surgery, reported post op pain, but overall looked somewhat comfortable. 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, diverting colostomy on 09/23/19 Afebrile, with down trending leukocytosis CT abd/pelvis shows large mass with possible likely contained perforation CEA 10.6 CT chest shows mass extends from the mediastinum into the left upper lobe consistent with neoplastic disease Repeat CT abd/pelvis on 10/4 showed perforated and obstructing rectal colon carcinoma, perirectal abscess, distention of urinary bladder General surgery on board, s/p ex lap, unable to resect mass or get liver biopsy due to the posterior location of the mass on the liver, only colostomy was done GI consulted, no further recommendation, signed off Continue IV aztreonam, Flagyl Pain management IV fluids, n.p.o.  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 Primary hepatic neoplasm is felt less likely Spoke to Dr Ninfa Linden, will need CT guided liver biopsy by IR once pt is decompressed and more stable GI consulted, signed off  Hypokalemia Replace PRN  AKI Last creatinine was 1.2 in 2014 Likely due to above Continue IV fluids Daily BMP  Anemia of chronic disease Likely due to above Daily CBC  History of prostate CA Status post completion of radiation in 2015 PSA 0.53        Malnutrition Type:      Malnutrition Characteristics:      Nutrition Interventions:       Estimated body mass index is 23.57 kg/m as calculated from the following:   Height as of this encounter: 5\' 8"  (1.727 m).   Weight as of this encounter: 70.3 kg.     Code Status: Full  Family Communication: None at bedside  Disposition Plan: To be determined   Consultants:  General surgery  GI  Procedures:  Surgery as above on 09/23/19  Antimicrobials:  Aztreonam  Flagyl  DVT prophylaxis: Lovenox   Objective: Vitals:   09/23/19 1142 09/23/19 1157 09/23/19 1212 09/23/19 1245  BP: 116/83 107/79 115/86 116/80  Pulse: 86 89 89 87  Resp: (!) 27 (!) 25 (!) 23 (!) 22  Temp:   97.6 F (36.4 C) 97.6 F (36.4 C)  TempSrc:    Oral  SpO2: 94% 98% 99% 100%  Weight:      Height:        Intake/Output Summary (Last 24 hours) at 09/23/2019 1556  Last data filed at 09/23/2019 1500 Gross per 24 hour  Intake 5675.36 ml  Output 3300 ml  Net 2375.36 ml   Filed Weights   09/19/19 2200 09/23/19 0850  Weight: 70.3 kg 70.3 kg    Exam:  General: NAD   Cardiovascular: S1, S2 present  Respiratory: CTAB  Abdomen: Soft, + generalized tenderness, ++distended, dressing noted C/D/I,  hypoactive bowel sounds, colostomy present  Musculoskeletal: No bilateral pedal edema noted  Skin: Normal  Psychiatry: Normal mood   Data  Reviewed: CBC: Recent Labs  Lab 09/19/19 1658 09/20/19 0430 09/21/19 0317 09/22/19 0411 09/23/19 0410  WBC 16.1* 14.9* 15.0* 17.0* 13.9*  NEUTROABS  --   --  13.0* 15.3* 12.3*  HGB 12.4* 11.9* 11.5* 11.0* 10.9*  HCT 37.8* 35.9* 34.8* 32.9* 32.6*  MCV 95.0 93.0 93.3 92.9 94.2  PLT 372 350 354 385 0000000   Basic Metabolic Panel: Recent Labs  Lab 09/19/19 1658 09/20/19 0430 09/21/19 0317 09/22/19 0411 09/23/19 0410  NA 138 138 137 145 147*  K 3.8 3.6 3.2* 3.4* 3.3*  CL 100 100 103 109 115*  CO2 23 26 22  21* 21*  GLUCOSE 108* 106* 100* 107* 93  BUN 19 19 20  32* 31*  CREATININE 1.28* 1.28* 1.11 1.58* 1.33*  CALCIUM 8.8* 8.6* 8.2* 8.5* 8.8*   GFR: Estimated Creatinine Clearance: 42.9 mL/min (A) (by C-G formula based on SCr of 1.33 mg/dL (H)). Liver Function Tests: Recent Labs  Lab 09/19/19 1658  AST 31  ALT 24  ALKPHOS 108  BILITOT 0.9  PROT 7.2  ALBUMIN 2.8*   Recent Labs  Lab 09/19/19 1658  LIPASE 21   No results for input(s): AMMONIA in the last 168 hours. Coagulation Profile: No results for input(s): INR, PROTIME in the last 168 hours. Cardiac Enzymes: No results for input(s): CKTOTAL, CKMB, CKMBINDEX, TROPONINI in the last 168 hours. BNP (last 3 results) No results for input(s): PROBNP in the last 8760 hours. HbA1C: No results for input(s): HGBA1C in the last 72 hours. CBG: No results for input(s): GLUCAP in the last 168 hours. Lipid Profile: No results for input(s): CHOL, HDL, LDLCALC, TRIG, CHOLHDL, LDLDIRECT in the last 72 hours. Thyroid Function Tests: No results for input(s): TSH, T4TOTAL, FREET4, T3FREE, THYROIDAB in the last 72 hours. Anemia Panel: No results for input(s): VITAMINB12, FOLATE, FERRITIN, TIBC, IRON, RETICCTPCT in the last 72 hours. Urine analysis:    Component Value Date/Time   COLORURINE AMBER (A) 09/19/2019 1920   APPEARANCEUR HAZY (A) 09/19/2019 1920   LABSPEC 1.021 09/19/2019 1920   PHURINE 5.0 09/19/2019 1920   GLUCOSEU  NEGATIVE 09/19/2019 1920   HGBUR MODERATE (A) 09/19/2019 1920   BILIRUBINUR NEGATIVE 09/19/2019 1920   KETONESUR NEGATIVE 09/19/2019 1920   PROTEINUR 100 (A) 09/19/2019 1920   NITRITE NEGATIVE 09/19/2019 1920   LEUKOCYTESUR NEGATIVE 09/19/2019 1920   Sepsis Labs: @LABRCNTIP (procalcitonin:4,lacticidven:4)  ) Recent Results (from the past 240 hour(s))  SARS Coronavirus 2 [facility] order, Performed in [facility] hospital lab) Nasopharyngeal Nasopharyngeal Swab     Status: None   Collection Time: 09/19/19  9:43 PM   Specimen: Nasopharyngeal Swab  Result Value Ref Range Status   SARS Coronavirus 2 NEGATIVE NEGATIVE Final    Comment: (NOTE) If result is NEGATIVE SARS-CoV-2 target nucleic acids are NOT DETECTED. The SARS-CoV-2 RNA is generally detectable in upper and lower  respiratory specimens during the acute phase of infection. The lowest  concentration of SARS-CoV-2 viral copies this assay can detect is 250  copies / mL. A negative result does not preclude SARS-CoV-2 infection  and should not be used as the sole basis for treatment or other  patient management decisions.  A negative result may occur with  improper specimen collection / handling, submission of specimen other  than nasopharyngeal swab, presence of viral mutation(s) within the  areas targeted by this assay, and inadequate number of viral copies  (<250 copies / mL). A negative result must be combined with clinical  observations, patient history, and epidemiological information. If result is POSITIVE SARS-CoV-2 target nucleic acids are DETECTED. The SARS-CoV-2 RNA is generally detectable in upper and lower  respiratory specimens dur ing the acute phase of infection.  Positive  results are indicative of active infection with SARS-CoV-2.  Clinical  correlation with patient history and other diagnostic information is  necessary to determine patient infection status.  Positive results do  not rule out bacterial  infection or co-infection with other viruses. If result is PRESUMPTIVE POSTIVE SARS-CoV-2 nucleic acids MAY BE PRESENT.   A presumptive positive result was obtained on the submitted specimen  and confirmed on repeat testing.  While 2019 novel coronavirus  (SARS-CoV-2) nucleic acids may be present in the submitted sample  additional confirmatory testing may be necessary for epidemiological  and / or clinical management purposes  to differentiate between  SARS-CoV-2 and other Sarbecovirus currently known to infect humans.  If clinically indicated additional testing with an alternate test  methodology [phone]) is advised. The SARS-CoV-2 RNA is generally  detectable in upper and lower respiratory sp ecimens during the acute  phase of infection. The expected result is Negative. Fact Sheet for Patients:  StrictlyIdeas.no Fact Sheet for Healthcare Providers: BankingDealers.co.za This test is not yet approved or cleared by the [place] FDA and has been authorized for detection and/or diagnosis of SARS-CoV-2 by FDA under an Emergency Use Authorization (EUA).  This EUA will remain in effect (meaning this test can be used) for the duration of the COVID-19 declaration under Section 564(b)(1) of the Act, 21 U.S.C. section 360bbb-3(b)(1), unless the authorization is terminated or revoked sooner. Performed at [facility], [place] [address] [place], [place] [address]   MRSA PCR Screening     Status: None   Collection Time: 09/20/19  1:25 AM   Specimen: Nasopharyngeal  Result Value Ref Range Status   MRSA by PCR NEGATIVE NEGATIVE Final    Comment:        The GeneXpert MRSA Assay (FDA approved for NASAL specimens only), is one component of a comprehensive MRSA colonization surveillance program. It is not intended to diagnose MRSA infection nor to guide or monitor treatment for MRSA infections. Performed at [facility], [place] [address], [place], [place] [address]       Studies: Dg Abd 1 View  Result Date: 09/23/2019 CLINICAL DATA:  Nasogastric tube advancement. Recent exploratory laparotomy with colostomy. EXAM: ABDOMEN - 1 VIEW COMPARISON:  One view abdomen 09/23/2019 and 09/21/2019. FINDINGS: 1309 hours. The enteric tube has been advanced with the tip at the level of the gastric fundus and the side hole near the GE junction. Mild diffuse bowel distention appears unchanged. There is mild atelectasis at both lung bases. IMPRESSION: The enteric tube has been advanced, tip now overlying the gastric fundus. Electronically Signed   By: Richardean Sale M.D.   On: 09/23/2019 13:34   X-ray Abdomen Ap  Result Date: 09/23/2019 CLINICAL DATA:  Reason for exam: S/P abdominal surgery, follow up  exam RN informs there is questionable displacement of NG tube. S/P EXPLORATORY LAPAROTOMY EXAM: ABDOMEN - 1 VIEW COMPARISON:  Abdominal radiograph 09/21/2019 FINDINGS: The nasogastric tube side port tip projects over the mid esophagus. Diffuse gaseous distention of bowel loops similar to prior exams. Lung bases are clear. No acute finding in the visualized skeleton. IMPRESSION: Nasogastric tube tip projects over the mid esophagus and could be advanced approximately 13 cm to reach the stomach. Electronically Signed   By: Audie Pinto M.D.   On: 09/23/2019 12:22    Scheduled Meds:  Chlorhexidine Gluconate Cloth  6 each Topical Daily   enoxaparin (LOVENOX) injection  40 mg Subcutaneous Q24H   fentaNYL       promethazine        Continuous Infusions:  sodium chloride Stopped (09/23/19 0821)   aztreonam 1 g (09/22/19 2241)   lactated ringers 10 mL/hr at 09/23/19 0905   metronidazole 100 mL/hr at 09/23/19 1500     LOS: 4 days     Alma Friendly, MD [facility]  If 7PM-7AM, please contact night-coverage [email or web address] 09/23/2019, 3:56 PM

## 2019-09-23 NOTE — Anesthesia Preprocedure Evaluation (Signed)
Anesthesia Evaluation  Patient identified by MRN, date of birth, ID band Patient awake    Reviewed: Allergy & Precautions, NPO status , Patient's Chart, lab work & pertinent test results  Airway Mallampati: II  TM Distance: >3 FB     Dental  (+) Dental Advisory Given   Pulmonary COPD, Current Smoker,    breath sounds clear to auscultation       Cardiovascular negative cardio ROS   Rhythm:Regular Rate:Normal     Neuro/Psych negative neurological ROS     GI/Hepatic ?liver met Rectal CA   Endo/Other  negative endocrine ROS  Renal/GU Renal InsufficiencyRenal disease     Musculoskeletal   Abdominal   Peds  Hematology negative hematology ROS (+)   Anesthesia Other Findings   Reproductive/Obstetrics                             Lab Results  Component Value Date   WBC 13.9 (H) 09/23/2019   HGB 10.9 (L) 09/23/2019   HCT 32.6 (L) 09/23/2019   MCV 94.2 09/23/2019   PLT 385 09/23/2019   Lab Results  Component Value Date   CREATININE 1.33 (H) 09/23/2019   BUN 31 (H) 09/23/2019   NA 147 (H) 09/23/2019   K 3.3 (L) 09/23/2019   CL 115 (H) 09/23/2019   CO2 21 (L) 09/23/2019    Anesthesia Physical Anesthesia Plan  ASA: III  Anesthesia Plan: General   Post-op Pain Management:    Induction: Intravenous  PONV Risk Score and Plan: 1 and Dexamethasone, Ondansetron and Treatment may vary due to age or medical condition  Airway Management Planned: Oral ETT  Additional Equipment:   Intra-op Plan:   Post-operative Plan: Extubation in OR and Possible Post-op intubation/ventilation  Informed Consent: I have reviewed the patients History and Physical, chart, labs and discussed the procedure including the risks, benefits and alternatives for the proposed anesthesia with the patient or authorized representative who has indicated his/her understanding and acceptance.     Dental advisory  given  Plan Discussed with: CRNA  Anesthesia Plan Comments:         Anesthesia Quick Evaluation

## 2019-09-23 NOTE — Anesthesia Procedure Notes (Signed)
Procedure Name: Intubation Date/Time: 09/23/2019 9:25 AM Performed by: Glynda Jaeger, CRNA Pre-anesthesia Checklist: Patient identified, Patient being monitored, Timeout performed, Emergency Drugs available and Suction available Patient Re-evaluated:Patient Re-evaluated prior to induction Oxygen Delivery Method: Circle System Utilized Preoxygenation: Pre-oxygenation with 100% oxygen Induction Type: IV induction Laryngoscope Size: Mac and 4 Grade View: Grade I Tube type: Oral Tube size: 7.5 mm Number of attempts: 1 Airway Equipment and Method: Stylet Placement Confirmation: ETT inserted through vocal cords under direct vision,  positive ETCO2 and breath sounds checked- equal and bilateral Secured at: 22 cm Tube secured with: Tape Dental Injury: Teeth and Oropharynx as per pre-operative assessment  Comments: RSI

## 2019-09-23 NOTE — Consult Note (Signed)
[place] Nurse ostomy follow up  Patient receiving care in [place].  Just returned from PACU from the ostomy surgery and has a leaking pouch Stoma type/location: LUQ colostomy Stomal assessment/size: stoma is 2.5 inches, has a rigid, sutured support rod, stoma is grossly edematous. These factors render the use of a 2 3/4 inch pouching system unfeasible. Peristomal assessment: intact Treatment options for stomal/peristomal skin: barrier ring if needed Output: drops of serosanginous Ostomy pouching: 2pc., 4 inch fecal ostomy kit Education provided: none at present Enrolled patient in [employer] Discharge program: No Thank you for the consult.  Val Riles, RN, MSN, CWOCN, CNS-BC, pager [phone]

## 2019-09-23 NOTE — Plan of Care (Signed)
  Problem: Education: Goal: Knowledge of General Education information will improve Description Including pain rating scale, medication(s)/side effects and non-pharmacologic comfort measures Outcome: Progressing   

## 2019-09-23 NOTE — Op Note (Signed)
Charles Espinoza 09/23/2019   Pre-op Diagnosis: perforated rectal cancer     Post-op Diagnosis: same  Procedure(s): EXPLORATORY LAPAROTOMY DIVERTING LOOP DESCENDING COLOSTOMY  Surgeon(s): Coralie Keens, MD  Anesthesia: General  Staff:  Circulator: Cyd Silence, RN Scrub Person: Rolan Bucco  Estimated Blood Loss: Minimal               Indications: This is an 78 year old gentleman who presented with a large bowel obstruction with distention of both large and small bowel.  There was a large lesion in his right lobe of the liver consistent with metastatic disease.  His CT findings were also consistent with an obstructing rectal carcinoma with possible perforation.  Because of worsening findings on CT scan and obstruction, the decision was made to proceed to the operating room.  Findings: The patient was found to have an obstructing rectal cancer that appeared to be fixated in the pelvis and growing into the bladder.  It was not felt to be resectable.  There was a large mass in the right lobe of the liver but it was posterior and not amenable to biopsy.  He had diffusely distended small and large bowel but no other gross evidence of metastatic disease or implants that could be biopsied.  Procedure: The patient was brought to the operating room and identifies the correct patient.  He was placed prone on the operating room table and general anesthesia was induced.  His abdomen was then prepped and draped in the usual sterile fashion.  I created a midline incision with a scalpel.  I then took this down through the peritoneum and fascia the entire length of the incision.  The small bowel was immediately eviscerated and found to be extensively distended.  He also had very distended colon through the entire length.  There was some free fluid in the abdomen but no gross purulence.  I ran the small bowel from the ligament of Treitz to the terminal ileum and saw no evidence of metastatic  implants.  He had a large mass in the rectum that was fixated in the pelvis and fixated to the bladder.  It was not amenable to resection.  I evaluated the right lobe of the liver.  The large mass was posterior and I could not reach it well enough to perform a safe biopsy.  At this point, the decision was made to proceed with a loop descending colostomy.  I mobilized the left colon along the white line of Toldt.  I then made a circular incision the patient's left lower quadrant with the cautery.  I took this down to the fascia which was then opened in a cruciate fashion including the peritoneum.  I then pulled out a loop of descending colon through this as a loop colostomy.  I then placed a 69 [place] Blake drain under direct vision and through a skin incision in the right lower quadrant and into the pelvis.  This was secured in place with a nylon suture.  The drain was placed to bulb suction.  I closed the midline fascia with a running #1 looped PDS suture.  I then opened of the mesentery of the loop and placed a bridge underneath it.  I then opened up the colon with the cautery and matured it circumferentially with 3-0 Vicryl sutures.  I sutured the bridge in place with silk sutures.  An ostomy appliance was applied.  Wet-to-dry dressings were placed on the open midline incision.  The patient appeared to  tolerate the procedure.  All sponge needle and instrument counts were correct at the end of the procedure.  The patient was then extubated in the operating room and taken in a stable condition to the recovery room.          Coralie Keens   Date: 09/23/2019  Time: 10:24 AM

## 2019-09-23 NOTE — Progress Notes (Signed)
Patient ID: Charles Espinoza, male   DOB: 1941-03-10, 78 y.o.   MRN: [id number]  Preop:  I have reviewed the patient's CAT scan.  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  He continues to have persistent obstruction.  I have discussed this with the patient and his wife.  We discussed operative management.  This would necessitate an exploratory laparotomy with a diverting colostomy and possible resection as well as possible liver biopsy.  I discussed the risk which includes but is not limited to bleeding, infection, injury to surrounding structures, cardiopulmonary issues, the need for prolonged ventilation, and even death.  We discussed nonoperative management with palliative care and the eventual grim prognosis. They understand and wish to proceed with surgery which will be scheduled for today.

## 2019-09-24 ENCOUNTER — Encounter (HOSPITAL_COMMUNITY): Payer: Self-pay | Admitting: Surgery

## 2019-09-24 LAB — BASIC METABOLIC PANEL
Anion gap: 13 (ref 5–15)
BUN: 26 mg/dL — ABNORMAL HIGH (ref 8–23)
CO2: 23 mmol/L (ref 22–32)
Calcium: 8.1 mg/dL — ABNORMAL LOW (ref 8.9–10.3)
Chloride: 115 mmol/L — ABNORMAL HIGH (ref 98–111)
Creatinine, Ser: 1.23 mg/dL (ref 0.61–1.24)
GFR calc Af Amer: >60 mL/min (ref >60)
GFR calc non Af Amer: 55 mL/min — ABNORMAL LOW (ref >60)
Glucose, Bld: 115 mg/dL — ABNORMAL HIGH (ref 70–99)
Potassium: 3.7 mmol/L (ref 3.5–5.1)
Sodium: 151 mmol/L — ABNORMAL HIGH (ref 135–145)

## 2019-09-24 LAB — CBC WITH DIFFERENTIAL/PLATELET
Abs Immature Granulocytes: 0.08 10*3/uL — ABNORMAL HIGH (ref 0.00–0.07)
Basophils Absolute: 0 10*3/uL (ref 0.0–0.1)
Basophils Relative: 0 %
Eosinophils Absolute: 0 10*3/uL (ref 0.0–0.5)
Eosinophils Relative: 0 %
HCT: 40 % (ref 39.0–52.0)
Hemoglobin: 12.7 g/dL — ABNORMAL LOW (ref 13.0–17.0)
Immature Granulocytes: 1 %
Lymphocytes Relative: 5 %
Lymphs Abs: 0.7 10*3/uL (ref 0.7–4.0)
MCH: 30.4 pg (ref 26.0–34.0)
MCHC: 31.8 g/dL (ref 30.0–36.0)
MCV: 95.7 fL (ref 80.0–100.0)
Monocytes Absolute: 0.4 10*3/uL (ref 0.1–1.0)
Monocytes Relative: 3 %
Neutro Abs: 11.5 10*3/uL — ABNORMAL HIGH (ref 1.7–7.7)
Neutrophils Relative %: 91 %
Platelets: 466 10*3/uL — ABNORMAL HIGH (ref 150–400)
RBC: 4.18 MIL/uL — ABNORMAL LOW (ref 4.22–5.81)
RDW: 13.9 % (ref 11.5–15.5)
WBC: 12.7 10*3/uL — ABNORMAL HIGH (ref 4.0–10.5)
nRBC: 0 % (ref 0.0–0.2)

## 2019-09-24 MED ORDER — DEXTROSE-NACL 5-0.45 % IV SOLN
INTRAVENOUS | Status: AC
  Administered 2019-09-24 – 2019-09-25 (×3): via INTRAVENOUS

## 2019-09-24 MED ORDER — WHITE PETROLATUM EX OINT
TOPICAL_OINTMENT | CUTANEOUS | Status: AC
  Administered 2019-09-24: 1
  Filled 2019-09-24: qty 28.35

## 2019-09-24 NOTE — Progress Notes (Signed)
NGt clamped for trials as instructed by PA

## 2019-09-24 NOTE — Progress Notes (Signed)
No c/o nausea or discomfort voiced since clamping NGT this morning

## 2019-09-24 NOTE — Progress Notes (Signed)
PROGRESS NOTE  Yameen Broberg [id number] DOB: [birth year] DOA: 09/19/2019 PCP: Rogers Blocker, MD  HPI/Recap of past 24 hours: HPI from Dr Dutch Gray Lantis is a 78 y.o. male with medical history significant of prostate CA s/p radiation in Jan 2015, COPD presents to the ED with c/o generalized abd pain for the past 1 month, worsening over the past 1 week with abd distention. Patient also c/o loose watery stool and still passing gas. In ED, CT shows no volvulus, but does show a large 7.5x4.5 complex sigmoid collection possibly abscess vs malignant ulceration as well as 7.5x7.0 cm hepatic lesion. Pt with WBC 16k. Pt started on aztreonam and flagyl.  Gen surg consulted and asked medicine to admit.     Today, patient denies any new complaints.  Reports mild postop pain, controlled with pain meds.  Unsure if he is passed gas, no bowel movements.  Wants NG tube out.    Assessment/Plan: Principal Problem:   Colonic mass Active Problems:   Malignant neoplasm of prostate (HCC)   Liver mass   Mass of colon   Abdominal distension  Large bowel obstruction 2/2 perforated rectal colon cancer s/p ex lap, diverting colostomy on 09/23/19 Afebrile, with down trending leukocytosis CT abd/pelvis shows large mass with possible likely contained perforation CEA 10.6 CT chest shows mass extends from the mediastinum into the left upper lobe consistent with neoplastic disease Repeat CT abd/pelvis on 10/4 showed perforated and obstructing rectal colon carcinoma, perirectal abscess, distention of urinary bladder General surgery on board, s/p ex lap, unable to resect mass or get liver biopsy due to the posterior location of the mass on the liver, only colostomy was done GI consulted, no further recommendation, signed off Continue IV aztreonam, Flagyl Pain management IV fluids, n.p.o.  Liver lesion, likely mets Hepatitis panel unremarkable AFP tumor marker 2.8 MRI showed large right hepatic mass lesion,  findings favors metastatic lesion from obstructing colon cancer leading to contained perforation adjacent to the sigmoid colon. Primary hepatic neoplasm is felt less likely Spoke to Dr Ninfa Linden, will need CT guided liver biopsy by IR once pt is decompressed and more stable GI consulted, signed off  Hypernatremia Changed IV fluids to D5W/1/2NS Daily BMP  Hypokalemia Replace PRN  AKI Improved Last creatinine was 1.2 in 2014 Likely due to above Continue IV fluids Daily BMP  Anemia of chronic disease Likely due to above Daily CBC  History of prostate CA Status post completion of radiation in 2015 PSA 0.53        Malnutrition Type:      Malnutrition Characteristics:      Nutrition Interventions:       Estimated body mass index is 23.57 kg/m as calculated from the following:   Height as of this encounter: 5\' 8"  (1.727 m).   Weight as of this encounter: 70.3 kg.     Code Status: Full  Family Communication: None at bedside  Disposition Plan: To be determined   Consultants:  General surgery  GI  Procedures:  Surgery as above on 09/23/19  Antimicrobials:  Aztreonam  Flagyl  DVT prophylaxis: Lovenox   Objective: Vitals:   09/23/19 2116 09/24/19 0103 09/24/19 0412 09/24/19 0900  BP: 117/85 (!) 129/92 (!) 136/92 125/86  Pulse: 98 (!) 101 100 93  Resp: (!) 22 18 18    Temp: 97.7 F (36.5 C) 98 F (36.7 C) 98 F (36.7 C) 97.8 F (36.6 C)  TempSrc: Oral Oral Oral Oral  SpO2: 99% 99% 99% 99%  Weight:      Height:        Intake/Output Summary (Last 24 hours) at 09/24/2019 1457 Last data filed at 09/24/2019 0900 Gross per 24 hour  Intake 1770.74 ml  Output 2050 ml  Net -279.26 ml   Filed Weights   09/19/19 2200 09/23/19 0850  Weight: 70.3 kg 70.3 kg    Exam:  General: NAD   Cardiovascular: S1, S2 present  Respiratory: CTAB  Abdomen: Soft, + generalized tenderness, ++distended, hypoactive bowel sounds, colostomy present with  sanguineous fluid  Musculoskeletal: No bilateral pedal edema noted  Skin: Normal  Psychiatry: Normal mood   Data Reviewed: CBC: Recent Labs  Lab 09/20/19 0430 09/21/19 0317 09/22/19 0411 09/23/19 0410 09/24/19 0431  WBC 14.9* 15.0* 17.0* 13.9* 12.7*  NEUTROABS  --  13.0* 15.3* 12.3* 11.5*  HGB 11.9* 11.5* 11.0* 10.9* 12.7*  HCT 35.9* 34.8* 32.9* 32.6* 40.0  MCV 93.0 93.3 92.9 94.2 95.7  PLT 350 354 385 385 [id number]*   Basic Metabolic Panel: Recent Labs  Lab 09/20/19 0430 09/21/19 0317 09/22/19 0411 09/23/19 0410 09/24/19 0431  NA 138 137 145 147* 151*  K 3.6 3.2* 3.4* 3.3* 3.7  CL 100 103 109 115* 115*  CO2 26 22 21* 21* 23  GLUCOSE 106* 100* 107* 93 115*  BUN 19 20 32* 31* 26*  CREATININE 1.28* 1.11 1.58* 1.33* 1.23  CALCIUM 8.6* 8.2* 8.5* 8.8* 8.1*   GFR: Estimated Creatinine Clearance: 46.3 mL/min (by C-G formula based on SCr of 1.23 mg/dL). Liver Function Tests: Recent Labs  Lab 09/19/19 1658  AST 31  ALT 24  ALKPHOS 108  BILITOT 0.9  PROT 7.2  ALBUMIN 2.8*   Recent Labs  Lab 09/19/19 1658  LIPASE 21   No results for input(s): AMMONIA in the last 168 hours. Coagulation Profile: No results for input(s): INR, PROTIME in the last 168 hours. Cardiac Enzymes: No results for input(s): CKTOTAL, CKMB, CKMBINDEX, TROPONINI in the last 168 hours. BNP (last 3 results) No results for input(s): PROBNP in the last 8760 hours. HbA1C: No results for input(s): HGBA1C in the last 72 hours. CBG: No results for input(s): GLUCAP in the last 168 hours. Lipid Profile: No results for input(s): CHOL, HDL, LDLCALC, TRIG, CHOLHDL, LDLDIRECT in the last 72 hours. Thyroid Function Tests: No results for input(s): TSH, T4TOTAL, FREET4, T3FREE, THYROIDAB in the last 72 hours. Anemia Panel: No results for input(s): VITAMINB12, FOLATE, FERRITIN, TIBC, IRON, RETICCTPCT in the last 72 hours. Urine analysis:    Component Value Date/Time   COLORURINE AMBER (A) 09/19/2019 1920    APPEARANCEUR HAZY (A) 09/19/2019 1920   LABSPEC 1.021 09/19/2019 1920   PHURINE 5.0 09/19/2019 1920   GLUCOSEU NEGATIVE 09/19/2019 1920   HGBUR MODERATE (A) 09/19/2019 1920   BILIRUBINUR NEGATIVE 09/19/2019 1920   KETONESUR NEGATIVE 09/19/2019 1920   PROTEINUR 100 (A) 09/19/2019 1920   NITRITE NEGATIVE 09/19/2019 1920   LEUKOCYTESUR NEGATIVE 09/19/2019 1920   Sepsis Labs: @LABRCNTIP (procalcitonin:4,lacticidven:4)  ) Recent Results (from the past 240 hour(s))  SARS Coronavirus 2 [facility] order, Performed in [facility] hospital lab) Nasopharyngeal Nasopharyngeal Swab     Status: None   Collection Time: 09/19/19  9:43 PM   Specimen: Nasopharyngeal Swab  Result Value Ref Range Status   SARS Coronavirus 2 NEGATIVE NEGATIVE Final    Comment: (NOTE) If result is NEGATIVE SARS-CoV-2 target nucleic acids are NOT DETECTED. The SARS-CoV-2 RNA is generally detectable in upper and lower  respiratory specimens during the acute phase  of infection. The lowest  concentration of SARS-CoV-2 viral copies this assay can detect is 250  copies / mL. A negative result does not preclude SARS-CoV-2 infection  and should not be used as the sole basis for treatment or other  patient management decisions.  A negative result may occur with  improper specimen collection / handling, submission of specimen other  than nasopharyngeal swab, presence of viral mutation(s) within the  areas targeted by this assay, and inadequate number of viral copies  (<250 copies / mL). A negative result must be combined with clinical  observations, patient history, and epidemiological information. If result is POSITIVE SARS-CoV-2 target nucleic acids are DETECTED. The SARS-CoV-2 RNA is generally detectable in upper and lower  respiratory specimens dur ing the acute phase of infection.  Positive  results are indicative of active infection with SARS-CoV-2.  Clinical  correlation with patient history and other diagnostic  information is  necessary to determine patient infection status.  Positive results do  not rule out bacterial infection or co-infection with other viruses. If result is PRESUMPTIVE POSTIVE SARS-CoV-2 nucleic acids MAY BE PRESENT.   A presumptive positive result was obtained on the submitted specimen  and confirmed on repeat testing.  While 2019 novel coronavirus  (SARS-CoV-2) nucleic acids may be present in the submitted sample  additional confirmatory testing may be necessary for epidemiological  and / or clinical management purposes  to differentiate between  SARS-CoV-2 and other Sarbecovirus currently known to infect humans.  If clinically indicated additional testing with an alternate test  methodology [id number]) is advised. The SARS-CoV-2 RNA is generally  detectable in upper and lower respiratory sp ecimens during the acute  phase of infection. The expected result is Negative. Fact Sheet for Patients:  StrictlyIdeas.no Fact Sheet for Healthcare Providers: BankingDealers.co.za This test is not yet approved or cleared by the [place] FDA and has been authorized for detection and/or diagnosis of SARS-CoV-2 by FDA under an Emergency Use Authorization (EUA).  This EUA will remain in effect (meaning this test can be used) for the duration of the COVID-19 declaration under Section 564(b)(1) of the Act, 21 U.S.C. section 360bbb-3(b)(1), unless the authorization is terminated or revoked sooner. Performed at [facility], [place] [address] [place], [place] [address]   MRSA PCR Screening     Status: None   Collection Time: 09/20/19  1:25 AM   Specimen: Nasopharyngeal  Result Value Ref Range Status   MRSA by PCR NEGATIVE NEGATIVE Final    Comment:        The GeneXpert MRSA Assay (FDA approved for NASAL specimens only), is one component of a comprehensive MRSA colonization surveillance program. It is not intended to diagnose MRSA  infection nor to guide or monitor treatment for MRSA infections. Performed at [facility], [place] [address], [place], [place] [address]       Studies: No results found.  Scheduled Meds: . Chlorhexidine Gluconate Cloth  6 each Topical Daily  . enoxaparin (LOVENOX) injection  40 mg Subcutaneous Q24H    Continuous Infusions: . aztreonam 1 g (09/24/19 0844)  . dextrose 5 % and 0.45% NaCl 75 mL/hr at 09/24/19 0851  . metronidazole 500 mg (09/24/19 0601)     LOS: 5 days     Alma Friendly, MD [facility]  If 7PM-7AM, please contact night-coverage [email or web address] 09/24/2019, 2:57 PM

## 2019-09-24 NOTE — Care Management Important Message (Signed)
Important Message  Patient Details  Name: Charles Espinoza MRN: [id number] Date of Birth: 05/11/1941   Medicare Important Message Given:  Yes     Shelda Altes 09/24/2019, 4:53 PM

## 2019-09-24 NOTE — Progress Notes (Addendum)
1 Day Post-Op   Subjective/Chief Complaint: Pt states he has no abdominal pain and no other complaints. He does not know if he has passed flatus or had a BM, and is not sure of his urination status either due to foley. He states he has ambulated some with no complications. Pt denies nausea and vomiting. No chest pain or palpitations. Pt is NPO, does not feel hungry, but wants water.   Objective: Vital signs in last 24 hours: Temp:  [97.6 F (36.4 C)-98.7 F (37.1 C)] 98 F (36.7 C) (10/06 0412) Pulse Rate:  [86-101] 100 (10/06 0412) Resp:  [18-27] 18 (10/06 0412) BP: (107-136)/(71-92) 136/92 (10/06 0412) SpO2:  [94 %-100 %] 99 % (10/06 0412) Weight:  [70.3 kg] 70.3 kg (10/05 0850) Last BM Date: 09/21/19  Intake/Output from previous day: 10/05 0701 - 10/06 0700 In: 2520.7 [P.O.:60; I.V.:1860.8; IV Piggyback:599.9] Out: 2160 [Urine:1400; Emesis/NG output:550; Drains:150; Stool:50; Blood:10] Intake/Output this shift: No intake/output data recorded.  General: no acute distress, able to follow commands CV: regular rate and rhythm  Pulm: Clear to auscultation in all fields.  Abdominal: abdomen distended, with normoactive BS. Non tender to palpation in all quadrants. Ostomy site looks clean and noninfected, bag had a little bloody fluid in it and copious amount of gas, and was full of air. NG tube canister was full.  Neuro: pt able to follow commands, neurovascularly intact   Lab Results:  Recent Labs    09/23/19 0410 09/24/19 0431  WBC 13.9* 12.7*  HGB 10.9* 12.7*  HCT 32.6* 40.0  PLT 385 466*   BMET Recent Labs    09/23/19 0410 09/24/19 0431  NA 147* 151*  K 3.3* 3.7  CL 115* 115*  CO2 21* 23  GLUCOSE 93 115*  BUN 31* 26*  CREATININE 1.33* 1.23  CALCIUM 8.8* 8.1*   PT/INR No results for input(s): LABPROT, INR in the last 72 hours. ABG No results for input(s): PHART, HCO3 in the last 72 hours.  Invalid input(s): PCO2, PO2  Studies/Results: Ct Abdomen Pelvis Wo  Contrast  Result Date: 09/22/2019 CLINICAL DATA:  78 year old male with free air on prior plain film EXAM: CT ABDOMEN AND PELVIS WITHOUT CONTRAST TECHNIQUE: Multidetector CT imaging of the abdomen and pelvis was performed following the standard protocol without IV contrast. COMPARISON:  Plain film 09/22/2019, CT 09/19/2019 FINDINGS: Lower chest: Atelectasis at the lung bases with respiratory motion somewhat limiting the lung evaluation. Gastric tube terminates at the GE junction. Hepatobiliary: The previous identified right liver mass is incompletely characterized on the current CT. Unremarkable gallbladder. No radiopaque cholelithiasis. Pancreas: Unremarkable Spleen: Unremarkable Adrenals/Urinary Tract: Unremarkable adrenal glands. Fullness in the bilateral collecting system with distention of the urinary. Bilateral cysts of the kidneys, likely Bosniak 1 cysts. Stomach/Bowel: Stomach is decompressed. Similar degree mild distention of small bowel loops. Similar degree of colonic distention with moderate to large stool burden. The irregular appearance of the distal rectum, potentially a rectal carcinoma and better characterized on prior CT, is similar in appearance. There is evidence of ongoing abscess in the perirectal region, with fluid and gas collection measuring transversely 7.9 cm. Evidence of free air in the left and right subdiaphragmatic region corresponding to findings on prior plain film. Vascular/Lymphatic: Mild atherosclerotic changes of the aorta and iliac arteries. Reproductive: Prostate measures 5.2 cm transverse diameter with impression on the bladder base. Other: Body wall edema and mesenteric edema/anasarca. Musculoskeletal: . no acute displaced fracture. Degenerative changes of the spine. IMPRESSION: CT demonstrates free air in  the bilateral subdiaphragmatic region, compatible with hollow viscus perforation. 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 Persisting evidence of colonic obstruction, with moderate to large stool burden. Metastatic disease to the liver again demonstrated. Perirectal abscess measuring 7.9 cm at the site of the presumed rectal cancer. These results were discussed by telephone at the time of interpretation on 09/22/2019 at 3:14 pm with Dr. Georganna Skeans of surgery. Distention of urinary bladder, which is the presumed etiology of mild bilateral dilation of the collecting system. Body wall/mesenteric edema/anasarca. Additional ancillary findings as above. Electronically Signed   By: Corrie Mckusick D.O.   On: 09/22/2019 15:17   Dg Abd 1 View  Result Date: 09/23/2019 CLINICAL DATA:  Nasogastric tube advancement. Recent exploratory laparotomy with colostomy. EXAM: ABDOMEN - 1 VIEW COMPARISON:  One view abdomen 09/23/2019 and 09/21/2019. FINDINGS: 1309 hours. The enteric tube has been advanced with the tip at the level of the gastric fundus and the side hole near the GE junction. Mild diffuse bowel distention appears unchanged. There is mild atelectasis at both lung bases. IMPRESSION: The enteric tube has been advanced, tip now overlying the gastric fundus. Electronically Signed   By: Richardean Sale M.D.   On: 09/23/2019 13:34   X-ray Abdomen Ap  Result Date: 09/23/2019 CLINICAL DATA:  Reason for exam: S/P abdominal surgery, follow up exam RN informs there is questionable displacement of NG tube. S/P EXPLORATORY LAPAROTOMY EXAM: ABDOMEN - 1 VIEW COMPARISON:  Abdominal radiograph 09/21/2019 FINDINGS: The nasogastric tube side port tip projects over the mid esophagus. Diffuse gaseous distention of bowel loops similar to prior exams. Lung bases are clear. No acute finding in the visualized skeleton. IMPRESSION: Nasogastric tube tip projects over the mid esophagus and could be advanced approximately 13  cm to reach the stomach. Electronically Signed   By: Audie Pinto M.D.   On: 09/23/2019 12:22   Dg Abd 2 Views  Addendum Date: 09/22/2019   ADDENDUM REPORT: 09/22/2019 12:09 ADDENDUM: Above findings discussed with Dr. Johnathan Hausen at 11:34 a.m. Electronically Signed   By: Marin Olp M.D.   On: 09/22/2019 12:09   Result Date: 09/22/2019 CLINICAL DATA:  Colonic obstruction. EXAM: ABDOMEN - 2 VIEW COMPARISON:  09/21/2019 FINDINGS: Nasogastric tube has been pulled back as tip is located in the expected region of the distal esophagus just above the gastroesophageal junction. This could be advanced 8-10 cm. Continued air-filled loops of large and small bowel with moderate fecal retention over the right colon and distal descending colon. Collection of air under the right hemidiaphragm on the upright film which may represent free peritoneal air. No definite dilated small bowel loops. Persistent mildly distended bladder. Remainder the exam is unchanged. IMPRESSION: Persistent multiple air-filled loops of large and small bowel with moderate fecal retention over the right and left colon. No definite dilated small bowel loops. Possible free air under the right hemidiaphragm. Right lateral decubitus film versus CT could confirm this finding. Nasogastric tube with tip over the region of the distal esophagus just above the gastroesophageal junction. This could be advanced 8-10 cm. Electronically Signed: By: Marin Olp M.D. On: 09/22/2019 11:25    Anti-infectives: Anti-infectives (From admission, onward)   Start     Dose/Rate Route Frequency Ordered Stop   09/20/19 0800  aztreonam (AZACTAM) 1 g in sodium chloride 0.9 %  100 mL IVPB     1 g 200 mL/hr over 30 Minutes Intravenous Every 8 hours 09/19/19 2246     09/20/19 0700  metroNIDAZOLE (FLAGYL) IVPB 500 mg     500 mg 100 mL/hr over 60 Minutes Intravenous Every 8 hours 09/19/19 2310     09/19/19 2245  aztreonam (AZACTAM) 2 g in sodium chloride 0.9 %  100 mL IVPB     2 g 200 mL/hr over 30 Minutes Intravenous  Once 09/19/19 2236 09/19/19 2332   09/19/19 2245  metroNIDAZOLE (FLAGYL) IVPB 500 mg     500 mg 100 mL/hr over 60 Minutes Intravenous  Once 09/19/19 2236 09/20/19 0110      Assessment/Plan: Large bowel obstruction due to possible colorectal cancer  POD 1 Diverting loop descending colostomy - Dr. Ninfa Linden 10/4 - continue to check ostomy and wounds for signs of infection  - changed dressing, no signs of infection  - NG tube in place, will clamp and give sips of fluids  - copious gas in ostomy  -ID: Flagyl 10/5>>, WBC 12.7, continue to monitor CBC -FEN: NPO except ice chips.  -DVT: Lovenox   Liver mass - unable to biopsy intraoperatively  - CT guided liver biopsy by IR once pt decompressed   Hx of prostate Cancer  - radiation completed in 2015 - PSA 0.53  Anemia of chronic disease  - daily CBC  AKI - IVF and daily BMP   LOS: 5 days    Renato Gails Sherleen Pangborn 09/24/2019

## 2019-09-24 NOTE — Plan of Care (Signed)
  Problem: Pain Managment: Goal: General experience of comfort will improve Outcome: Progressing   Problem: Safety: Goal: Ability to remain free from injury will improve Outcome: Progressing   Problem: Skin Integrity: Goal: Risk for impaired skin integrity will decrease Outcome: Progressing   

## 2019-09-25 ENCOUNTER — Inpatient Hospital Stay: Payer: Self-pay

## 2019-09-25 ENCOUNTER — Inpatient Hospital Stay (HOSPITAL_COMMUNITY): Payer: Medicare Other

## 2019-09-25 DIAGNOSIS — K56609 Unspecified intestinal obstruction, unspecified as to partial versus complete obstruction: Secondary | ICD-10-CM

## 2019-09-25 LAB — CBC WITH DIFFERENTIAL/PLATELET
Abs Immature Granulocytes: 0.09 10*3/uL — ABNORMAL HIGH (ref 0.00–0.07)
Basophils Absolute: 0 10*3/uL (ref 0.0–0.1)
Basophils Relative: 0 %
Eosinophils Absolute: 0 10*3/uL (ref 0.0–0.5)
Eosinophils Relative: 0 %
HCT: 39.5 % (ref 39.0–52.0)
Hemoglobin: 12.9 g/dL — ABNORMAL LOW (ref 13.0–17.0)
Immature Granulocytes: 1 %
Lymphocytes Relative: 7 %
Lymphs Abs: 0.8 10*3/uL (ref 0.7–4.0)
MCH: 31.2 pg (ref 26.0–34.0)
MCHC: 32.7 g/dL (ref 30.0–36.0)
MCV: 95.6 fL (ref 80.0–100.0)
Monocytes Absolute: 0.5 10*3/uL (ref 0.1–1.0)
Monocytes Relative: 4 %
Neutro Abs: 10.6 10*3/uL — ABNORMAL HIGH (ref 1.7–7.7)
Neutrophils Relative %: 88 %
Platelets: 413 10*3/uL — ABNORMAL HIGH (ref 150–400)
RBC: 4.13 MIL/uL — ABNORMAL LOW (ref 4.22–5.81)
RDW: 13.9 % (ref 11.5–15.5)
WBC: 12 10*3/uL — ABNORMAL HIGH (ref 4.0–10.5)
nRBC: 0 % (ref 0.0–0.2)

## 2019-09-25 LAB — BASIC METABOLIC PANEL
Anion gap: 9 (ref 5–15)
BUN: 24 mg/dL — ABNORMAL HIGH (ref 8–23)
CO2: 23 mmol/L (ref 22–32)
Calcium: 8 mg/dL — ABNORMAL LOW (ref 8.9–10.3)
Chloride: 118 mmol/L — ABNORMAL HIGH (ref 98–111)
Creatinine, Ser: 0.92 mg/dL (ref 0.61–1.24)
GFR calc Af Amer: >60 mL/min (ref >60)
GFR calc non Af Amer: >60 mL/min (ref >60)
Glucose, Bld: 145 mg/dL — ABNORMAL HIGH (ref 70–99)
Potassium: 3.9 mmol/L (ref 3.5–5.1)
Sodium: 150 mmol/L — ABNORMAL HIGH (ref 135–145)

## 2019-09-25 LAB — GLUCOSE, CAPILLARY
Glucose-Capillary: 123 mg/dL — ABNORMAL HIGH (ref 70–99)
Glucose-Capillary: 153 mg/dL — ABNORMAL HIGH (ref 70–99)

## 2019-09-25 MED ORDER — INSULIN ASPART 100 UNIT/ML ~~LOC~~ SOLN
0.0000 [IU] | SUBCUTANEOUS | Status: DC
  Administered 2019-09-25: 21:00:00 2 [IU] via SUBCUTANEOUS
  Administered 2019-09-26 – 2019-09-27 (×8): 1 [IU] via SUBCUTANEOUS
  Administered 2019-09-27 (×2): 2 [IU] via SUBCUTANEOUS
  Administered 2019-09-28 (×6): 1 [IU] via SUBCUTANEOUS

## 2019-09-25 MED ORDER — SODIUM CHLORIDE 0.9% FLUSH: 10.0000 mL | INTRAVENOUS | Status: DC | PRN

## 2019-09-25 MED ORDER — TRAVASOL 10 % IV SOLN
INTRAVENOUS | Status: AC
  Administered 2019-09-25: 17:00:00 via INTRAVENOUS
  Filled 2019-09-25: qty 360

## 2019-09-25 MED ORDER — SODIUM CHLORIDE 0.9 % IV SOLN
2.0000 g | Freq: Three times a day (TID) | INTRAVENOUS | Status: DC
  Administered 2019-09-25 – 2019-09-27 (×7): 2 g via INTRAVENOUS
  Filled 2019-09-25 (×10): qty 2

## 2019-09-25 MED ORDER — DEXTROSE-NACL 5-0.45 % IV SOLN: INTRAVENOUS | Status: AC

## 2019-09-25 NOTE — Consult Note (Signed)
[facility] Nurse ostomy follow up Patient receiving care in [place]. Patient feeling poorly today. No teaching attempted. Stoma type/location: Very large, edematous, producing serosanginous and flatus. No evidence of washout to pouch I placed 2 days ago.  NGT remains to LWS. Pouch not changed. Stomal assessment/size:  Peristomal assessment:  Treatment options for stomal/peristomal skin:  Output  Ostomy pouching: 2pc. Flat, 4 inch kit. Two additional kits in room. Education provided: none Enrolled patient in [place] Start Discharge program: No Will see Friday. Perhaps patient will feel more like teaching session. Val Riles, RN, MSN, CWOCN, CNS-BC, pager [phone]

## 2019-09-25 NOTE — Progress Notes (Signed)
Pharmacy Antibiotic Note  Charles Espinoza is a 78 y.o. male admitted on 09/19/2019 with abdominal pain. Pharmacy has been consulted for Azactam dosing for intra-abdominal infection.  No cephalosporin use in the past. Metronidazole per TRH team. WBC  12 today, Scr up 0.92; CrCl 62, afebrile. Patient s/p colostomy for large bowel obstruction possibly due to colorectal cancer.  .  Plan: Increase Azactam to 2gm IV Q8H for improved renal function Continue Metronidazole 500mg  IV q8h Monitor renal fxn, clinical progress  Height: 5\' 8"  (172.7 cm) Weight: 155 lb (70.3 kg) IBW/kg (Calculated) : 68.4  Temp (24hrs), Avg:98 F (36.7 C), Min:98 F (36.7 C), Max:98.1 F (36.7 C)  Recent Labs  Lab 09/21/19 0317 09/22/19 0411 09/23/19 0410 09/24/19 0431 09/25/19 0203  WBC 15.0* 17.0* 13.9* 12.7* 12.0*  CREATININE 1.11 1.58* 1.33* 1.23 0.92    Estimated Creatinine Clearance: 62 mL/min (by C-G formula based on SCr of 0.92 mg/dL).    Allergies  Allergen Reactions  . Penicillins     Did it involve swelling of the face/tongue/throat, SOB, or low BP? n/a Did it involve sudden or severe rash/hives, skin peeling, or any reaction on the inside of your mouth or nose? n/a Did you need to seek medical attention at a hospital or doctor's office? n/a When did it last happen?[id number] If all above answers are "NO", may proceed with cephalosporin use.   Antibiotics Azactam 10/1 >> Metronidazole 10/2 >>  Microbiology 10/1 covid -  10/2 MRSA PCR negative  Charles Espinoza A. Levada Dy, PharmD, BCPS, FNKF Clinical Pharmacist [place] Please utilize Amion for appropriate phone number to reach the unit pharmacist ([place])

## 2019-09-25 NOTE — Progress Notes (Signed)
Peripherally Inserted Central Catheter/Midline Placement  The IV Nurse has discussed with the patient and/or persons authorized to consent for the patient, the purpose of this procedure and the potential benefits and risks involved with this procedure.  The benefits include less needle sticks, lab draws from the catheter, and the patient may be discharged home with the catheter. Risks include, but not limited to, infection, bleeding, blood clot (thrombus formation), and puncture of an artery; nerve damage and irregular heartbeat and possibility to perform a PICC exchange if needed/ordered by physician.  Alternatives to this procedure were also discussed.  B[facility] PICC patient education guide, fact sheet on infection prevention and patient information card has been provided to patient /or left at bedside.    PICC/Midline Placement Documentation  PICC Double Lumen 09/25/19 PICC Right Brachial 40 cm 0 cm (Active)  Indication for Insertion or Continuance of Line Administration of hyperosmolar/irritating solutions (i.e. TPN, Vancomycin, etc.) 09/25/19 1325  Exposed Catheter (cm) 0 cm 09/25/19 1325  Site Assessment Clean;Dry;Intact 09/25/19 1325  Lumen #1 Status Flushed;Blood return noted 09/25/19 1325  Lumen #2 Status Flushed;Blood return noted 09/25/19 1325  Dressing Type Transparent;Securing device 09/25/19 1325  Dressing Status Clean;Dry;Intact;Antimicrobial disc in place;Other (Comment) 09/25/19 1325  Dressing Intervention New dressing 09/25/19 1325  Dressing Change Due 10/02/19 09/25/19 1325       Charles Espinoza 09/25/2019, 1:26 PM

## 2019-09-25 NOTE — Progress Notes (Signed)
Initial Nutrition Assessment  RD working remotely.  DOCUMENTATION CODES:   Not applicable  INTERVENTION:   -TPN management per pharmacy -RD will follow for diet advancement and supplement as appropriate  NUTRITION DIAGNOSIS:   Inadequate oral intake related to altered GI function as evidenced by NPO status.  GOAL:   Patient will meet greater than or equal to 90% of their needs  MONITOR:   Diet advancement, Labs, Weight trends, Skin, I & O's  REASON FOR ASSESSMENT:   Consult New TPN/TNA  ASSESSMENT:   Charles Espinoza is a 78 y.o. male with medical history significant of prostate CA s/p radiation finished back in Jan 2015.Patient presents to the ED with c/o generalized abd pain for the past 1 month.  Worsening especially over the past 1 eek with abd distention.  Pain around umbilicus today.  Constant symptoms.  Loose watery stool with one episode earlier this AM.  Is passing gas still.  Pt admitted with colonic mass (abscess vs malignant ulceration).   10/2- MRI suggestive for colon mass (malignant ulceration) liver lesion worrisome for malignancy 10/3- NGT placed 10/4- per general surgery notes, likely perforated rectal cancer that has been leaking air and liver mass 10/5- CAT scan revealed likely rectal cancer with mets to liver and persistent obstruction; s/p Procedure(s): EXPLORATORY LAPAROTOMY; DIVERTING LOOP DESCENDING COLOSTOMY 10/6- NGT clamping trials started, per RN tolerated well 10/7 NGT resumed to low, intermittent suction  Reviewed I/O's: -208 ml x 24 hours and +3.2 L since admission  UOP: 600 ml x 24 hours NT output: 1.2 L x 24 hours  Drain output: 160 ml x 24 hours  Per general surgery notes, pt will require a liver biopsy when more stable.   Per H&P, pt reported a decreased appetite PTA and a 10# weight loss, however, this is not consistent with wt hx (which reveals wt stability over the past 5-6 years).   Pt has been NPO throughout hospitalization.  NGT currently connected to low, intermittent suction. Plan to PICC placement and TPN initiation today.   Per pharmacy notes, plan to initiate TPN at 30 ml/hr, which provides 800 kcals, and 36 grams protein, meeting 46% of estimated kcal and 42% of estimated protein needs.   Labs reviewed: Na: 150 no CBGS ordered (inpatient orders for glycemic control are 0-9 units insulin aspart every 4 hours).   Diet Order:   Diet Order            Diet NPO time specified Except for: Ice Chips, Sips with Meds  Diet effective now              EDUCATION NEEDS:   No education needs have been identified at this time  Skin:  Skin Assessment: Skin Integrity Issues: Skin Integrity Issues:: Incisions Incisions: closed abdomen  Last BM:  09/20/19  Height:   Ht Readings from Last 1 Encounters:  09/23/19 5\' 8"  (1.727 m)    Weight:   Wt Readings from Last 1 Encounters:  09/23/19 70.3 kg    Ideal Body Weight:  70 kg  BMI:  Body mass index is 23.57 kg/m.  Estimated Nutritional Needs:   Kcal:  [id number]  Protein:  85-100 grams  Fluid:  > 1.7 L    Zoe Creasman A. Jimmye Norman, RD, LDN, [place] Registered Dietitian II Certified Diabetes Care and Education Specialist Pager: [phone] After hours Pager: [phone]

## 2019-09-25 NOTE — Progress Notes (Signed)
[facility] NOTE   Pharmacy Consult for TPN Indication: prolonged ileus  Patient Measurements: Height: 5\' 8"  (172.7 cm) Weight: 155 lb (70.3 kg) IBW/kg (Calculated) : 68.4 TPN AdjBW (KG): 70.3 Body mass index is 23.57 kg/m.  Assessment:  78 year old male presented to the hospital with abdominal pain. Found to have a large bowel obstruction due to possible colorectal cancer vs diverticulitis. S/p ex lap with diverting loop descending colostomy on 10/5. Pt now with ileus and prolonged NPO status since 10/1 so will begin TPN while awaiting return of bowel function.  GI: NG clamped 10/6 but with emesis this AM so back to LIWS - NG output 1000cc Endo: No hx DM Insulin requirements in the past 24 hours: 0 Lytes: Na elevated at 150, K 3.9 (goal >/= 4), CoCa 9 Renal: [place] 0.92, UOP 0.4 Pulm: RA Cards: VSS Hepatobil: LFTs + Tbili WNL upon admission Neuro: A&Ox4, prn morphine, prn APAP ID: Aztreonam + flagyl for IAI - Afebrile, WBC 12  TPN Access: PICC to be placed 10/7 TPN start date: 10/7 Nutritional Goals (per RD recommendation on *pending*): KCal: 2000-2300  Protein: 90-120g Fluid: >/= 2L  Goal TPN rate is 85 ml/hr (provides 102g protein, 2268 KCal)  Current Nutrition:   Plan:  Start TPN at 55mL/hr - starting conservatively due to possible risk of refeeding syndrome This TPN provides 36 g of protein, 130 g of dextrose, and 22 g of lipids which provides 800 kCals per day, meeting 35% of patient needs Electrolytes in TPN: standard Add MVI, trace elements to TPN Start sensitive SSI and adjust as needed Reduce IVMF to 45 ml/hr when TPN starts Monitor TPN labs and signs of refeeding  Lamario Mani, Rande Lawman 09/25/2019,10:35 AM

## 2019-09-25 NOTE — Evaluation (Signed)
Physical Therapy Evaluation Patient Details Name: Charles Espinoza MRN: [id number] DOB: 08/10/1941 Today's Date: 09/25/2019   History of Present Illness  78yo male c/o general abdominal pain x1 month, abdominal distension, loose stools. CT shows sigmoid abscess vs malignant ulceration and hepatic lesion consistent with malignancy. Received exploratory laparotomy and diverting loop descending colostomy 09/23/19. PMH prostate CA with radiation treatments, COPD, HLD, prostate biopsy  Clinical Impression   Patient received in bed, initially reluctant to participate in therapy but able to be convinced; note this eval was limited as RN requested patient stay on NG suction, and tube length did not allow for walking in room. Able to perform bed mobility with min guard and assist to manage lines, functional transfers and sidestepping along side the bed with HHA of 1 and MinA for balance. Able to return to bed with min guard and assist for lines/lead management. He was left in bed with all needs met, bed alarm active, and family present. He will continue to benefit from skilled PT services in the acute setting, currently recommending skilled HHPT services moving forward.     Follow Up Recommendations Home health PT;Supervision for mobility/OOB    Equipment Recommendations  Rolling walker with 5" wheels;3in1 (PT)    Recommendations for Other Services       Precautions / Restrictions Precautions Precautions: Fall;Other (comment) Precaution Comments: JP drain, abdominal incision, NG tube, foley cath Restrictions Weight Bearing Restrictions: No      Mobility  Bed Mobility Overal bed mobility: Needs Assistance Bed Mobility: Supine to Sit;Sit to Supine     Supine to sit: Min guard Sit to supine: Min guard   General bed mobility comments: min guard for safety, assist for line/tube management  Transfers Overall transfer level: Needs assistance Equipment used: 1 person hand held assist Transfers:  Sit to/from Stand Sit to Stand: Min assist         General transfer comment: minA for steadying  Ambulation/Gait Ambulation/Gait assistance: Min assist   Assistive device: 1 person hand held assist   Gait velocity: decreased   General Gait Details: unable to ambulate in room due to RN requesting he stay on NG tube/NG tube short, able to take side steps along side of bed with MinA for balance  Stairs            Wheelchair Mobility    Modified Rankin (Stroke Patients Only)       Balance Overall balance assessment: Needs assistance Sitting-balance support: Bilateral upper extremity supported;No upper extremity supported Sitting balance-Leahy Scale: Good     Standing balance support: Bilateral upper extremity supported;During functional activity Standing balance-Leahy Scale: Fair Standing balance comment: reliant on HHA to maintain balance                             Pertinent Vitals/Pain Pain Assessment: No/denies pain    Home Living Family/patient expects to be discharged to:: Private residence Living Arrangements: Spouse/significant other Available Help at Discharge: Family;Available PRN/intermittently Type of Home: House Home Access: Level entry     Home Layout: One level Home Equipment: Shower seat - built in;None;Grab bars - tub/shower Additional Comments: wife has cane/walker, patient usually does not use these    Prior Function Level of Independence: Independent               Hand Dominance   Dominant Hand: Right    Extremity/Trunk Assessment   Upper Extremity Assessment Upper Extremity Assessment: Generalized weakness      Lower Extremity Assessment Lower Extremity Assessment: Generalized weakness    Cervical / Trunk Assessment Cervical / Trunk Assessment: Kyphotic  Communication   Communication: No difficulties  Cognition Arousal/Alertness: Awake/alert Behavior During Therapy: Anxious;WFL for tasks  assessed/performed Overall Cognitive Status: Within Functional Limits for tasks assessed                                 General Comments: may be a bit HOH, was a little prickly but followed commands and participated well      General Comments      Exercises     Assessment/Plan    PT Assessment Patient needs continued PT services  PT Problem List Decreased strength;Decreased knowledge of use of DME;Decreased activity tolerance;Decreased safety awareness;Decreased balance;Decreased knowledge of precautions;Decreased mobility;Decreased coordination       PT Treatment Interventions DME instruction;Balance training;Gait training;Neuromuscular re-education;Stair training;Functional mobility training;Patient/family education;Therapeutic activities;Therapeutic exercise    PT Goals (Current goals can be found in the Care Plan section)  Acute Rehab PT Goals Patient Stated Goal: feel better, get warm PT Goal Formulation: With patient Time For Goal Achievement: 10/09/19 Potential to Achieve Goals: Fair    Frequency Min 3X/week   Barriers to discharge        Co-evaluation               AM-PAC PT "6 Clicks" Mobility  Outcome Measure Help needed turning from your back to your side while in a flat bed without using bedrails?: A Little Help needed moving from lying on your back to sitting on the side of a flat bed without using bedrails?: A Little Help needed moving to and from a bed to a chair (including a wheelchair)?: A Little Help needed standing up from a chair using your arms (e.g., wheelchair or bedside chair)?: A Little Help needed to walk in hospital room?: A Little Help needed climbing 3-5 steps with a railing? : A Lot 6 Click Score: 17    End of Session   Activity Tolerance: Patient tolerated treatment well Patient left: in bed;with call bell/phone within reach;with bed alarm set;with family/visitor present   PT Visit Diagnosis: Unsteadiness on feet  (R26.81);Difficulty in walking, not elsewhere classified (R26.2);Muscle weakness (generalized) (M62.81)    Time: 8453-6468 PT Time Calculation (min) (ACUTE ONLY): 33 min   Charges:   PT Evaluation $PT Eval Moderate Complexity: 1 Mod PT Treatments $Therapeutic Activity: 8-22 mins        Deniece Ree PT, DPT, CBIS  Supplemental Physical Therapist [place]    Pager [phone] Acute Rehab Office [phone]

## 2019-09-25 NOTE — Progress Notes (Signed)
Pt had a small episode of emesis this am. Some distention and discomfort. NG tube resumed to LIS and pt is strict npo. Pt states he feels much better since suction was restarted. Will continue to monitor.

## 2019-09-25 NOTE — Progress Notes (Signed)
PROGRESS NOTE    Charles Espinoza  [id number] DOB: Feb 07, 1941 DOA: 09/19/2019 PCP: Rogers Blocker, MD   Brief Narrative:  Male with prior history of prostate cancer s/p radiation in January 2015, COPD presents to ED with generalized abdominal pain for 1 month associated with abdominal distention and watery bowel movements.  CT of the abdomen and pelvis shows volvulus and complex sigmoid collection.  Patient was started on broad-spectrum IV antibiotics and general surgery consulted.  Patient underwent exploratory laparotomy with diverting colostomy on 09/23/2019.  Patient seen and examined today and currently waiting for bowel function to resume.  Assessment & Plan:   Principal Problem:   Colonic mass Active Problems:   Malignant neoplasm of prostate (HCC)   Liver mass   Mass of colon   Abdominal distension   Large bowel obstruction secondary to perforated rectal cancer status post exploratory laparotomy with diverting colostomy on 09/23/2019. Appreciate general surgery recommendations. Continue with empiric antibiotics and pain control IV fluids.  Patient continues to be n.p.o. currently waiting for bowel function to resume.    Liver mass MRI of the abdomen shows right hepatic lesion favoring metastatic lesion from the colon cancer.  Will probably need a CT-guided liver biopsy by IR once patient is stable.  She will also need follow-up on discharge.     Anemia of chronic disease Transfuse to keep hemoglobin greater than 7.   History of prostate cancer status post radiation treatment in 2015.   Kidney injury probably secondary to prerenal/dehydration Continue with IV fluids.  Hypernatremia Improved continue with D5 half-normal saline fluids.   Nutrition patient is on TPN at this time.  DVT prophylaxis: Lovenox code Status: Full code Family Communication: None and at bedside Disposition Plan: Pending clinical improvement and resumption of bowel function   Consultants:    General surgery  Gastroenterology.  Procedures:   Antimicrobials: aztreonam.  And Flagyl since admission.  Subjective: abd pain , distended, nauseated.   Objective: Vitals:   09/25/19 0644 09/25/19 0830 09/25/19 1035 09/25/19 1428  BP: (!) 135/95  118/88 (!) 122/96  Pulse: 93 98 90 95  Resp: (!) 24 20 19 17   Temp: 98.1 F (36.7 C)  98.5 F (36.9 C) 98.4 F (36.9 C)  TempSrc: Oral  Oral Oral  SpO2: 99%  100% 99%  Weight:      Height:        Intake/Output Summary (Last 24 hours) at 09/25/2019 1657 Last data filed at 09/25/2019 1604 Gross per 24 hour  Intake 2669.44 ml  Output 2850 ml  Net -180.56 ml   Filed Weights   09/19/19 2200 09/23/19 0850  Weight: 70.3 kg 70.3 kg    Examination:  General exam: Appears calm and comfortable  Respiratory system: Clear to auscultation. Respiratory effort normal. Cardiovascular system: S1 & S2 heard, RRR. Gastrointestinal system: Abdomen is distended, soft, JP drain in place, with sero sanguineous fluid in the ostomy.  Central nervous system: Alert and oriented to place and person.  Extremities: Symmetric 5 x 5 power. Skin: No rashes, lesions or ulcers Psychiatry: Mood & affect appropriate.     Data Reviewed: I have personally reviewed following labs and imaging studies  CBC: Recent Labs  Lab 09/21/19 0317 09/22/19 0411 09/23/19 0410 09/24/19 0431 09/25/19 0203  WBC 15.0* 17.0* 13.9* 12.7* 12.0*  NEUTROABS 13.0* 15.3* 12.3* 11.5* 10.6*  HGB 11.5* 11.0* 10.9* 12.7* 12.9*  HCT 34.8* 32.9* 32.6* 40.0 39.5  MCV 93.3 92.9 94.2 95.7 95.6  PLT 354 385  385 466* 123XX123*   Basic Metabolic Panel: Recent Labs  Lab 09/21/19 0317 09/22/19 0411 09/23/19 0410 09/24/19 0431 09/25/19 0203  NA 137 145 147* 151* 150*  K 3.2* 3.4* 3.3* 3.7 3.9  CL 103 109 115* 115* 118*  CO2 22 21* 21* 23 23  GLUCOSE 100* 107* 93 115* 145*  BUN 20 32* 31* 26* 24*  CREATININE 1.11 1.58* 1.33* 1.23 0.92  CALCIUM 8.2* 8.5* 8.8* 8.1* 8.0*    GFR: Estimated Creatinine Clearance: 62 mL/min (by C-G formula based on SCr of 0.92 mg/dL). Liver Function Tests: Recent Labs  Lab 09/19/19 1658  AST 31  ALT 24  ALKPHOS 108  BILITOT 0.9  PROT 7.2  ALBUMIN 2.8*   Recent Labs  Lab 09/19/19 1658  LIPASE 21   No results for input(s): AMMONIA in the last 168 hours. Coagulation Profile: No results for input(s): INR, PROTIME in the last 168 hours. Cardiac Enzymes: No results for input(s): CKTOTAL, CKMB, CKMBINDEX, TROPONINI in the last 168 hours. BNP (last 3 results) No results for input(s): PROBNP in the last 8760 hours. HbA1C: No results for input(s): HGBA1C in the last 72 hours. CBG: No results for input(s): GLUCAP in the last 168 hours. Lipid Profile: No results for input(s): CHOL, HDL, LDLCALC, TRIG, CHOLHDL, LDLDIRECT in the last 72 hours. Thyroid Function Tests: No results for input(s): TSH, T4TOTAL, FREET4, T3FREE, THYROIDAB in the last 72 hours. Anemia Panel: No results for input(s): VITAMINB12, FOLATE, FERRITIN, TIBC, IRON, RETICCTPCT in the last 72 hours. Sepsis Labs: No results for input(s): PROCALCITON, LATICACIDVEN in the last 168 hours.  Recent Results (from the past 240 hour(s))  SARS Coronavirus 2 [facility] order, Performed in [facility] hospital lab) Nasopharyngeal Nasopharyngeal Swab     Status: None   Collection Time: 09/19/19  9:43 PM   Specimen: Nasopharyngeal Swab  Result Value Ref Range Status   SARS Coronavirus 2 NEGATIVE NEGATIVE Final    Comment: (NOTE) If result is NEGATIVE SARS-CoV-2 target nucleic acids are NOT DETECTED. The SARS-CoV-2 RNA is generally detectable in upper and lower  respiratory specimens during the acute phase of infection. The lowest  concentration of SARS-CoV-2 viral copies this assay can detect is 250  copies / mL. A negative result does not preclude SARS-CoV-2 infection  and should not be used as the sole basis for treatment or other  patient management decisions.  A  negative result may occur with  improper specimen collection / handling, submission of specimen other  than nasopharyngeal swab, presence of viral mutation(s) within the  areas targeted by this assay, and inadequate number of viral copies  (<250 copies / mL). A negative result must be combined with clinical  observations, patient history, and epidemiological information. If result is POSITIVE SARS-CoV-2 target nucleic acids are DETECTED. The SARS-CoV-2 RNA is generally detectable in upper and lower  respiratory specimens dur ing the acute phase of infection.  Positive  results are indicative of active infection with SARS-CoV-2.  Clinical  correlation with patient history and other diagnostic information is  necessary to determine patient infection status.  Positive results do  not rule out bacterial infection or co-infection with other viruses. If result is PRESUMPTIVE POSTIVE SARS-CoV-2 nucleic acids MAY BE PRESENT.   A presumptive positive result was obtained on the submitted specimen  and confirmed on repeat testing.  While 2019 novel coronavirus  (SARS-CoV-2) nucleic acids may be present in the submitted sample  additional confirmatory testing may be necessary for epidemiological  and /  or clinical management purposes  to differentiate between  SARS-CoV-2 and other Sarbecovirus currently known to infect humans.  If clinically indicated additional testing with an alternate test  methodology [phone]) is advised. The SARS-CoV-2 RNA is generally  detectable in upper and lower respiratory sp ecimens during the acute  phase of infection. The expected result is Negative. Fact Sheet for Patients:  StrictlyIdeas.no Fact Sheet for Healthcare Providers: BankingDealers.co.za This test is not yet approved or cleared by the [place] FDA and has been authorized for detection and/or diagnosis of SARS-CoV-2 by FDA under an Emergency Use  Authorization (EUA).  This EUA will remain in effect (meaning this test can be used) for the duration of the COVID-19 declaration under Section 564(b)(1) of the Act, 21 U.S.C. section 360bbb-3(b)(1), unless the authorization is terminated or revoked sooner. Performed at [facility], [place] [address] [place], [place] [address]   MRSA PCR Screening     Status: None   Collection Time: 09/20/19  1:25 AM   Specimen: Nasopharyngeal  Result Value Ref Range Status   MRSA by PCR NEGATIVE NEGATIVE Final    Comment:        The GeneXpert MRSA Assay (FDA approved for NASAL specimens only), is one component of a comprehensive MRSA colonization surveillance program. It is not intended to diagnose MRSA infection nor to guide or monitor treatment for MRSA infections. Performed at [facility], [place] [address], [place], [place] [address]          Radiology Studies: Dg Abd Portable 1v  Result Date: 09/25/2019 CLINICAL DATA:  Acute onset abdominal distension today. EXAM: PORTABLE ABDOMEN - 1 VIEW COMPARISON:  Single-view of the abdomen 09/23/2019 and 09/22/2019. FINDINGS: NG tube remains in place in good position. Diffuse gaseous distention of small and large bowel persists. Large colonic stool burden is again seen. Tube projecting over the pelvis with its tip over the left ilium is consistent with a surgical drain. IMPRESSION: Diffuse gaseous distention of small and large bowel compatible with ileus. Large colonic stool burden noted. Electronically Signed   By: Inge Rise M.D.   On: 09/25/2019 11:02   [place] Ekg Site Rite  Result Date: 09/25/2019 If Site Rite image not attached, placement could not be confirmed due to current cardiac rhythm.       Scheduled Meds: . Chlorhexidine Gluconate Cloth  6 each Topical Daily  . enoxaparin (LOVENOX) injection  40 mg Subcutaneous Q24H  . insulin aspart  0-9 Units Subcutaneous Q4H   Continuous Infusions: . aztreonam 200 mL/hr at  09/25/19 1604  . dextrose 5 % and 0.45% NaCl Stopped (09/25/19 1602)  . dextrose 5 % and 0.45% NaCl    . metronidazole 500 mg (09/25/19 1604)  . TPN ADULT (ION)       LOS: 6 days       Hosie Poisson, MD [facility] Pager [phone]   If 7PM-7AM, please contact night-coverage [email or web address] Password [facility] 09/25/2019, 4:57 PM

## 2019-09-25 NOTE — Progress Notes (Signed)
[facility]/Trauma Progress Note  2 Days Post-Op   Assessment/Plan History of prostate cancer Anemia chronic disease AKI Above per medicine  Large bowel obstruction due to possible colorectal cancer versus diverticulitis POD 2 S/P ex lap, diverting loop descending colostomy - Dr. Ninfa Linden 10/4 -Twice daily wet-to-dry dressing changes to midline, continue JP drain  Postop ileus - Patient was clamped yesterday with sips from the floor but had episode of emesis this morning.  NG tube back to LIWS, copious bilious output - Some gas gas in ostomy  and small amount of stool - AXR pending official read but shows dilated loops of small bowel - We will start TPN today 2/2 ileus and n.p.o. status since 10/1 Liver mass - unable to biopsy intraoperatively  - CT guided liver biopsy by IR once pt decompressed   FEN: N.p.o., NG tube to LIWS, ice chips okay, TPN VTE: SCD's, lovenox ID: Flagyl 10/5>>, WBC 12.7, continue to monitor CBC Foley: DC'd today Follow up: Dr. Ninfa Linden  DISPO: TPN, DC Foley await return of bowel function, ambulate, PT consult pending    LOS: 6 days    Subjective: CC: No pain  Patient is complaining about his care.  He states that he is getting terrible care.  He would like to be transferred to [facility].  He states his mother was here at [facility] years ago and told she was going to die but his brother transferred her to [place] and now she is still living.  He denies any abdominal pain at this time.  He states no nausea at this time.  He endorses episode of emesis this morning.  Objective: Vital signs in last 24 hours: Temp:  [97.8 F (36.6 C)-98.1 F (36.7 C)] 98.1 F (36.7 C) (10/07 0644) Pulse Rate:  [91-97] 93 (10/07 0644) Resp:  [24] 24 (10/07 0644) BP: (125-138)/(86-103) 135/95 (10/07 0644) SpO2:  [99 %-100 %] 99 % (10/07 0644) Last BM Date: 09/21/19  Intake/Output from previous day: 10/06 0701 - 10/07 0700 In: 1741.7 [I.V.:1142; IV  Piggyback:599.7] Out: 1950 [Urine:600; Emesis/NG output:1150; Drains:160; Stool:40] Intake/Output this shift: Total I/O In: -  Out: 1000 [Emesis/NG output:1000]  PE: Gen:  Alert, NAD, pleasant, cooperative HEENT: NG tube in place with dark bilious output Pulm: Rate and effort normal Abd: Soft, distended, +BS, JP drain with serosanguineous output, midline incision is clean and without purulent drainage, stoma is large and red, small amount of gas, stool and serosanguineous fluid in ostomy.  No TTP.  No peritonitis Skin: no rashes noted, warm and dry   Anti-infectives: Anti-infectives (From admission, onward)   Start     Dose/Rate Route Frequency Ordered Stop   09/20/19 0800  aztreonam (AZACTAM) 1 g in sodium chloride 0.9 % 100 mL IVPB     1 g 200 mL/hr over 30 Minutes Intravenous Every 8 hours 09/19/19 2246     09/20/19 0700  metroNIDAZOLE (FLAGYL) IVPB 500 mg     500 mg 100 mL/hr over 60 Minutes Intravenous Every 8 hours 09/19/19 2310     09/19/19 2245  aztreonam (AZACTAM) 2 g in sodium chloride 0.9 % 100 mL IVPB     2 g 200 mL/hr over 30 Minutes Intravenous  Once 09/19/19 2236 09/19/19 2332   09/19/19 2245  metroNIDAZOLE (FLAGYL) IVPB 500 mg     500 mg 100 mL/hr over 60 Minutes Intravenous  Once 09/19/19 2236 09/20/19 0110      Lab Results:  Recent Labs    09/24/19 0431 09/25/19 0203  WBC 12.7* 12.0*  HGB 12.7* 12.9*  HCT 40.0 39.5  PLT 466* 413*   BMET Recent Labs    09/24/19 0431 09/25/19 0203  NA 151* 150*  K 3.7 3.9  CL 115* 118*  CO2 23 23  GLUCOSE 115* 145*  BUN 26* 24*  CREATININE 1.23 0.92  CALCIUM 8.1* 8.0*   PT/INR No results for input(s): LABPROT, INR in the last 72 hours. CMP     Component Value Date/Time   NA 150 (H) 09/25/2019 0203   NA 143 12/27/2012   K 3.9 09/25/2019 0203   CL 118 (H) 09/25/2019 0203   CO2 23 09/25/2019 0203   GLUCOSE 145 (H) 09/25/2019 0203   BUN 24 (H) 09/25/2019 0203   BUN 9 12/27/2012   CREATININE 0.92  09/25/2019 0203   CALCIUM 8.0 (L) 09/25/2019 0203   PROT 7.2 09/19/2019 1658   ALBUMIN 2.8 (L) 09/19/2019 1658   AST 31 09/19/2019 1658   ALT 24 09/19/2019 1658   ALKPHOS 108 09/19/2019 1658   BILITOT 0.9 09/19/2019 1658   GFRNONAA >60 09/25/2019 0203   GFRAA >60 09/25/2019 0203   Lipase     Component Value Date/Time   LIPASE 21 09/19/2019 1658    Studies/Results: Dg Abd 1 View  Result Date: 09/23/2019 CLINICAL DATA:  Nasogastric tube advancement. Recent exploratory laparotomy with colostomy. EXAM: ABDOMEN - 1 VIEW COMPARISON:  One view abdomen 09/23/2019 and 09/21/2019. FINDINGS: 1309 hours. The enteric tube has been advanced with the tip at the level of the gastric fundus and the side hole near the GE junction. Mild diffuse bowel distention appears unchanged. There is mild atelectasis at both lung bases. IMPRESSION: The enteric tube has been advanced, tip now overlying the gastric fundus. Electronically Signed   By: Richardean Sale M.D.   On: 09/23/2019 13:34   X-ray Abdomen Ap  Result Date: 09/23/2019 CLINICAL DATA:  Reason for exam: S/P abdominal surgery, follow up exam RN informs there is questionable displacement of NG tube. S/P EXPLORATORY LAPAROTOMY EXAM: ABDOMEN - 1 VIEW COMPARISON:  Abdominal radiograph 09/21/2019 FINDINGS: The nasogastric tube side port tip projects over the mid esophagus. Diffuse gaseous distention of bowel loops similar to prior exams. Lung bases are clear. No acute finding in the visualized skeleton. IMPRESSION: Nasogastric tube tip projects over the mid esophagus and could be advanced approximately 13 cm to reach the stomach. Electronically Signed   By: Audie Pinto M.D.   On: 09/23/2019 12:22     Kalman Drape, [facility] Surgery Pager [phone] Cristine Polio, & Friday 7:00am - 4:30pm Thursdays 7:00am -11:30am  Consults: [phone]

## 2019-09-26 DIAGNOSIS — E43 Unspecified severe protein-calorie malnutrition: Secondary | ICD-10-CM | POA: Insufficient documentation

## 2019-09-26 LAB — CBC
HCT: 39.5 % (ref 39.0–52.0)
Hemoglobin: 12.8 g/dL — ABNORMAL LOW (ref 13.0–17.0)
MCH: 31.2 pg (ref 26.0–34.0)
MCHC: 32.4 g/dL (ref 30.0–36.0)
MCV: 96.3 fL (ref 80.0–100.0)
Platelets: 416 10*3/uL — ABNORMAL HIGH (ref 150–400)
RBC: 4.1 MIL/uL — ABNORMAL LOW (ref 4.22–5.81)
RDW: 13.8 % (ref 11.5–15.5)
WBC: 13.1 10*3/uL — ABNORMAL HIGH (ref 4.0–10.5)
nRBC: 0 % (ref 0.0–0.2)

## 2019-09-26 LAB — COMPREHENSIVE METABOLIC PANEL
ALT: 19 U/L (ref 0–44)
AST: 29 U/L (ref 15–41)
Albumin: 1.8 g/dL — ABNORMAL LOW (ref 3.5–5.0)
Alkaline Phosphatase: 57 U/L (ref 38–126)
Anion gap: 11 (ref 5–15)
BUN: 23 mg/dL (ref 8–23)
CO2: 28 mmol/L (ref 22–32)
Calcium: 8.4 mg/dL — ABNORMAL LOW (ref 8.9–10.3)
Chloride: 114 mmol/L — ABNORMAL HIGH (ref 98–111)
Creatinine, Ser: 0.93 mg/dL (ref 0.61–1.24)
GFR calc Af Amer: >60 mL/min (ref >60)
GFR calc non Af Amer: >60 mL/min (ref >60)
Glucose, Bld: 157 mg/dL — ABNORMAL HIGH (ref 70–99)
Potassium: 3.5 mmol/L (ref 3.5–5.1)
Sodium: 153 mmol/L — ABNORMAL HIGH (ref 135–145)
Total Bilirubin: 0.5 mg/dL (ref 0.3–1.2)
Total Protein: 5.3 g/dL — ABNORMAL LOW (ref 6.5–8.1)

## 2019-09-26 LAB — GLUCOSE, CAPILLARY
Glucose-Capillary: 109 mg/dL — ABNORMAL HIGH (ref 70–99)
Glucose-Capillary: 115 mg/dL — ABNORMAL HIGH (ref 70–99)
Glucose-Capillary: 128 mg/dL — ABNORMAL HIGH (ref 70–99)
Glucose-Capillary: 133 mg/dL — ABNORMAL HIGH (ref 70–99)
Glucose-Capillary: 135 mg/dL — ABNORMAL HIGH (ref 70–99)
Glucose-Capillary: 149 mg/dL — ABNORMAL HIGH (ref 70–99)

## 2019-09-26 LAB — DIFFERENTIAL
Abs Immature Granulocytes: 0.1 10*3/uL — ABNORMAL HIGH (ref 0.00–0.07)
Basophils Absolute: 0 10*3/uL (ref 0.0–0.1)
Basophils Relative: 0 %
Eosinophils Absolute: 0 10*3/uL (ref 0.0–0.5)
Eosinophils Relative: 0 %
Immature Granulocytes: 1 %
Lymphocytes Relative: 8 %
Lymphs Abs: 1.1 10*3/uL (ref 0.7–4.0)
Monocytes Absolute: 0.6 10*3/uL (ref 0.1–1.0)
Monocytes Relative: 4 %
Neutro Abs: 11.4 10*3/uL — ABNORMAL HIGH (ref 1.7–7.7)
Neutrophils Relative %: 87 %

## 2019-09-26 LAB — MAGNESIUM: Magnesium: 1.9 mg/dL (ref 1.7–2.4)

## 2019-09-26 LAB — TRIGLYCERIDES: Triglycerides: 72 mg/dL (ref <150)

## 2019-09-26 LAB — PHOSPHORUS: Phosphorus: 2.3 mg/dL — ABNORMAL LOW (ref 2.5–4.6)

## 2019-09-26 LAB — PREALBUMIN: Prealbumin: 9.7 mg/dL — ABNORMAL LOW (ref 18–38)

## 2019-09-26 MED ORDER — TRAVASOL 10 % IV SOLN
INTRAVENOUS | Status: AC
  Administered 2019-09-26: 18:00:00 via INTRAVENOUS
  Filled 2019-09-26: qty 636

## 2019-09-26 MED ORDER — MAGNESIUM SULFATE 2 GM/50ML IV SOLN
2.0000 g | Freq: Once | INTRAVENOUS | Status: AC
  Administered 2019-09-26: 10:00:00 2 g via INTRAVENOUS
  Filled 2019-09-26: qty 50

## 2019-09-26 MED ORDER — POTASSIUM PHOSPHATES 15 MMOLE/5ML IV SOLN
20.0000 mmol | Freq: Once | INTRAVENOUS | Status: AC
  Administered 2019-09-26: 08:00:00 20 mmol via INTRAVENOUS
  Filled 2019-09-26: qty 6.67

## 2019-09-26 MED ORDER — DEXTROSE-NACL 5-0.45 % IV SOLN
INTRAVENOUS | Status: DC
  Administered 2019-09-26 – 2019-10-05 (×11): via INTRAVENOUS

## 2019-09-26 NOTE — Progress Notes (Signed)
Nutrition Follow-up  RD working remotely.  DOCUMENTATION CODES:   Severe malnutrition in context of chronic illness  INTERVENTION:   -TPN management per pharmacy -RD will follow for diet advancement and supplement as appropriate  NUTRITION DIAGNOSIS:   Severe Malnutrition related to chronic illness(rectal cancer) as evidenced by severe muscle depletion, severe fat depletion, moderate fat depletion, moderate muscle depletion.  Ongoing  GOAL:   Patient will meet greater than or equal to 90% of their needs  Progressing   MONITOR:   Diet advancement, Labs, Weight trends, Skin, I & O's  REASON FOR ASSESSMENT:   Consult New TPN/TNA  ASSESSMENT:   Charles Espinoza is a 78 y.o. male with medical history significant of prostate CA s/p radiation finished back in Jan 2015.Patient presents to the ED with c/o generalized abd pain for the past 1 month.  Worsening especially over the past 1 eek with abd distention.  Pain around umbilicus today.  Constant symptoms.  Loose watery stool with one episode earlier this AM.  Is passing gas still.  10/2- MRI suggestive for colon mass (malignant ulceration) liver lesion worrisome for malignancy 10/3- NGT placed 10/4- per general surgery notes, likely perforated rectal cancer that has been leaking air and liver mass 10/5- CAT scan revealed likely rectal cancer with mets to liver and persistent obstruction; s/p Procedure(s): EXPLORATORY LAPAROTOMY; DIVERTING LOOP DESCENDING COLOSTOMY 10/6- NGT clamping trials started, per RN tolerated well 10/7- NGT resumed to low, intermittent suction, PICC placed, TPN initiated  Reviewed I/O's: -817 ml x 24 hours and +2.3 L since admission  UOP: 350 ml x 24 hours  NGT output: 387 ml x 24 hours  Colostomy output: 30 ml x 24 hours  Spoke with pt at bedside, who had a flat affect today. He verbalized to this RD that he is not sure that he is getting better. He is concerned he is unable to eat and is requesting  Ensure. RD explained rationale for NPO status and NGT. Also explained to pt how he will receive his nutrition (TPN) until he is able to tolerate PO diet.   Pt reports that he had a good appetite PTA. He was consuming 2 meals per day and consuming one Ensure supplement daily. Pt reports that he had becoming weaker PTA and was unable to do his yard work; his sons have been helping him at home for quite some time, however, unsure when this started occurring. Pt also expressed concern about weight loss. He shares his UBW is around 160# and had lost about 30#. However, this is not consistent with wt hx.   Pt currently receiving TPN at 30 ml/hr, which provides 800 kcals, and 36 grams protein, meeting 44% of estimated kcal and 40% of estimated protein needs. Per pharmacy notes, plan to increase to 50 ml/hr, which will provide 1349 kcals and 64 grams protein, meeting 75% of estimated kcal needs and 71% of estimated protein needs.   Noted palliative care consult pending for goals of care.   Labs reviewed: Na: 153, CBGS: 123-149 (inpatient orders for glycemic control are 0-9 units insulin aspart every 4 hours).   NUTRITION - FOCUSED PHYSICAL EXAM:    Most Recent Value  Orbital Region  Severe depletion  Upper Arm Region  Moderate depletion  Thoracic and Lumbar Region  Severe depletion  Buccal Region  Moderate depletion  Temple Region  Severe depletion  Clavicle Bone Region  Severe depletion  Clavicle and Acromion Bone Region  Severe depletion  Scapular Bone Region  Severe depletion  Dorsal Hand  Severe depletion  Patellar Region  Severe depletion  Anterior Thigh Region  Severe depletion  Posterior Calf Region  Severe depletion  Edema (RD Assessment)  None  Hair  Reviewed  Eyes  Reviewed  Mouth  Reviewed  Skin  Reviewed  Nails  Reviewed       Diet Order:   Diet Order            Diet NPO time specified Except for: Ice Chips, Sips with Meds  Diet effective now              EDUCATION  NEEDS:   Education needs have been addressed  Skin:  Skin Assessment: Skin Integrity Issues: Skin Integrity Issues:: Incisions Incisions: closed abdomen  Last BM:  09/26/19 (100 ml via colostomy)  Height:   Ht Readings from Last 1 Encounters:  09/23/19 5\' 8"  (1.727 m)    Weight:   Wt Readings from Last 1 Encounters:  09/23/19 70.3 kg    Ideal Body Weight:  70 kg  BMI:  Body mass index is 23.57 kg/m.  Estimated Nutritional Needs:   Kcal:  1800-2000  Protein:  90-105 grams  Fluid:  > 1.8 L    Charles Espinoza, RD, LDN, [place] Registered Dietitian II Certified Diabetes Care and Education Specialist Pager: [phone] After hours Pager: [phone]

## 2019-09-26 NOTE — Progress Notes (Signed)
[facility]/Trauma Progress Note  3 Days Post-Op   Assessment/Plan History of prostate cancer Anemia chronic disease AKI Above per medicine  Large bowel obstruction due to possible colorectal cancer versus diverticulitis POD 2 S/P ex lap, diverting loop descending colostomy - Dr. Ninfa Linden 10/4 -Twice daily wet-to-dry dressing changes to midline, continue JP drain Postop ileus - NG tube to LIWS, copious bilious output - Some gas gas in ostomy and small amount of stool - AXR 10/07 showed ileus - TPN started on 10/07 2/2 ileus and n.p.o. status since 10/1 Liver mass - unable to biopsy intraoperatively  - CT guided liver biopsy by IR once pt decompressed   FEN: N.p.o., NG tube to LIWS, ice chips okay, TPN VTE: SCD's, lovenox ID: Flagyl 10/5>>, WBC 12.7, continue to monitor CBC Foley: DC'd today Follow up: Dr. Ninfa Linden  DISPO: TPN, await return of bowel function, ambulate   LOS: 7 days    Subjective: CC: can't urinate  No abdominal pain. He was not nauseated this am. Nurse advanced his NGT this am and got out 1500cc in 20 minutes or so. She got report that he had no NGT output overnight. He stated a few BM's in his bag overnight. No nausea or vomiting. He is tender in his upper abdomen on exam. Discussed continuing NGT until his intestines wake up. He expressed understanding.   Objective: Vital signs in last 24 hours: Temp:  [98 F (36.7 C)-98.5 F (36.9 C)] 98 F (36.7 C) (10/08 0434) Pulse Rate:  [90-98] 94 (10/08 0434) Resp:  [17-20] 20 (10/08 0434) BP: (118-143)/(88-96) 143/96 (10/08 0434) SpO2:  [98 %-100 %] 99 % (10/08 0434) Last BM Date: 09/25/19  Intake/Output from previous day: 10/07 0701 - 10/08 0700 In: 1551.3 [P.O.:60; I.V.:1091.2; IV Piggyback:400.1] Out: 2367 [Urine:350; Emesis/NG output:1600; Drains:387; Stool:30] Intake/Output this shift: No intake/output data recorded.  PE: Gen:  Alert, NAD, pleasant, cooperative HEENT: NG tube  in place with dark bilious output Pulm: Rate and effort normal Abd: Soft, distended, +BS, JP drain with serosanguineous output, midline incision is clean and without purulent drainage, stoma is large and more dark red today, copious gas, small amt of liquid stool and serosanguineous fluid in ostomy.  TTP to epigastric region with guarding.  No peritonitis Skin: no rashes noted, warm and dry   Anti-infectives: Anti-infectives (From admission, onward)   Start     Dose/Rate Route Frequency Ordered Stop   09/25/19 1600  aztreonam (AZACTAM) 2 g in sodium chloride 0.9 % 100 mL IVPB     2 g 200 mL/hr over 30 Minutes Intravenous Every 8 hours 09/25/19 0938     09/20/19 0800  aztreonam (AZACTAM) 1 g in sodium chloride 0.9 % 100 mL IVPB  Status:  Discontinued     1 g 200 mL/hr over 30 Minutes Intravenous Every 8 hours 09/19/19 2246 09/25/19 0938   09/20/19 0700  metroNIDAZOLE (FLAGYL) IVPB 500 mg     500 mg 100 mL/hr over 60 Minutes Intravenous Every 8 hours 09/19/19 2310     09/19/19 2245  aztreonam (AZACTAM) 2 g in sodium chloride 0.9 % 100 mL IVPB     2 g 200 mL/hr over 30 Minutes Intravenous  Once 09/19/19 2236 09/19/19 2332   09/19/19 2245  metroNIDAZOLE (FLAGYL) IVPB 500 mg     500 mg 100 mL/hr over 60 Minutes Intravenous  Once 09/19/19 2236 09/20/19 0110      Lab Results:  Recent Labs    09/25/19 0203 09/26/19 0417  WBC 12.0* 13.1*  HGB 12.9* 12.8*  HCT 39.5 39.5  PLT 413* 416*   BMET Recent Labs    09/25/19 0203 09/26/19 0417  NA 150* 153*  K 3.9 3.5  CL 118* 114*  CO2 23 28  GLUCOSE 145* 157*  BUN 24* 23  CREATININE 0.92 0.93  CALCIUM 8.0* 8.4*   PT/INR No results for input(s): LABPROT, INR in the last 72 hours. CMP     Component Value Date/Time   NA 153 (H) 09/26/2019 0417   NA 143 12/27/2012   K 3.5 09/26/2019 0417   CL 114 (H) 09/26/2019 0417   CO2 28 09/26/2019 0417   GLUCOSE 157 (H) 09/26/2019 0417   BUN 23 09/26/2019 0417   BUN 9 12/27/2012    CREATININE 0.93 09/26/2019 0417   CALCIUM 8.4 (L) 09/26/2019 0417   PROT 5.3 (L) 09/26/2019 0417   ALBUMIN 1.8 (L) 09/26/2019 0417   AST 29 09/26/2019 0417   ALT 19 09/26/2019 0417   ALKPHOS 57 09/26/2019 0417   BILITOT 0.5 09/26/2019 0417   GFRNONAA >60 09/26/2019 0417   GFRAA >60 09/26/2019 0417   Lipase     Component Value Date/Time   LIPASE 21 09/19/2019 1658    Studies/Results: Dg Abd Portable 1v  Result Date: 09/25/2019 CLINICAL DATA:  Acute onset abdominal distension today. EXAM: PORTABLE ABDOMEN - 1 VIEW COMPARISON:  Single-view of the abdomen 09/23/2019 and 09/22/2019. FINDINGS: NG tube remains in place in good position. Diffuse gaseous distention of small and large bowel persists. Large colonic stool burden is again seen. Tube projecting over the pelvis with its tip over the left ilium is consistent with a surgical drain. IMPRESSION: Diffuse gaseous distention of small and large bowel compatible with ileus. Large colonic stool burden noted. Electronically Signed   By: Inge Rise M.D.   On: 09/25/2019 11:02   [place] Ekg Site Rite  Result Date: 09/25/2019 If Site Rite image not attached, placement could not be confirmed due to current cardiac rhythm.    Kalman Drape, [facility] Pager [phone] Cristine Polio, & Friday 7:00am - 4:30pm Thursdays 7:00am -11:30am  Consults: [phone]

## 2019-09-26 NOTE — Progress Notes (Signed)
PROGRESS NOTE    Charles Espinoza  [id number] DOB: 1941/05/30 DOA: 09/19/2019 PCP: Rogers Blocker, MD   Brief Narrative:  Male with prior history of prostate cancer s/p radiation in January 2015, COPD presents to ED with generalized abdominal pain for 1 month associated with abdominal distention and watery bowel movements.  CT of the abdomen and pelvis shows volvulus and complex sigmoid collection.  Patient was started on broad-spectrum IV antibiotics and general surgery consulted.  Patient underwent exploratory laparotomy with diverting colostomy on 09/23/2019.  Patient seen and examined today and currently waiting for bowel function to resume Appreciate surgery input and assistance.  Assessment & Plan:   Principal Problem:   Colonic mass Active Problems:   Malignant neoplasm of prostate (HCC)   Liver mass   Mass of colon   Abdominal distension   Protein-calorie malnutrition, severe   Large bowel obstruction secondary to perforated rectal cancer status post exploratory laparotomy with diverting colostomy on 09/22/2019. Patient continues to have post op ileus, has NG tube connected to intermittent suction, has bilious output >1200 this shift. Colostomy bag with some stool and gas.  Appreciate general surgery recommendations. Continue with empiric antibiotics IV aztreonam and IV FLAGYL, IV fluids and pain control.  Patient continues to be n.p.o. currently waiting for bowel function to resume. Repeat ABD x ray in am.  Get pt OOB to chair and ambulate. PT evaluation ordered.  Pt remains afebrile but has persistent leukocytosis.    Liver mass MRI of the abdomen shows right hepatic lesion favoring metastatic lesion from the colon cancer.  Will probably need a CT-guided liver biopsy by IR once patient is stable.  He will also need follow-up with oncology on discharge.     Anemia of chronic disease Transfuse to keep hemoglobin greater than 7. Hemoglobin stable around 12.    History  of prostate cancer  status post radiation treatment in 2015.   Acute Kidney injury probably secondary to prerenal/dehydration Improved with IV fluids.  Continue with IV fluids. Repeat renal parameters in am.   Hypernatremia worsening, increase dextrose fluids to 75 ml/hr.   Hypophosphatemia:  Due to severe protein calorie malnutrition.    Nutrition patient is on TPN at this time.  Acute urinary retention:  Twice in the last 24 hours.  Foley catheter placed today.  Check ua.    DVT prophylaxis: Lovenox  code Status: Full code Family Communication: None and at bedside. Discussed the plan with his wife over the phone.  Disposition Plan: Pending clinical improvement and resumption of bowel function and palliative care consult.    Consultants:   General surgery  Gastroenterology.  Palliative care consult.   Procedures: S/Pex lap, diverting loop descending colostomy - Dr. Ninfa Linden 10/4  Antimicrobials: aztreonam.  And Flagyl since admission.  Subjective: Pt reports not feeling good, has abdominal distention. With abdominal pain. Nauseated.   Objective: Vitals:   09/25/19 1815 09/25/19 2125 09/26/19 0434 09/26/19 1525  BP: (!) 143/96 121/88 (!) 143/96 112/90  Pulse: 96 97 94 91  Resp: 18 19 20 18   Temp: 98.1 F (36.7 C) 98.4 F (36.9 C) 98 F (36.7 C) 98.2 F (36.8 C)  TempSrc: Oral Oral Oral Oral  SpO2: 98% 99% 99% 98%  Weight:      Height:        Intake/Output Summary (Last 24 hours) at 09/26/2019 1739 Last data filed at 09/26/2019 1200 Gross per 24 hour  Intake 623.48 ml  Output 3307 ml  Net -2683.52 ml  Filed Weights   09/19/19 2200 09/23/19 0850  Weight: 70.3 kg 70.3 kg    Examination:  General exam: mild distress from abdominal distention. Respiratory system: diminished at bases, no wheezing heard.  Cardiovascular system: S1 s2 heard. RRR.  Gastrointestinal system:  abd is soft, distended, tender generalized, with JP drain in place.  Colostomy bag  With stool and gas.  Central nervous system: alert and answering questions appropriately.  Extremities: no pedal edema .  Skin: No rashes seen.  Psychiatry: mood is appropriate.     Data Reviewed: I have personally reviewed following labs and imaging studies  CBC: Recent Labs  Lab 09/22/19 0411 09/23/19 0410 09/24/19 0431 09/25/19 0203 09/26/19 0417  WBC 17.0* 13.9* 12.7* 12.0* 13.1*  NEUTROABS 15.3* 12.3* 11.5* 10.6* 11.4*  HGB 11.0* 10.9* 12.7* 12.9* 12.8*  HCT 32.9* 32.6* 40.0 39.5 39.5  MCV 92.9 94.2 95.7 95.6 96.3  PLT 385 385 466* 413* 123456*   Basic Metabolic Panel: Recent Labs  Lab 09/22/19 0411 09/23/19 0410 09/24/19 0431 09/25/19 0203 09/26/19 0417  NA 145 147* 151* 150* 153*  K 3.4* 3.3* 3.7 3.9 3.5  CL 109 115* 115* 118* 114*  CO2 21* 21* 23 23 28   GLUCOSE 107* 93 115* 145* 157*  BUN 32* 31* 26* 24* 23  CREATININE 1.58* 1.33* 1.23 0.92 0.93  CALCIUM 8.5* 8.8* 8.1* 8.0* 8.4*  MG  --   --   --   --  1.9  PHOS  --   --   --   --  2.3*   GFR: Estimated Creatinine Clearance: 61.3 mL/min (by C-G formula based on SCr of 0.93 mg/dL). Liver Function Tests: Recent Labs  Lab 09/26/19 0417  AST 29  ALT 19  ALKPHOS 57  BILITOT 0.5  PROT 5.3*  ALBUMIN 1.8*   No results for input(s): LIPASE, AMYLASE in the last 168 hours. No results for input(s): AMMONIA in the last 168 hours. Coagulation Profile: No results for input(s): INR, PROTIME in the last 168 hours. Cardiac Enzymes: No results for input(s): CKTOTAL, CKMB, CKMBINDEX, TROPONINI in the last 168 hours. BNP (last 3 results) No results for input(s): PROBNP in the last 8760 hours. HbA1C: No results for input(s): HGBA1C in the last 72 hours. CBG: Recent Labs  Lab 09/25/19 2030 09/25/19 2337 09/26/19 0413 09/26/19 0753 09/26/19 1228  GLUCAP 153* 123* 133* 149* 135*   Lipid Profile: Recent Labs    09/26/19 0417  TRIG 72   Thyroid Function Tests: No results for input(s): TSH,  T4TOTAL, FREET4, T3FREE, THYROIDAB in the last 72 hours. Anemia Panel: No results for input(s): VITAMINB12, FOLATE, FERRITIN, TIBC, IRON, RETICCTPCT in the last 72 hours. Sepsis Labs: No results for input(s): PROCALCITON, LATICACIDVEN in the last 168 hours.  Recent Results (from the past 240 hour(s))  SARS Coronavirus 2 [facility] order, Performed in [facility] hospital lab) Nasopharyngeal Nasopharyngeal Swab     Status: None   Collection Time: 09/19/19  9:43 PM   Specimen: Nasopharyngeal Swab  Result Value Ref Range Status   SARS Coronavirus 2 NEGATIVE NEGATIVE Final    Comment: (NOTE) If result is NEGATIVE SARS-CoV-2 target nucleic acids are NOT DETECTED. The SARS-CoV-2 RNA is generally detectable in upper and lower  respiratory specimens during the acute phase of infection. The lowest  concentration of SARS-CoV-2 viral copies this assay can detect is 250  copies / mL. A negative result does not preclude SARS-CoV-2 infection  and should not be used as the sole basis  for treatment or other  patient management decisions.  A negative result may occur with  improper specimen collection / handling, submission of specimen other  than nasopharyngeal swab, presence of viral mutation(s) within the  areas targeted by this assay, and inadequate number of viral copies  (<250 copies / mL). A negative result must be combined with clinical  observations, patient history, and epidemiological information. If result is POSITIVE SARS-CoV-2 target nucleic acids are DETECTED. The SARS-CoV-2 RNA is generally detectable in upper and lower  respiratory specimens dur ing the acute phase of infection.  Positive  results are indicative of active infection with SARS-CoV-2.  Clinical  correlation with patient history and other diagnostic information is  necessary to determine patient infection status.  Positive results do  not rule out bacterial infection or co-infection with other viruses. If result is  PRESUMPTIVE POSTIVE SARS-CoV-2 nucleic acids MAY BE PRESENT.   A presumptive positive result was obtained on the submitted specimen  and confirmed on repeat testing.  While 2019 novel coronavirus  (SARS-CoV-2) nucleic acids may be present in the submitted sample  additional confirmatory testing may be necessary for epidemiological  and / or clinical management purposes  to differentiate between  SARS-CoV-2 and other Sarbecovirus currently known to infect humans.  If clinically indicated additional testing with an alternate test  methodology [phone]) is advised. The SARS-CoV-2 RNA is generally  detectable in upper and lower respiratory sp ecimens during the acute  phase of infection. The expected result is Negative. Fact Sheet for Patients:  StrictlyIdeas.no Fact Sheet for Healthcare Providers: BankingDealers.co.za This test is not yet approved or cleared by the [place] FDA and has been authorized for detection and/or diagnosis of SARS-CoV-2 by FDA under an Emergency Use Authorization (EUA).  This EUA will remain in effect (meaning this test can be used) for the duration of the COVID-19 declaration under Section 564(b)(1) of the Act, 21 U.S.C. section 360bbb-3(b)(1), unless the authorization is terminated or revoked sooner. Performed at [facility], [place] [address] [place], [place] [address]   MRSA PCR Screening     Status: None   Collection Time: 09/20/19  1:25 AM   Specimen: Nasopharyngeal  Result Value Ref Range Status   MRSA by PCR NEGATIVE NEGATIVE Final    Comment:        The GeneXpert MRSA Assay (FDA approved for NASAL specimens only), is one component of a comprehensive MRSA colonization surveillance program. It is not intended to diagnose MRSA infection nor to guide or monitor treatment for MRSA infections. Performed at [facility], [place] [address], [place], [place] [address]          Radiology  Studies: Dg Abd Portable 1v  Result Date: 09/25/2019 CLINICAL DATA:  Acute onset abdominal distension today. EXAM: PORTABLE ABDOMEN - 1 VIEW COMPARISON:  Single-view of the abdomen 09/23/2019 and 09/22/2019. FINDINGS: NG tube remains in place in good position. Diffuse gaseous distention of small and large bowel persists. Large colonic stool burden is again seen. Tube projecting over the pelvis with its tip over the left ilium is consistent with a surgical drain. IMPRESSION: Diffuse gaseous distention of small and large bowel compatible with ileus. Large colonic stool burden noted. Electronically Signed   By: Inge Rise M.D.   On: 09/25/2019 11:02   [place] Ekg Site Rite  Result Date: 09/25/2019 If Site Rite image not attached, placement could not be confirmed due to current cardiac rhythm.  Scheduled Meds: . Chlorhexidine Gluconate Cloth  6 each Topical Daily  . enoxaparin (LOVENOX) injection  40 mg Subcutaneous Q24H  . insulin aspart  0-9 Units Subcutaneous Q4H   Continuous Infusions: . aztreonam 2 g (09/26/19 1706)  . dextrose 5 % and 0.45% NaCl    . dextrose 5 % and 0.45% NaCl 25 mL/hr at 09/26/19 1706  . metronidazole 500 mg (09/26/19 1532)  . TPN ADULT (ION) 30 mL/hr at 09/26/19 0307  . TPN ADULT (ION)       LOS: 7 days       Hosie Poisson, MD [facility] Pager [phone]   If 7PM-7AM, please contact night-coverage [email or web address] Password [facility] 09/26/2019, 5:39 PM

## 2019-09-26 NOTE — Progress Notes (Signed)
Patient looks uncomfortable, claimimg he did not urinate since yesterday. Bladder scan done showing more than 562ml. Md paged. I&o cath done with an output of 858ml urine. Will continue to monitor

## 2019-09-26 NOTE — Progress Notes (Signed)
Patient verbalizes he is not feeling good but refused pain med. Bladder scan done and noted more than 988ml urine in the bladder. Foley inserted.

## 2019-09-26 NOTE — Progress Notes (Addendum)
NGT out re advanced, output from NGT 525ml after advancing the tube to stomach.

## 2019-09-26 NOTE — Progress Notes (Signed)
PHARMACY - ADULT TOTAL PARENTERAL NUTRITION CONSULT NOTE   Pharmacy Consult for TPN Indication: prolonged ileus and NPO status  Patient Measurements: Height: 5\' 8"  (172.7 cm) Weight: 155 lb (70.3 kg) IBW/kg (Calculated) : 68.4 TPN AdjBW (KG): 70.3 Body mass index is 23.57 kg/m.  Assessment:  78 year old male presented to the hospital with abdominal pain. Found to have a large bowel obstruction due to possible colorectal cancer vs diverticulitis. S/p ex lap with diverting loop descending colostomy on 10/5. Pt now with ileus and prolonged NPO status since 10/1 so will begin TPN while awaiting return of bowel function.  GI: NG clamped 10/6 but with emesis so back to LIWS - NG output 1600cc; prealbumin 9.7, TG 72, LBM 10/7 Endo: No hx DM Insulin requirements in the past 24 hours: 4 Lytes: Na elevated at 153, K 3.5 (goal >/= 4), Mg 1.9 (goal >/= 2), Phos 2.3, CoCa 10.2 Renal: [place] 0.93, UOP 0.2 Pulm: RA Cards: VSS Hepatobil: LFTs + Tbili WNL Neuro: A&Ox4, prn morphine, prn APAP ID: Aztreonam + flagyl for IAI - Afebrile, WBC 13.1  TPN Access: PICC placed 10/7 TPN start date: 10/7 Nutritional Goals (per RD recommendation on 10/7): KCal: 1750-1950  Protein: 85-100g Fluid: >1.7L  Goal TPN rate is 70 ml/hr (provides 89g protein, 1888 KCal)  Current Nutrition:   Plan:  Increase TPN to 78mL/hr - increasing conservatively due to risk of refeeding syndrome This TPN provides 63.6 g of protein, 216 g of dextrose, and 36 g of lipids which provides 1349 kCals per day, meeting 71% of patient needs Electrolytes in TPN: remove sodium, leave other electrolyte concentrations the same but pt will receive more with increasing rate of TPN Add MVI, trace elements to TPN Continue SSI for now - may be able to discontinue if CBGs remain well controlled once TPN is at goal rate Reduce IVMF to 25 ml/hr when new TPN starts tonight KPhos 47mmol x 1, Mg 2gm IV x 1 Monitor TPN labs and signs of  refeeding  Janielle Mittelstadt, Rande Lawman 09/26/2019,7:25 AM

## 2019-09-27 ENCOUNTER — Inpatient Hospital Stay (HOSPITAL_COMMUNITY): Payer: Medicare Other

## 2019-09-27 DIAGNOSIS — K56609 Unspecified intestinal obstruction, unspecified as to partial versus complete obstruction: Secondary | ICD-10-CM

## 2019-09-27 DIAGNOSIS — Z515 Encounter for palliative care: Secondary | ICD-10-CM

## 2019-09-27 LAB — URINALYSIS, ROUTINE W REFLEX MICROSCOPIC
Bacteria, UA: NONE SEEN
Bilirubin Urine: NEGATIVE
Glucose, UA: NEGATIVE mg/dL
Ketones, ur: NEGATIVE mg/dL
Nitrite: NEGATIVE
Protein, ur: 30 mg/dL — AB
RBC / HPF: >50 RBC/hpf — ABNORMAL HIGH (ref 0–5)
Specific Gravity, Urine: 1.026 (ref 1.005–1.030)
pH: 5 (ref 5.0–8.0)

## 2019-09-27 LAB — PHOSPHORUS: Phosphorus: 3 mg/dL (ref 2.5–4.6)

## 2019-09-27 LAB — BASIC METABOLIC PANEL
Anion gap: 8 (ref 5–15)
BUN: 17 mg/dL (ref 8–23)
CO2: 29 mmol/L (ref 22–32)
Calcium: 7.9 mg/dL — ABNORMAL LOW (ref 8.9–10.3)
Chloride: 115 mmol/L — ABNORMAL HIGH (ref 98–111)
Creatinine, Ser: 0.79 mg/dL (ref 0.61–1.24)
GFR calc Af Amer: >60 mL/min (ref >60)
GFR calc non Af Amer: >60 mL/min (ref >60)
Glucose, Bld: 141 mg/dL — ABNORMAL HIGH (ref 70–99)
Potassium: 3.1 mmol/L — ABNORMAL LOW (ref 3.5–5.1)
Sodium: 152 mmol/L — ABNORMAL HIGH (ref 135–145)

## 2019-09-27 LAB — GLUCOSE, CAPILLARY
Glucose-Capillary: 144 mg/dL — ABNORMAL HIGH (ref 70–99)
Glucose-Capillary: 130 mg/dL — ABNORMAL HIGH (ref 70–99)
Glucose-Capillary: 154 mg/dL — ABNORMAL HIGH (ref 70–99)
Glucose-Capillary: 99 mg/dL (ref 70–99)
Glucose-Capillary: 151 mg/dL — ABNORMAL HIGH (ref 70–99)

## 2019-09-27 LAB — MAGNESIUM: Magnesium: 1.9 mg/dL (ref 1.7–2.4)

## 2019-09-27 MED ORDER — TRAVASOL 10 % IV SOLN
INTRAVENOUS | Status: AC
  Administered 2019-09-27: 18:00:00 via INTRAVENOUS
  Filled 2019-09-27: qty 990

## 2019-09-27 MED ORDER — POTASSIUM CHLORIDE 10 MEQ/50ML IV SOLN
10.0000 meq | INTRAVENOUS | Status: AC
  Administered 2019-09-27 (×5): 10 meq via INTRAVENOUS
  Filled 2019-09-27 (×5): qty 50

## 2019-09-27 MED ORDER — MAGNESIUM SULFATE 2 GM/50ML IV SOLN
2.0000 g | Freq: Once | INTRAVENOUS | Status: AC
  Administered 2019-09-27: 10:00:00 2 g via INTRAVENOUS
  Filled 2019-09-27: qty 50

## 2019-09-27 MED ORDER — METOCLOPRAMIDE HCL 5 MG/ML IJ SOLN
10.0000 mg | Freq: Three times a day (TID) | INTRAMUSCULAR | Status: AC
  Administered 2019-09-27 (×2): 10 mg via INTRAVENOUS
  Filled 2019-09-27 (×2): qty 2

## 2019-09-27 NOTE — Consult Note (Signed)
Consultation Note Date: 09/27/2019   Patient Name: Charles Espinoza  DOB: June 21, 1941  MRN: [id number]  Age / Sex: 78 y.o., male  PCP: Rogers Blocker, MD Referring Physician: Hosie Poisson, MD  Reason for Consultation: Establishing goals of care and Psychosocial/spiritual support  HPI/Patient Profile: 78 y.o. male  with past medical history of prostate cancer s/p radiation, COPD who was admitted on 09/19/2019 with a large bowel obstruction (volvulus) and underwent surgery 10/4 during which a diverting loop colostomy was created.  Imaging reveal a liver mass as well (7.9 x 7.5 cm). Since surgery his stay has been complicated by post op ileus and an inability to eat.  TPN was started.  An N/G is in placed which is draining large amount of bilious green fluid.  Sodium has slowly risen and is now 152.  Clinical Assessment and Goals of Care:  I have reviewed medical records including EPIC notes, labs and imaging, received report from Dr. Karleen Hampshire, assessed the patient and then met at the bedside along with his wife  to discuss diagnosis prognosis, GOC, EOL wishes, disposition and options.  I introduced Palliative Medicine as specialized medical care for people living with serious illness. It focuses on providing relief from the symptoms and stress of a serious illness. The goal is to improve quality of life for both the patient and the family.  We discussed a brief life review of the patient. He worked as a Curator (both residential and commercial).  He and Charles Espinoza has 2 boys and a girl that live locally.  He tells me he has 4 grand children.  He is [employer].  As far as functional and nutritional status, Charles Espinoza was independent of ADLs at home 1 month prior to admission.  He was walking, running errands and fully able to function - even cutting the grass.  He had not been eating well since he lost a partial denture and had a  peridontal infection 2 years ago.  Charles Espinoza noted he had lost weight but didn't give it much thought.  He had started drinking ensure regularly because he was not eating well.  We discussed the possible diagnosis of metastatic colon cancer.  We talked about the fact that any chemotherapy would be aimed at extending his life but that this cancer is not "curable".  We also discussed that he would have to be a lot stronger to even consider taking chemotherapy.  I attempted to elicit values and goals of care important to the patient.  Charles Espinoza told me he would want to be a full code and would do anything in order to be able to live.  We talked further about quality of life.  After our talk Charles Espinoza stated that if he couldn't walk, eat, care for himself, or hold his grandchildren, he would not want to be here.  At this point he remains a full code / full scope treatment.  Questions and concerns were addressed.  The family was encouraged to call with questions or concerns.  I committed to follow up with Mr. And Mrs. Espinoza again tomorrow.     Primary Decision Maker:  PATIENT and his wife Charles Espinoza.    SUMMARY OF RECOMMENDATIONS    PMT will follow with you.  I encouraged Charles Espinoza to be out of bed with assistance.  Chaplain support requested   Code Status/Advance Care Planning:  Full, but no long term life support.   Symptom Management:   Per primary team and Surgery.  Additional Recommendations (Limitations, Scope, Preferences):  Full Scope Treatment  Psycho-social/Spiritual:   Desire for further Chaplaincy support: requested.  Prognosis:  Concerned that he may have less than 6 months given his metastatic colon cancer.  I'm uncertain he'll be strong enough for chemotherapy.   Discharge Planning: To Be Determined      Primary Diagnoses: Present on Admission: . Malignant neoplasm of prostate ([place]) . Liver mass . Colonic mass . Mass of colon   I have reviewed the medical  record, interviewed the patient and family, and examined the patient. The following aspects are pertinent.  Past Medical History:  Diagnosis Date  . COPD (chronic obstructive pulmonary disease) ([place])   . Elevated PSA   . Hx of radiation therapy 09/25/13- 11/27/13   prostate 7800 cGy 39 sessions  . Hyperlipidemia   . Prostate cancer ([place]) 02/27/13   gleason 6, volume 133 cc  . Prostatitis   . Vitamin D deficiency    Social History   Socioeconomic History  . Marital status: Married    Spouse name: Not on file  . Number of children: 3  . Years of education: Not on file  . Highest education level: Not on file  Occupational History  . Not on file  Social Needs  . Financial resource strain: Not on file  . Food insecurity    Worry: Not on file    Inability: Not on file  . Transportation needs    Medical: Not on file    Non-medical: Not on file  Tobacco Use  . Smoking status: Current Every Day Smoker    Packs/day: 1.00    Years: 57.00    Pack years: 57.00    Last attempt to quit: 11/22/2012    Years since quitting: 6.8  . Smokeless tobacco: Never Used  Substance and Sexual Activity  . Alcohol use: Never    Frequency: Never  . Drug use: Yes    Types: Marijuana  . Sexual activity: Not on file  Lifestyle  . Physical activity    Days per week: Not on file    Minutes per session: Not on file  . Stress: Not on file  Relationships  . Social Herbalist on phone: Not on file    Gets together: Not on file    Attends religious service: Not on file    Active member of club or organization: Not on file    Attends meetings of clubs or organizations: Not on file    Relationship status: Not on file  Other Topics Concern  . Not on file  Social History Narrative  . Not on file   Family History  Problem Relation Age of Onset  . Prostate cancer Other    Scheduled Meds: . Chlorhexidine Gluconate Cloth  6 each Topical Daily  . enoxaparin (LOVENOX) injection  40 mg  Subcutaneous Q24H  . insulin aspart  0-9 Units Subcutaneous Q4H  . metoCLOPramide (REGLAN) injection  10 mg Intravenous Q8H   Continuous Infusions: . aztreonam  2 g (09/27/19 0755)  . dextrose 5 % and 0.45% NaCl 75 mL/hr at 09/27/19 0623  . metronidazole 500 mg (09/27/19 0620)  . potassium chloride 10 mEq (09/27/19 1133)  . TPN ADULT (ION) 50 mL/hr at 09/27/19 0300  . TPN ADULT (ION)     PRN Meds:.acetaminophen **OR** acetaminophen, morphine injection, ondansetron **OR** ondansetron (ZOFRAN) IV, sodium chloride flush Allergies  Allergen Reactions  . Penicillins     Did it involve swelling of the face/tongue/throat, SOB, or low BP? n/a Did it involve sudden or severe rash/hives, skin peeling, or any reaction on the inside of your mouth or nose? n/a Did you need to seek medical attention at a hospital or doctor's office? n/a When did it last happen?[id number] If all above answers are "NO", may proceed with cephalosporin use.   Review of Systems No dyspnea, no pain.  Complained of abdominal distension, weight loss, and weakness.  Physical Exam  Thin, frail, chronically ill appearing male, awake, alert, coherent CV rrr resp no distress Abdomen distended, quiet, n/g draining green bilious fluid.  Vital Signs: BP 121/82 (BP Location: Left Arm)   Pulse 94   Temp 98.2 F (36.8 C) (Oral)   Resp 16   Ht '5\' 8"'$  (1.727 m)   Wt 70.3 kg   SpO2 100%   BMI 23.57 kg/m  Pain Scale: 0-10 POSS *See Group Information*: S-Acceptable,Sleep, easy to arouse Pain Score: 0-No pain   SpO2: SpO2: 100 % O2 Device:SpO2: 100 % O2 Flow Rate: .   IO: Intake/output summary:   Intake/Output Summary (Last 24 hours) at 09/27/2019 1255 Last data filed at 09/27/2019 0755 Gross per 24 hour  Intake 1998.51 ml  Output 3250 ml  Net -1251.49 ml    LBM: Last BM Date: 09/26/19 Baseline Weight: Weight: 70.3 kg Most recent weight: Weight: 70.3 kg     Palliative Assessment/Data: 40%     Time In:  11:00 Time Out: 12:10 Time Total: 70 min Visit consisted of counseling and education dealing with the complex and emotionally intense issues surrounding the need for palliative care and symptom management in the setting of serious and potentially life-threatening illness. Greater than 50%  of this time was spent counseling and coordinating care related to the above assessment and plan.  Signed by: Florentina Jenny, PA-C Palliative Medicine Pager: [phone]  Please contact Palliative Medicine Team phone at [phone] for questions and concerns.  For individual provider: See Shea Evans

## 2019-09-27 NOTE — Progress Notes (Signed)
[facility]/Trauma Progress Note  4 Days Post-Op   Assessment/Plan History of prostate cancer Anemia chronic disease AKI Above per medicine  Large bowel obstruction due to possible colorectal cancerversus diverticulitis POD2 S/Pex lap, diverting loop descending colostomy - Dr. Ninfa Linden 10/4 -Twice daily wet-to-dry dressing changes to midline,continue JP drain Postop ileus -NG tubeto LIWS,copious bilious output -Some gasgas in ostomyand stool - AXR 10/07 showed ileus -TPN started on 10/07 2/2 ileus and n.p.o. status since 10/1 Liver mass - unable to biopsy intraoperatively  - CT guided liver biopsy by IR once pt decompressed  FEN:N.p.o., NG tube to LIWS,ice chips okay,TPN VTE: SCD's, lovenox [id number] 10/5>>, WBC 1.31, continue to monitor CBC Foley:yes 2/2 urinary retention Follow up:Dr. Ninfa Linden  DISPO:TPN, await return of bowel function, ambulate. AXR pending this am. Pt needs to ambulate.    LOS: 8 days    Subjective: CC: no complaints  Pt is resting but he has not complaints. He denies abdominal pain.  Objective: Vital signs in last 24 hours: Temp:  [98.2 F (36.8 C)] 98.2 F (36.8 C) (10/09 0425) Pulse Rate:  [91-94] 94 (10/09 0425) Resp:  [16-20] 16 (10/09 0425) BP: (112-121)/(81-90) 121/82 (10/09 0425) SpO2:  [98 %-100 %] 100 % (10/09 0425) Last BM Date: 09/26/19  Intake/Output from previous day: 10/08 0701 - 10/09 0700 In: 1998.5 [I.V.:1498.5; IV Piggyback:500] Out: [id number]; Emesis/NG output:2940; Drains:435; Stool:600] Intake/Output this shift: No intake/output data recorded.  PE: Gen: Alert, NAD, sleepy this am HEENT: NG tube in place with dark bilious output Pulm:Rate andeffort normal Abd: Soft,more distended of the epigastric region today,+BS,JP drain with serous output, midline incision is clean and without purulent drainage, stoma is large and red, copious gas, small amt of stool and  serosanguineous fluid in ostomy. no TTP. No peritonitis Skin: no rashes noted, warm and dry   Anti-infectives: Anti-infectives (From admission, onward)   Start     Dose/Rate Route Frequency Ordered Stop   09/25/19 1600  aztreonam (AZACTAM) 2 g in sodium chloride 0.9 % 100 mL IVPB     2 g 200 mL/hr over 30 Minutes Intravenous Every 8 hours 09/25/19 0938     09/20/19 0800  aztreonam (AZACTAM) 1 g in sodium chloride 0.9 % 100 mL IVPB  Status:  Discontinued     1 g 200 mL/hr over 30 Minutes Intravenous Every 8 hours 09/19/19 2246 09/25/19 0938   09/20/19 0700  metroNIDAZOLE (FLAGYL) IVPB 500 mg     500 mg 100 mL/hr over 60 Minutes Intravenous Every 8 hours 09/19/19 2310     09/19/19 2245  aztreonam (AZACTAM) 2 g in sodium chloride 0.9 % 100 mL IVPB     2 g 200 mL/hr over 30 Minutes Intravenous  Once 09/19/19 2236 09/19/19 2332   09/19/19 2245  metroNIDAZOLE (FLAGYL) IVPB 500 mg     500 mg 100 mL/hr over 60 Minutes Intravenous  Once 09/19/19 2236 09/20/19 0110      Lab Results:  Recent Labs    09/25/19 0203 09/26/19 0417  WBC 12.0* 13.1*  HGB 12.9* 12.8*  HCT 39.5 39.5  PLT 413* 416*   BMET Recent Labs    09/26/19 0417 09/27/19 0429  NA 153* 152*  K 3.5 3.1*  CL 114* 115*  CO2 28 29  GLUCOSE 157* 141*  BUN 23 17  CREATININE 0.93 0.79  CALCIUM 8.4* 7.9*   PT/INR No results for input(s): LABPROT, INR in the last 72 hours. CMP     Component Value  Date/Time   NA 152 (H) 09/27/2019 0429   NA 143 12/27/2012   K 3.1 (L) 09/27/2019 0429   CL 115 (H) 09/27/2019 0429   CO2 29 09/27/2019 0429   GLUCOSE 141 (H) 09/27/2019 0429   BUN 17 09/27/2019 0429   BUN 9 12/27/2012   CREATININE 0.79 09/27/2019 0429   CALCIUM 7.9 (L) 09/27/2019 0429   PROT 5.3 (L) 09/26/2019 0417   ALBUMIN 1.8 (L) 09/26/2019 0417   AST 29 09/26/2019 0417   ALT 19 09/26/2019 0417   ALKPHOS 57 09/26/2019 0417   BILITOT 0.5 09/26/2019 0417   GFRNONAA >60 09/27/2019 0429   GFRAA >60 09/27/2019  0429   Lipase     Component Value Date/Time   LIPASE 21 09/19/2019 1658    Studies/Results: Dg Abd Portable 1v  Result Date: 09/25/2019 CLINICAL DATA:  Acute onset abdominal distension today. EXAM: PORTABLE ABDOMEN - 1 VIEW COMPARISON:  Single-view of the abdomen 09/23/2019 and 09/22/2019. FINDINGS: NG tube remains in place in good position. Diffuse gaseous distention of small and large bowel persists. Large colonic stool burden is again seen. Tube projecting over the pelvis with its tip over the left ilium is consistent with a surgical drain. IMPRESSION: Diffuse gaseous distention of small and large bowel compatible with ileus. Large colonic stool burden noted. Electronically Signed   By: Inge Rise M.D.   On: 09/25/2019 11:02   [place] Ekg Site Rite  Result Date: 09/25/2019 If Site Rite image not attached, placement could not be confirmed due to current cardiac rhythm.    Kalman Drape, [facility] Surgery Pager [phone] Cristine Polio, & Friday 7:00am - 4:30pm Thursdays 7:00am -11:30am

## 2019-09-27 NOTE — Consult Note (Signed)
[place] Nurse ostomy follow up Patient receiving care in [place]. Stoma type/location: LUQ colostomy Stomal assessment/size: edematous, very large, support rod in place. Pouch changed 09/26/19 and intact without leakage.  Therefore, not changed supply kit in room.  Will request additional 4 inch kits.  Patient not out of bed walking yet, still with NGT to suction, NPO. Peristomal assessment: deferred Treatment options for stomal/peristomal skin: barrier ring Output:  Liquid green in pouch  Ostomy pouching: 2pc. 4 inch Education provided: none.  Patient not ready to be taught. Enrolled patient in [place] Discharge program: No I am hopeful the size of the stoma will decrease and we will be able to place in into a 2 3/4 inch system prior to discharge. Val Riles, RN, MSN, CWOCN, CNS-BC, pager [phone]

## 2019-09-27 NOTE — Progress Notes (Signed)
PHARMACY - ADULT TOTAL PARENTERAL NUTRITION CONSULT NOTE   Pharmacy Consult for TPN Indication: prolonged ileus and NPO status  Patient Measurements: Height: 5\' 8"  (172.7 cm) Weight: 155 lb (70.3 kg) IBW/kg (Calculated) : 68.4 TPN AdjBW (KG): 70.3 Body mass index is 23.57 kg/m.  Assessment:  78 year old male presented to the hospital with abdominal pain. Found to have a large bowel obstruction due to possible colorectal cancer vs diverticulitis. S/p ex lap with diverting loop descending colostomy on 10/5. Pt now with ileus and prolonged NPO status since 10/1 so will begin TPN while awaiting return of bowel function.  GI: NG clamped 10/6 but with emesis so back to LIWS - NG output 2740cc; prealbumin 9.7, TG 72, LBM 10/8 Endo: No hx DM - CBGs remains well controlled on SSI Insulin requirements in the past 24 hours: 5 Lytes: Na elevated at 152, K 3.1 (goal >/= 4), Mg 1.9 (goal >/= 2), Phos 3, CoCa 9.7 Renal: [place] 0.79, UOP 0.9 mg/kg/hr - MD adjusted fluids Pulm: RA Cards: VSS Hepatobil: LFTs + Tbili WNL Neuro: A&Ox4, prn morphine, prn APAP ID: Aztreonam + flagyl for IAI - Afebrile, WBC 13.1  TPN Access: PICC placed 10/7 TPN start date: 10/7 Nutritional Goals (per RD recommendation on 10/8): KCal: 1800-2000   Protein: 90-105g Fluid: >1.8L  Goal TPN rate is 75 ml/hr (provides 99g protein, 2038 KCal)  Current Nutrition:  TPN  Plan:  Increase TPN to goal rate of 55mL/hr  This TPN provides 99g of protein, 324g of dextrose, and 54g of lipids which provides 2038 kCals per day, meeting 100% of patient needs Electrolytes in TPN: continue without sodium, other electrolytes will increase with increasing rate Add MVI, trace elements to TPN Continue SSI for now - may be able to discontinue if CBGs remain well controlled once TPN is at goal rate MIVF per MD - MD increased D51/2NS back up to 85ml/hr, will monitor output closely and need for eventual rate reduction KCl 19meq IV Q1H x 5  Mg  2gm IV x 1 Monitor TPN labs and signs of refeeding  Chris Narasimhan, Rande Lawman 09/27/2019,7:25 AM

## 2019-09-27 NOTE — Care Management Important Message (Signed)
Important Message  Patient Details  Name: Charles Espinoza MRN: [id number] Date of Birth: May 08, 1941   Medicare Important Message Given:  Yes     Shelda Altes 09/27/2019, 1:47 PM

## 2019-09-27 NOTE — Progress Notes (Signed)
The patient is very upset with his situation and states he wanted to go home and wants to remove everything out. This RN calmed pt down and explained the importance of keeping everything in place. Pt wants to see the doctor, and this RN explained the doctor rounds in the morning. Pt verbalized, "I don't even have the chance to talk to him, he's here then he's gone, I don't know my prognosis, I just want to go home."

## 2019-09-27 NOTE — Progress Notes (Signed)
PROGRESS NOTE    Charles Espinoza  [id number] DOB: May 17, 1941 DOA: 09/19/2019 PCP: Rogers Blocker, MD   Brief Narrative:  Male with prior history of prostate cancer s/p radiation in January 2015, COPD presents to ED with generalized abdominal pain for 1 month associated with abdominal distention and watery bowel movements.  CT of the abdomen and pelvis shows volvulus and complex sigmoid collection.  Patient was started on broad-spectrum IV antibiotics and general surgery consulted.  Patient underwent exploratory laparotomy with diverting colostomy on 09/23/2019.  Patient seen and examined today he continues to have abdominal discomfort reports he is not feeling too well continues to have NG tube connected to low intermittent suction. Palliative care consulted for goals of care discussion.   Assessment & Plan:   Principal Problem:   Colonic mass Active Problems:   Malignant neoplasm of prostate (HCC)   Liver mass   Mass of colon   Abdominal distension   Protein-calorie malnutrition, severe   Colon obstruction [facility])   Palliative care encounter   Large bowel obstruction secondary to perforated rectal cancer status post exploratory laparotomy with diverting colostomy on 09/22/2019.  Patient continues to have postoperative ileus for which an NG tube was placed and it is connected to intermittent suction continues to have significant bilious output.  Patient has colostomy bag with very little amount of stool and gas.  Appreciate surgery recommendations. .  Patient has completed about 8 days of IV antibiotics.  He remains afebrile.  Repeat CBC in a.m. to check WBC count.  Patient continues to be n.p.o. except for ice chips and were currently waiting for bowel function to resume.  Abdominal x-ray showed persistent postop ileus without signs of obstruction.   Recommend ambulating the patient and getting the patient out of bed to chair.   Liver mass  MRI of the abdomen shows right  hepatic lesion favoring metastatic lesion from the colon cancer. He will probably need CT-guided liver biopsy by IR once the colon is decompressed.  He will also need follow-up with oncology on discharge.   Anemia of chronic disease Hemoglobin appears to be stable around 12.   History of prostate cancer  Status post XRT in 2015   Acute Kidney injury probably secondary to prerenal/dehydration I improved with fluids and repeat renal parameters within normal limits. Hypernatremia Probable secondary to free water deficit, improving increase dextrose fluids to 75 ml/hr.   Hypophosphatemia:  Due to severe protein calorie malnutrition.  On TPN repeat phosphorus levels around 3.   Nutrition patient is on TPN at this time.  Acute urinary retention:  Probably secondary to ileus Foley catheter placed.   Hypokalemia Replaced.  Repeat potassium in the morning.  DVT prophylaxis: Lovenox  code Status: Full code Family Communication: None and at bedside. Discussed the plan with his wife over the phone.  Disposition Plan: Pending clinical improvement and resumption of bowel function and palliative care consult.    Consultants:   General surgery  Gastroenterology.  Palliative care consult.   Procedures: S/Pex lap, diverting loop descending colostomy - Dr. Ninfa Linden 10/4  Antimicrobials: aztreonam.  And Flagyl since admission.  Completed 8 days of IV antibiotics.  Subjective: Patient appears uncomfortable reports abdominal discomfort, no nausea or vomiting, no chest pain or shortness of breath.  Objective: Vitals:   09/26/19 1525 09/26/19 2213 09/27/19 0425 09/27/19 1614  BP: 112/90 120/81 121/82 109/79  Pulse: 91 91 94 (!) 101  Resp: 18 20 16 20   Temp: 98.2 F (36.8 C)  98.2 F (36.8 C) 98.2 F (36.8 C) 98.2 F (36.8 C)  TempSrc: Oral Oral Oral Oral  SpO2: 98% 100% 100% 99%  Weight:      Height:        Intake/Output Summary (Last 24 hours) at 09/27/2019 1759 Last data  filed at 09/27/2019 1615 Gross per 24 hour  Intake 1998.51 ml  Output 3970 ml  Net -1971.49 ml   Filed Weights   09/19/19 2200 09/23/19 0850  Weight: 70.3 kg 70.3 kg    Examination:  General exam: Cachectic looking gentleman with abdominal distention Respiratory system: Diminished air entry at bases, no wheezing heard Cardiovascular system: S1-S2 heard, regular rate rhythm Gastrointestinal system: Abdomen is soft, distended mild generalized tenderness with JP drain in place.  Colostomy bag with small amount of stool.   Central nervous system: Alert and answering questions appropriately Extremities: No pedal edema Skin: Has colostomy bag, no rashes or ulcers seen Psychiatry: Mood is appropriate    Data Reviewed: I have personally reviewed following labs and imaging studies  CBC: Recent Labs  Lab 09/22/19 0411 09/23/19 0410 09/24/19 0431 09/25/19 0203 09/26/19 0417  WBC 17.0* 13.9* 12.7* 12.0* 13.1*  NEUTROABS 15.3* 12.3* 11.5* 10.6* 11.4*  HGB 11.0* 10.9* 12.7* 12.9* 12.8*  HCT 32.9* 32.6* 40.0 39.5 39.5  MCV 92.9 94.2 95.7 95.6 96.3  PLT 385 385 466* 413* 123456*   Basic Metabolic Panel: Recent Labs  Lab 09/23/19 0410 09/24/19 0431 09/25/19 0203 09/26/19 0417 09/27/19 0429  NA 147* 151* 150* 153* 152*  K 3.3* 3.7 3.9 3.5 3.1*  CL 115* 115* 118* 114* 115*  CO2 21* 23 23 28 29   GLUCOSE 93 115* 145* 157* 141*  BUN 31* 26* 24* 23 17  CREATININE 1.33* 1.23 0.92 0.93 0.79  CALCIUM 8.8* 8.1* 8.0* 8.4* 7.9*  MG  --   --   --  1.9 1.9  PHOS  --   --   --  2.3* 3.0   GFR: Estimated Creatinine Clearance: 71.3 mL/min (by C-G formula based on SCr of 0.79 mg/dL). Liver Function Tests: Recent Labs  Lab 09/26/19 0417  AST 29  ALT 19  ALKPHOS 57  BILITOT 0.5  PROT 5.3*  ALBUMIN 1.8*   No results for input(s): LIPASE, AMYLASE in the last 168 hours. No results for input(s): AMMONIA in the last 168 hours. Coagulation Profile: No results for input(s): INR, PROTIME  in the last 168 hours. Cardiac Enzymes: No results for input(s): CKTOTAL, CKMB, CKMBINDEX, TROPONINI in the last 168 hours. BNP (last 3 results) No results for input(s): PROBNP in the last 8760 hours. HbA1C: No results for input(s): HGBA1C in the last 72 hours. CBG: Recent Labs  Lab 09/26/19 2338 09/27/19 0426 09/27/19 0859 09/27/19 1304 09/27/19 1639  GLUCAP 115* 144* 130* 154* 99   Lipid Profile: Recent Labs    09/26/19 0417  TRIG 72   Thyroid Function Tests: No results for input(s): TSH, T4TOTAL, FREET4, T3FREE, THYROIDAB in the last 72 hours. Anemia Panel: No results for input(s): VITAMINB12, FOLATE, FERRITIN, TIBC, IRON, RETICCTPCT in the last 72 hours. Sepsis Labs: No results for input(s): PROCALCITON, LATICACIDVEN in the last 168 hours.  Recent Results (from the past 240 hour(s))  SARS Coronavirus 2 [facility] order, Performed in [facility] hospital lab) Nasopharyngeal Nasopharyngeal Swab     Status: None   Collection Time: 09/19/19  9:43 PM   Specimen: Nasopharyngeal Swab  Result Value Ref Range Status   SARS Coronavirus 2 NEGATIVE NEGATIVE Final  Comment: (NOTE) If result is NEGATIVE SARS-CoV-2 target nucleic acids are NOT DETECTED. The SARS-CoV-2 RNA is generally detectable in upper and lower  respiratory specimens during the acute phase of infection. The lowest  concentration of SARS-CoV-2 viral copies this assay can detect is 250  copies / mL. A negative result does not preclude SARS-CoV-2 infection  and should not be used as the sole basis for treatment or other  patient management decisions.  A negative result may occur with  improper specimen collection / handling, submission of specimen other  than nasopharyngeal swab, presence of viral mutation(s) within the  areas targeted by this assay, and inadequate number of viral copies  (<250 copies / mL). A negative result must be combined with clinical  observations, patient history, and epidemiological  information. If result is POSITIVE SARS-CoV-2 target nucleic acids are DETECTED. The SARS-CoV-2 RNA is generally detectable in upper and lower  respiratory specimens dur ing the acute phase of infection.  Positive  results are indicative of active infection with SARS-CoV-2.  Clinical  correlation with patient history and other diagnostic information is  necessary to determine patient infection status.  Positive results do  not rule out bacterial infection or co-infection with other viruses. If result is PRESUMPTIVE POSTIVE SARS-CoV-2 nucleic acids MAY BE PRESENT.   A presumptive positive result was obtained on the submitted specimen  and confirmed on repeat testing.  While 2019 novel coronavirus  (SARS-CoV-2) nucleic acids may be present in the submitted sample  additional confirmatory testing may be necessary for epidemiological  and / or clinical management purposes  to differentiate between  SARS-CoV-2 and other Sarbecovirus currently known to infect humans.  If clinically indicated additional testing with an alternate test  methodology [phone]) is advised. The SARS-CoV-2 RNA is generally  detectable in upper and lower respiratory sp ecimens during the acute  phase of infection. The expected result is Negative. Fact Sheet for Patients:  StrictlyIdeas.no Fact Sheet for Healthcare Providers: BankingDealers.co.za This test is not yet approved or cleared by the [place] FDA and has been authorized for detection and/or diagnosis of SARS-CoV-2 by FDA under an Emergency Use Authorization (EUA).  This EUA will remain in effect (meaning this test can be used) for the duration of the COVID-19 declaration under Section 564(b)(1) of the Act, 21 U.S.C. section 360bbb-3(b)(1), unless the authorization is terminated or revoked sooner. Performed at [facility], [place] [address] [place], [place] [address]   MRSA PCR Screening      Status: None   Collection Time: 09/20/19  1:25 AM   Specimen: Nasopharyngeal  Result Value Ref Range Status   MRSA by PCR NEGATIVE NEGATIVE Final    Comment:        The GeneXpert MRSA Assay (FDA approved for NASAL specimens only), is one component of a comprehensive MRSA colonization surveillance program. It is not intended to diagnose MRSA infection nor to guide or monitor treatment for MRSA infections. Performed at [facility], [place] [address] [place], [place] [address]          Radiology Studies: Dg Abd 2 Views  Result Date: 09/27/2019 CLINICAL DATA:  Follow-up bowel obstruction. EXAM: ABDOMEN - 2 VIEW COMPARISON:  Radiograph of September 25, 2019. FINDINGS: Nasogastric tube tip is seen in proximal stomach. Stable diffuse gaseous dilatation of large and small bowel is noted. Surgical drain is noted in the pelvis. Phleboliths are noted in the pelvis. No definite pneumoperitoneum is noted. IMPRESSION: Grossly stable large and  small bowel dilatation is noted concerning for ileus or less likely obstruction. Electronically Signed   By: Marijo Conception M.D.   On: 09/27/2019 11:31        Scheduled Meds: . Chlorhexidine Gluconate Cloth  6 each Topical Daily  . enoxaparin (LOVENOX) injection  40 mg Subcutaneous Q24H  . insulin aspart  0-9 Units Subcutaneous Q4H  . metoCLOPramide (REGLAN) injection  10 mg Intravenous Q8H   Continuous Infusions: . aztreonam 2 g (09/27/19 1526)  . dextrose 5 % and 0.45% NaCl 75 mL/hr at 09/27/19 0623  . metronidazole 500 mg (09/27/19 1405)  . TPN ADULT (ION) 75 mL/hr at 09/27/19 1749     LOS: 8 days       Hosie Poisson, MD [facility] Pager [phone]   If 7PM-7AM, please contact night-coverage [email or web address] Password [email or web address] 09/27/2019, 5:59 PM

## 2019-09-27 NOTE — Progress Notes (Signed)
Physical Therapy Treatment Patient Details Name: Charles Espinoza MRN: [id number] DOB: 08-30-1941 Today's Date: 09/27/2019    History of Present Illness 78yo male c/o general abdominal pain x1 month, abdominal distension, loose stools. CT shows sigmoid abscess vs malignant ulceration and hepatic lesion consistent with malignancy. Received exploratory laparotomy and diverting loop descending colostomy 09/23/19. PMH prostate CA with radiation treatments, COPD, HLD, prostate biopsy    PT Comments    Patient agreeable to PT, visitor present initially. Patient performed bed mobility with increased time and effort, use of rails but needs no physical assist. Transfers sit to stand with min guard/min assist. Patient was able to ambulate from bed to window x 4 laps, min assist for balance and to manage lines. 2 seated rests needed during session.  Patient is slightly unsteady. He will benefit from continued skilled PT to improve strength and safety with mobility.     Follow Up Recommendations  Home health PT;Supervision for mobility/OOB     Equipment Recommendations  Rolling walker with 5" wheels;3in1 (PT)    Recommendations for Other Services       Precautions / Restrictions Precautions Precautions: Fall Precaution Comments: JP drain, abdominal incision, NG tube, foley cath Restrictions Weight Bearing Restrictions: No    Mobility  Bed Mobility Overal bed mobility: Modified Independent;Needs Assistance Bed Mobility: Supine to Sit     Supine to sit: Supervision     General bed mobility comments: use of bed rail, increased time, assist to manage lines, no physical assistance needed  Transfers Overall transfer level: Needs assistance Equipment used: None Transfers: Sit to/from Stand Sit to Stand: Min assist         General transfer comment: minA for steadying  Ambulation/Gait Ambulation/Gait assistance: Min assist Gait Distance (Feet): 50 Feet Assistive device: None Gait  Pattern/deviations: Step-through pattern;Drifts right/left Gait velocity: decreased   General Gait Details: Patient ambulated to window and back to bed x 3-4 reps, seated rest on bed x 2. Min assist for balance and to manage lines   Stairs             Wheelchair Mobility    Modified Rankin (Stroke Patients Only)       Balance Overall balance assessment: Needs assistance Sitting-balance support: Feet supported Sitting balance-Leahy Scale: Good     Standing balance support: No upper extremity supported Standing balance-Leahy Scale: Fair Standing balance comment: reliant on min assist to maintain safety                            Cognition Arousal/Alertness: Awake/alert Behavior During Therapy: WFL for tasks assessed/performed Overall Cognitive Status: Within Functional Limits for tasks assessed                                        Exercises      General Comments        Pertinent Vitals/Pain Pain Assessment: Faces Faces Pain Scale: Hurts a little bit Pain Location: abdomen Pain Descriptors / Indicators: Grimacing;Guarding;Sore Pain Intervention(s): Monitored during session;Repositioned    Home Living                      Prior Function            PT Goals (current goals can now be found in the care plan section) Acute Rehab PT Goals Patient Stated Goal: feel  better, get warm PT Goal Formulation: With patient Time For Goal Achievement: 10/09/19 Potential to Achieve Goals: Good Progress towards PT goals: Progressing toward goals    Frequency    Min 3X/week      PT Plan Current plan remains appropriate    Co-evaluation              AM-PAC PT "6 Clicks" Mobility   Outcome Measure  Help needed turning from your back to your side while in a flat bed without using bedrails?: None Help needed moving from lying on your back to sitting on the side of a flat bed without using bedrails?: A Little Help  needed moving to and from a bed to a chair (including a wheelchair)?: A Little Help needed standing up from a chair using your arms (e.g., wheelchair or bedside chair)?: A Little Help needed to walk in hospital room?: A Little Help needed climbing 3-5 steps with a railing? : A Little 6 Click Score: 19    End of Session Equipment Utilized During Treatment: Gait belt Activity Tolerance: Patient tolerated treatment well Patient left: in chair;with call bell/phone within reach Nurse Communication: Mobility status PT Visit Diagnosis: Unsteadiness on feet (R26.81);Muscle weakness (generalized) (M62.81);Difficulty in walking, not elsewhere classified (R26.2)     Time: [id number] PT Time Calculation (min) (ACUTE ONLY): 30 min  Charges:  $Gait Training: 23-37 mins                     Mililani Murthy, PT, GCS 09/27/19,1:47 PM

## 2019-09-28 DIAGNOSIS — Z515 Encounter for palliative care: Secondary | ICD-10-CM

## 2019-09-28 DIAGNOSIS — E43 Unspecified severe protein-calorie malnutrition: Secondary | ICD-10-CM

## 2019-09-28 LAB — CBC
HCT: 36.5 % — ABNORMAL LOW (ref 39.0–52.0)
Hemoglobin: 11.6 g/dL — ABNORMAL LOW (ref 13.0–17.0)
MCH: 31 pg (ref 26.0–34.0)
MCHC: 31.8 g/dL (ref 30.0–36.0)
MCV: 97.6 fL (ref 80.0–100.0)
Platelets: 306 10*3/uL (ref 150–400)
RBC: 3.74 MIL/uL — ABNORMAL LOW (ref 4.22–5.81)
RDW: 14 % (ref 11.5–15.5)
WBC: 12.2 10*3/uL — ABNORMAL HIGH (ref 4.0–10.5)
nRBC: 0 % (ref 0.0–0.2)

## 2019-09-28 LAB — MAGNESIUM: Magnesium: 2 mg/dL (ref 1.7–2.4)

## 2019-09-28 LAB — GLUCOSE, CAPILLARY
Glucose-Capillary: 117 mg/dL — ABNORMAL HIGH (ref 70–99)
Glucose-Capillary: 121 mg/dL — ABNORMAL HIGH (ref 70–99)
Glucose-Capillary: 123 mg/dL — ABNORMAL HIGH (ref 70–99)
Glucose-Capillary: 124 mg/dL — ABNORMAL HIGH (ref 70–99)
Glucose-Capillary: 126 mg/dL — ABNORMAL HIGH (ref 70–99)
Glucose-Capillary: 129 mg/dL — ABNORMAL HIGH (ref 70–99)
Glucose-Capillary: 132 mg/dL — ABNORMAL HIGH (ref 70–99)

## 2019-09-28 LAB — BASIC METABOLIC PANEL
Anion gap: 9 (ref 5–15)
BUN: 18 mg/dL (ref 8–23)
CO2: 27 mmol/L (ref 22–32)
Calcium: 8 mg/dL — ABNORMAL LOW (ref 8.9–10.3)
Chloride: 114 mmol/L — ABNORMAL HIGH (ref 98–111)
Creatinine, Ser: 0.72 mg/dL (ref 0.61–1.24)
GFR calc Af Amer: >60 mL/min (ref >60)
GFR calc non Af Amer: >60 mL/min (ref >60)
Glucose, Bld: 127 mg/dL — ABNORMAL HIGH (ref 70–99)
Potassium: 3.4 mmol/L — ABNORMAL LOW (ref 3.5–5.1)
Sodium: 150 mmol/L — ABNORMAL HIGH (ref 135–145)

## 2019-09-28 LAB — PHOSPHORUS: Phosphorus: 2.5 mg/dL (ref 2.5–4.6)

## 2019-09-28 MED ORDER — PHENOL 1.4 % MT LIQD
1.0000 | OROMUCOSAL | Status: DC | PRN
  Administered 2019-09-28: 17:00:00 1 via OROMUCOSAL
  Filled 2019-09-28: qty 177

## 2019-09-28 MED ORDER — POTASSIUM CHLORIDE 10 MEQ/50ML IV SOLN
10.0000 meq | INTRAVENOUS | Status: AC
  Administered 2019-09-28 (×5): 10 meq via INTRAVENOUS
  Filled 2019-09-28 (×5): qty 50

## 2019-09-28 MED ORDER — TRAVASOL 10 % IV SOLN
INTRAVENOUS | Status: AC
  Administered 2019-09-28: 18:00:00 via INTRAVENOUS
  Filled 2019-09-28: qty 990

## 2019-09-28 NOTE — Progress Notes (Signed)
PHARMACY - ADULT TOTAL PARENTERAL NUTRITION CONSULT NOTE   Pharmacy Consult for TPN Indication: prolonged ileus and NPO status  Patient Measurements: Height: 5\' 8"  (172.7 cm) Weight: 155 lb (70.3 kg) IBW/kg (Calculated) : 68.4 TPN AdjBW (KG): 70.3 Body mass index is 23.57 kg/m.  Assessment:  78 year old male presented to the hospital with abdominal pain. Found to have a large bowel obstruction due to possible colorectal cancer vs diverticulitis. S/p ex lap with diverting loop descending colostomy on 10/5. Pt now with ileus and prolonged NPO status since 10/1 so will begin TPN while awaiting return of bowel function.  GI: NG clamped 10/6 but with emesis so back to LIWS - NG output 1200 ml; ostomy with small brown output, LBM 10/9; prealbumin 9.7,  Endo: No hx DM - CBGs remains well controlled on SSI Insulin requirements in the past 24 hours: 5 Lytes: Na elevated at 150, K 3.4 (goal >/= 4), Mg 2 (goal >/= 2), Phos 2.5, ~CoCa 9.7 Renal: [place] 0.72, UOP 0.5 mg/kg/hr - MD adjusting fluids, net -0.37L Pulm: RA Cards: VSS Hepatobil: LFTs + Tbili WNL, TG 72 Neuro: A&Ox4, prn morphine, prn APAP ID: s/p aztreonam + flagyl for IAI - Afebrile, WBC 12.2  TPN Access: PICC placed 10/7 TPN start date: 10/7 Nutritional Goals (per RD recommendation on 10/8): KCal: 1800-2000   Protein: 90-105g Fluid: >1.8L  Goal TPN rate is 75 ml/hr (provides 99g protein, 2038 KCal)  Current Nutrition:  TPN  Plan:  Increase TPN to goal rate of 14mL/hr  This TPN provides 99g of protein, 324g of dextrose, and 54g of lipids which provides 2038 kCals per day, meeting 100% of patient needs Electrolytes in TPN: continue without sodium, other electrolytes will increase with goal rate >24h, change to 1:2 [id number] Add MVI, trace elements to TPN Continue SSI for now - may be able to discontinue if CBGs remain well controlled once TPN is at goal rate for >24h MIVF per MD - MD increased D51/2NS back at 47ml/hr, will  monitor output closely and need for eventual rate reduction Monitor TPN labs and signs of refeeding  KCl 17meq IV Q1H x 5   Thank you for involving pharmacy in this patient's care.  Renold Genta, PharmD, BCPS Clinical Pharmacist Clinical phone for 09/28/2019 until 3p is [phone] 09/28/2019 7:20 AM  **Pharmacist phone directory can be found on [email or web address] listed under [place]**

## 2019-09-28 NOTE — Progress Notes (Signed)
Palliative follow up.  Chart reviewed.    Visited with patient as he is working with Engineer, manufacturing to get up and walk.  Both are doing an excellent job.  He walked right out of his room with a rolling walker.  Patient reports he has no stomach pain or nausea today and distension is decreased. He tells me he feels he is going to improve.  He has faith.  He appears (relatively) strong and determined.  PE:  Elderly thin, frail man, awake, alert, coherent, determined, NAD.  Walking well with RW and Tech.  Prognosis:  Difficult to predict at this point.  Hopefully his ileus is resolving and he will be able to eat soon.  If he is not a candidate for oncological treatment his prognosis is less than 6 months.  Recommendation:  Potassium appears to be difficult to replete.  Needs to be 4.5 - 5.0 given ileus.    Liver biopsy when appropriate.    Oncology consultation when appropriate.  Patient wants full code, full scope treatment for now.  Chaplain visit/prayer would be appreciated.  PMT will follow intermittently.  Florentina Jenny, PA-C Palliative Medicine Pager: [phone]  Time 25 min.  Greater than 50%  of this time was spent counseling and coordinating care related to the above assessment and plan

## 2019-09-28 NOTE — Progress Notes (Signed)
Patient ID: Charles Espinoza, male   DOB: 16-Apr-1941, 78 y.o.   MRN: [id number] [facility] Progress Note:   5 Days Post-Op  Subjective: Mental status is awake and appropriate Objective: Vital signs in last 24 hours: Temp:  [97.5 F (36.4 C)-98.7 F (37.1 C)] 97.5 F (36.4 C) (10/10 0453) Pulse Rate:  [96-101] 96 (10/10 0453) Resp:  [18-26] 19 (10/10 0453) BP: (98-112)/(64-87) 109/64 (10/10 0453) SpO2:  [97 %-100 %] 100 % (10/10 0453)  Intake/Output from previous day: 10/09 0701 - 10/10 0700 In: 3288 [I.V.:2638; IV Piggyback:650] Out: 2550 [Urine:900; Emesis/NG output:1000; Drains:650] Intake/Output this shift: No intake/output data recorded.  Physical Exam: Work of breathing is not labored;  No abdominal pain.  Ostomy with small brown output  Lab Results:  Results for orders placed or performed during the hospital encounter of 09/19/19 (from the past 48 hour(s))  Glucose, capillary     Status: Abnormal   Collection Time: 09/26/19 12:28 PM  Result Value Ref Range   Glucose-Capillary 135 (H) 70 - 99 mg/dL  Glucose, capillary     Status: Abnormal   Collection Time: 09/26/19  4:51 PM  Result Value Ref Range   Glucose-Capillary 109 (H) 70 - 99 mg/dL  Urinalysis, Routine w reflex microscopic     Status: Abnormal   Collection Time: 09/26/19  7:41 PM  Result Value Ref Range   Color, Urine YELLOW YELLOW   APPearance HAZY (A) CLEAR   Specific Gravity, Urine 1.026 1.005 - 1.030   pH 5.0 5.0 - 8.0   Glucose, UA NEGATIVE NEGATIVE mg/dL   Hgb urine dipstick LARGE (A) NEGATIVE   Bilirubin Urine NEGATIVE NEGATIVE   Ketones, ur NEGATIVE NEGATIVE mg/dL   Protein, ur 30 (A) NEGATIVE mg/dL   Nitrite NEGATIVE NEGATIVE   Leukocytes,Ua TRACE (A) NEGATIVE   RBC / HPF >50 (H) 0 - 5 RBC/hpf   WBC, UA 11-20 0 - 5 WBC/hpf   Bacteria, UA NONE SEEN NONE SEEN   Squamous Epithelial / LPF 0-5 0 - 5   Mucus PRESENT    Hyaline Casts, UA PRESENT     Comment: Performed at [facility], [address], [place], [place] [address]  Glucose, capillary     Status: Abnormal   Collection Time: 09/26/19  8:01 PM  Result Value Ref Range   Glucose-Capillary 128 (H) 70 - 99 mg/dL  Glucose, capillary     Status: Abnormal   Collection Time: 09/26/19 11:38 PM  Result Value Ref Range   Glucose-Capillary 115 (H) 70 - 99 mg/dL  Glucose, capillary     Status: Abnormal   Collection Time: 09/27/19  4:26 AM  Result Value Ref Range   Glucose-Capillary 144 (H) 70 - 99 mg/dL  Basic metabolic panel     Status: Abnormal   Collection Time: 09/27/19  4:29 AM  Result Value Ref Range   Sodium 152 (H) 135 - 145 mmol/L   Potassium 3.1 (L) 3.5 - 5.1 mmol/L   Chloride 115 (H) 98 - 111 mmol/L   CO2 29 22 - 32 mmol/L   Glucose, Bld 141 (H) 70 - 99 mg/dL   BUN 17 8 - 23 mg/dL   Creatinine, Ser 0.79 0.61 - 1.24 mg/dL   Calcium 7.9 (L) 8.9 - 10.3 mg/dL   GFR calc non Af Amer >60 >60 mL/min   GFR calc Af Amer >60 >60 mL/min   Anion gap 8 5 - 15    Comment: Performed at [facility], [place] [address] [place], [place] [address]  Magnesium     Status: None   Collection Time: 09/27/19  4:29 AM  Result Value Ref Range   Magnesium 1.9 1.7 - 2.4 mg/dL    Comment: Performed at [facility] [address], [place], [place] [address]  Phosphorus     Status: None   Collection Time: 09/27/19  4:29 AM  Result Value Ref Range   Phosphorus 3.0 2.5 - 4.6 mg/dL    Comment: Performed at [facility] [address], [place], [place] [address]  Glucose, capillary     Status: Abnormal   Collection Time: 09/27/19  8:59 AM  Result Value Ref Range   Glucose-Capillary 130 (H) 70 - 99 mg/dL  Glucose, capillary     Status: Abnormal   Collection Time: 09/27/19  1:04 PM  Result Value Ref Range   Glucose-Capillary 154 (H) 70 - 99 mg/dL  Glucose, capillary     Status: None   Collection Time: 09/27/19  4:39 PM  Result Value Ref Range   Glucose-Capillary 99 70 - 99 mg/dL  Glucose, capillary      Status: Abnormal   Collection Time: 09/27/19  8:23 PM  Result Value Ref Range   Glucose-Capillary 151 (H) 70 - 99 mg/dL  Glucose, capillary     Status: Abnormal   Collection Time: 09/27/19 11:53 PM  Result Value Ref Range   Glucose-Capillary 124 (H) 70 - 99 mg/dL   Comment 1 Notify RN    Comment 2 Document in Chart   Basic metabolic panel     Status: Abnormal   Collection Time: 09/28/19  3:48 AM  Result Value Ref Range   Sodium 150 (H) 135 - 145 mmol/L   Potassium 3.4 (L) 3.5 - 5.1 mmol/L   Chloride 114 (H) 98 - 111 mmol/L   CO2 27 22 - 32 mmol/L   Glucose, Bld 127 (H) 70 - 99 mg/dL   BUN 18 8 - 23 mg/dL   Creatinine, Ser 0.72 0.61 - 1.24 mg/dL   Calcium 8.0 (L) 8.9 - 10.3 mg/dL   GFR calc non Af Amer >60 >60 mL/min   GFR calc Af Amer >60 >60 mL/min   Anion gap 9 5 - 15    Comment: Performed at [facility], [place] [address], [place], [place] [address]  Magnesium     Status: None   Collection Time: 09/28/19  3:48 AM  Result Value Ref Range   Magnesium 2.0 1.7 - 2.4 mg/dL    Comment: Performed at [facility] [address], [place], [place] [address]  Phosphorus     Status: None   Collection Time: 09/28/19  3:48 AM  Result Value Ref Range   Phosphorus 2.5 2.5 - 4.6 mg/dL    Comment: Performed at [facility] [address] [place], [place] [address]  CBC     Status: Abnormal   Collection Time: 09/28/19  3:48 AM  Result Value Ref Range   WBC 12.2 (H) 4.0 - 10.5 K/uL   RBC 3.74 (L) 4.22 - 5.81 MIL/uL   Hemoglobin 11.6 (L) 13.0 - 17.0 g/dL   HCT 36.5 (L) 39.0 - 52.0 %   MCV 97.6 80.0 - 100.0 fL   MCH 31.0 26.0 - 34.0 pg   MCHC 31.8 30.0 - 36.0 g/dL   RDW 14.0 11.5 - 15.5 %   Platelets 306 150 - 400 K/uL   nRBC  0.0 0.0 - 0.2 %    Comment: Performed at [facility], [place] [address] [place], [place] [address]  Glucose, capillary     Status: Abnormal   Collection Time: 09/28/19  4:53 AM  Result Value Ref Range   Glucose-Capillary 132 (H) 70 - 99  mg/dL   Comment 1 Notify RN    Comment 2 Document in Chart   Glucose, capillary     Status: Abnormal   Collection Time: 09/28/19  7:55 AM  Result Value Ref Range   Glucose-Capillary 129 (H) 70 - 99 mg/dL    Radiology/Results: Dg Abd 2 Views  Result Date: 09/27/2019 CLINICAL DATA:  Follow-up bowel obstruction. EXAM: ABDOMEN - 2 VIEW COMPARISON:  Radiograph of September 25, 2019. FINDINGS: Nasogastric tube tip is seen in proximal stomach. Stable diffuse gaseous dilatation of large and small bowel is noted. Surgical drain is noted in the pelvis. Phleboliths are noted in the pelvis. No definite pneumoperitoneum is noted. IMPRESSION: Grossly stable large and small bowel dilatation is noted concerning for ileus or less likely obstruction. Electronically Signed   By: Marijo Conception M.D.   On: 09/27/2019 11:31    Anti-infectives: Anti-infectives (From admission, onward)   Start     Dose/Rate Route Frequency Ordered Stop   09/25/19 1600  aztreonam (AZACTAM) 2 g in sodium chloride 0.9 % 100 mL IVPB  Status:  Discontinued     2 g 200 mL/hr over 30 Minutes Intravenous Every 8 hours 09/25/19 0938 09/27/19 1900   09/20/19 0800  aztreonam (AZACTAM) 1 g in sodium chloride 0.9 % 100 mL IVPB  Status:  Discontinued     1 g 200 mL/hr over 30 Minutes Intravenous Every 8 hours 09/19/19 2246 09/25/19 0938   09/20/19 0700  metroNIDAZOLE (FLAGYL) IVPB 500 mg  Status:  Discontinued     500 mg 100 mL/hr over 60 Minutes Intravenous Every 8 hours 09/19/19 2310 09/27/19 1900   09/19/19 2245  aztreonam (AZACTAM) 2 g in sodium chloride 0.9 % 100 mL IVPB     2 g 200 mL/hr over 30 Minutes Intravenous  Once 09/19/19 2236 09/19/19 2332   09/19/19 2245  metroNIDAZOLE (FLAGYL) IVPB 500 mg     500 mg 100 mL/hr over 60 Minutes Intravenous  Once 09/19/19 2236 09/20/19 0110      Assessment/Plan: Problem List: Patient Active Problem List   Diagnosis Date Noted  . Colon obstruction ([facility])   . Palliative care encounter    . Protein-calorie malnutrition, severe 09/26/2019  . Abdominal distension   . Liver mass 09/19/2019  . Colonic mass 09/19/2019  . Mass of colon 09/19/2019  . Malignant neoplasm of prostate ([facility]) 08/15/2013    Perforated rectal cancer with mets;  Diverting colostomy 5 Days Post-Op    LOS: 9 days   Matt B. Hassell Done, MD, [facility] [phone] beeper [phone]  09/28/2019 7:59 AM

## 2019-09-28 NOTE — Progress Notes (Signed)
PROGRESS NOTE    Charles Espinoza  [id number] DOB: November 03, 1941 DOA: 09/19/2019 PCP: Rogers Blocker, MD   Brief Narrative:  Male with prior history of prostate cancer s/p radiation in January 2015, COPD presents to ED with generalized abdominal pain for 1 month associated with abdominal distention and watery bowel movements.  CT of the abdomen and pelvis shows volvulus and complex sigmoid collection.  Patient was started on broad-spectrum IV antibiotics and general surgery consulted.  Patient underwent exploratory laparotomy with diverting colostomy on 09/23/2019. Palliative care consulted for goals of care discussion.  Patient seen and examined today patient reports his nausea has improved and he does not have any abdominal pain this morning he work with physical therapy and his he reports his abdominal distention has improved when compared to yesterday.  He appears to be in good spirits.  Assessment & Plan:   Principal Problem:   Colonic mass Active Problems:   Malignant neoplasm of prostate ([facility])   Liver mass   Mass of colon   Abdominal distension   Protein-calorie malnutrition, severe   Colon obstruction [facility])   Palliative care encounter   Large bowel obstruction secondary to perforated rectal cancer status post exploratory laparotomy with diverting colostomy on 09/22/2019.  Patient continues to have postoperative ileus for which an NG tube was placed and it is connected to intermittent suction patient continues to have significant bilious output.   Patient has colostomy bag with very little amount of stool and gas.  Appreciate surgery recommendations. .  Patient has completed about 8 days of IV antibiotics.  He remains afebrile WBC count appears to be improving Patient continues to be n.p.o. except for ice chips and were currently waiting for bowel function to resume. Patient is ambulating and working with PT   Liver mass MRI of the abdomen shows right hepatic lesion favoring  metastatic lesion from the colon cancer IR consulted for CT-guided liver biopsy as his abdominal distention is improving.  He will also need oncology follow-up on discharge.      Anemia of chronic disease/ No signs of bleeding Hemoglobin appears to be stable around 12.   History of prostate cancer  Status post XRT in 2015   Acute Kidney injury probably secondary to prerenal/dehydration Creatinine improved with fluids and repeat renal parameters within normal limits.  Hypernatremia Improving with dextrose fluids.  Continue to monitor  Hypophosphatemia:  Resolved repeat level within normal limits.   Nutrition  Continue with TPN  Acute urinary retention:  Probably secondary to ileus once ileus has resolved Foley catheter will be removed.   Hypokalemia Replaced  DVT prophylaxis: Lovenox  code Status: Full code Family Communication: None and at bedside. Discussed the plan with his wife over the phone.  Disposition Plan: Pending clinical improvement and resumption of bowel function and palliative care consult.    Consultants:   General surgery  Gastroenterology.  Palliative care consult.   Procedures: S/Pex lap, diverting loop descending colostomy - Dr. Ninfa Linden 10/4  Antimicrobials: aztreonam.  And Flagyl since admission.  Completed 8 days of IV antibiotics.  Subjective: Patient seen and examined today patient reports his nausea has improved and he does not have any abdominal pain this morning he work with physical therapy and his he reports his abdominal distention has improved when compared to yesterday.  He appears to be in good spirits.  Objective: Vitals:   09/28/19 0150 09/28/19 0204 09/28/19 0453 09/28/19 1358  BP:  98/72 109/64 112/77  Pulse:  98 96  98  Resp: (!) 23 18 19 18   Temp:  98.7 F (37.1 C) (!) 97.5 F (36.4 C) 98.5 F (36.9 C)  TempSrc:  Oral  Oral  SpO2:  97% 100% 100%  Weight:      Height:        Intake/Output Summary (Last 24  hours) at 09/28/2019 1445 Last data filed at 09/28/2019 1300 Gross per 24 hour  Intake 3348.03 ml  Output 2550 ml  Net 798.03 ml   Filed Weights   09/19/19 2200 09/23/19 0850  Weight: 70.3 kg 70.3 kg    Examination:  General exam: Cachectic looking gentleman without any distress Respiratory system: Air entry fair, no wheezing or rhonchi Cardiovascular system: S1-S2 heard, regular rate rhythm, no JVD Gastrointestinal system: Abdomen is soft, mildly distended no tenderness, JP drain in place, colostomy bag with small amount of stool.  Central nervous system: Alert and oriented and answering questions appropriately Extremities: No pedal edema Skin: Has colostomy bag no rashes seen Psychiatry: Patient is in good spirits and mood is appropriate    Data Reviewed: I have personally reviewed following labs and imaging studies  CBC: Recent Labs  Lab 09/22/19 0411 09/23/19 0410 09/24/19 0431 09/25/19 0203 09/26/19 0417 09/28/19 0348  WBC 17.0* 13.9* 12.7* 12.0* 13.1* 12.2*  NEUTROABS 15.3* 12.3* 11.5* 10.6* 11.4*  --   HGB 11.0* 10.9* 12.7* 12.9* 12.8* 11.6*  HCT 32.9* 32.6* 40.0 39.5 39.5 36.5*  MCV 92.9 94.2 95.7 95.6 96.3 97.6  PLT 385 385 466* 413* 416* [id number]   Basic Metabolic Panel: Recent Labs  Lab 09/24/19 0431 09/25/19 0203 09/26/19 0417 09/27/19 0429 09/28/19 0348  NA 151* 150* 153* 152* 150*  K 3.7 3.9 3.5 3.1* 3.4*  CL 115* 118* 114* 115* 114*  CO2 23 23 28 29 27   GLUCOSE 115* 145* 157* 141* 127*  BUN 26* 24* 23 17 18   CREATININE 1.23 0.92 0.93 0.79 0.72  CALCIUM 8.1* 8.0* 8.4* 7.9* 8.0*  MG  --   --  1.9 1.9 2.0  PHOS  --   --  2.3* 3.0 2.5   GFR: Estimated Creatinine Clearance: 71.3 mL/min (by C-G formula based on SCr of 0.72 mg/dL). Liver Function Tests: Recent Labs  Lab 09/26/19 0417  AST 29  ALT 19  ALKPHOS 57  BILITOT 0.5  PROT 5.3*  ALBUMIN 1.8*   No results for input(s): LIPASE, AMYLASE in the last 168 hours. No results for input(s):  AMMONIA in the last 168 hours. Coagulation Profile: No results for input(s): INR, PROTIME in the last 168 hours. Cardiac Enzymes: No results for input(s): CKTOTAL, CKMB, CKMBINDEX, TROPONINI in the last 168 hours. BNP (last 3 results) No results for input(s): PROBNP in the last 8760 hours. HbA1C: No results for input(s): HGBA1C in the last 72 hours. CBG: Recent Labs  Lab 09/27/19 2023 09/27/19 2353 09/28/19 0453 09/28/19 0755 09/28/19 1229  GLUCAP 151* 124* 132* 129* 126*   Lipid Profile: Recent Labs    09/26/19 0417  TRIG 72   Thyroid Function Tests: No results for input(s): TSH, T4TOTAL, FREET4, T3FREE, THYROIDAB in the last 72 hours. Anemia Panel: No results for input(s): VITAMINB12, FOLATE, FERRITIN, TIBC, IRON, RETICCTPCT in the last 72 hours. Sepsis Labs: No results for input(s): PROCALCITON, LATICACIDVEN in the last 168 hours.  Recent Results (from the past 240 hour(s))  SARS Coronavirus 2 [facility] order, Performed in [facility] hospital lab) Nasopharyngeal Nasopharyngeal Swab     Status: None   Collection Time: 09/19/19  9:43 PM   Specimen: Nasopharyngeal Swab  Result Value Ref Range Status   SARS Coronavirus 2 NEGATIVE NEGATIVE Final    Comment: (NOTE) If result is NEGATIVE SARS-CoV-2 target nucleic acids are NOT DETECTED. The SARS-CoV-2 RNA is generally detectable in upper and lower  respiratory specimens during the acute phase of infection. The lowest  concentration of SARS-CoV-2 viral copies this assay can detect is 250  copies / mL. A negative result does not preclude SARS-CoV-2 infection  and should not be used as the sole basis for treatment or other  patient management decisions.  A negative result may occur with  improper specimen collection / handling, submission of specimen other  than nasopharyngeal swab, presence of viral mutation(s) within the  areas targeted by this assay, and inadequate number of viral copies  (<250 copies / mL). A negative  result must be combined with clinical  observations, patient history, and epidemiological information. If result is POSITIVE SARS-CoV-2 target nucleic acids are DETECTED. The SARS-CoV-2 RNA is generally detectable in upper and lower  respiratory specimens dur ing the acute phase of infection.  Positive  results are indicative of active infection with SARS-CoV-2.  Clinical  correlation with patient history and other diagnostic information is  necessary to determine patient infection status.  Positive results do  not rule out bacterial infection or co-infection with other viruses. If result is PRESUMPTIVE POSTIVE SARS-CoV-2 nucleic acids MAY BE PRESENT.   A presumptive positive result was obtained on the submitted specimen  and confirmed on repeat testing.  While 2019 novel coronavirus  (SARS-CoV-2) nucleic acids may be present in the submitted sample  additional confirmatory testing may be necessary for epidemiological  and / or clinical management purposes  to differentiate between  SARS-CoV-2 and other Sarbecovirus currently known to infect humans.  If clinically indicated additional testing with an alternate test  methodology [id number]) is advised. The SARS-CoV-2 RNA is generally  detectable in upper and lower respiratory sp ecimens during the acute  phase of infection. The expected result is Negative. Fact Sheet for Patients:  StrictlyIdeas.no Fact Sheet for Healthcare Providers: BankingDealers.co.za This test is not yet approved or cleared by the [place] FDA and has been authorized for detection and/or diagnosis of SARS-CoV-2 by FDA under an Emergency Use Authorization (EUA).  This EUA will remain in effect (meaning this test can be used) for the duration of the COVID-19 declaration under Section 564(b)(1) of the Act, 21 U.S.C. section 360bbb-3(b)(1), unless the authorization is terminated or revoked sooner. Performed at [facility], [place] [address], [place], [place] [address]   MRSA PCR Screening     Status: None   Collection Time: 09/20/19  1:25 AM   Specimen: Nasopharyngeal  Result Value Ref Range Status   MRSA by PCR NEGATIVE NEGATIVE Final    Comment:        The GeneXpert MRSA Assay (FDA approved for NASAL specimens only), is one component of a comprehensive MRSA colonization surveillance program. It is not intended to diagnose MRSA infection nor to guide or monitor treatment for MRSA infections. Performed at [facility], [place] [address] [place], [place] [address]          Radiology Studies: Dg Abd 2 Views  Result Date: 09/27/2019 CLINICAL DATA:  Follow-up bowel obstruction. EXAM: ABDOMEN - 2 VIEW COMPARISON:  Radiograph of September 25, 2019. FINDINGS: Nasogastric tube tip is seen in proximal stomach. Stable diffuse gaseous dilatation of large and small bowel is  noted. Surgical drain is noted in the pelvis. Phleboliths are noted in the pelvis. No definite pneumoperitoneum is noted. IMPRESSION: Grossly stable large and small bowel dilatation is noted concerning for ileus or less likely obstruction. Electronically Signed   By: Marijo Conception M.D.   On: 09/27/2019 11:31        Scheduled Meds: . Chlorhexidine Gluconate Cloth  6 each Topical Daily  . enoxaparin (LOVENOX) injection  40 mg Subcutaneous Q24H  . insulin aspart  0-9 Units Subcutaneous Q4H   Continuous Infusions: . dextrose 5 % and 0.45% NaCl 75 mL/hr at 09/28/19 1241  . TPN ADULT (ION) 75 mL/hr at 09/27/19 1749  . TPN ADULT (ION)       LOS: 9 days       Hosie Poisson, MD [facility] Pager [phone]   If 7PM-7AM, please contact night-coverage [email or web address] Password [email or web address] 09/28/2019, 2:45 PM

## 2019-09-29 LAB — BASIC METABOLIC PANEL
Anion gap: 7 (ref 5–15)
BUN: 19 mg/dL (ref 8–23)
CO2: 25 mmol/L (ref 22–32)
Calcium: 7.7 mg/dL — ABNORMAL LOW (ref 8.9–10.3)
Chloride: 114 mmol/L — ABNORMAL HIGH (ref 98–111)
Creatinine, Ser: 0.72 mg/dL (ref 0.61–1.24)
GFR calc Af Amer: >60 mL/min (ref >60)
GFR calc non Af Amer: >60 mL/min (ref >60)
Glucose, Bld: 127 mg/dL — ABNORMAL HIGH (ref 70–99)
Potassium: 3.7 mmol/L (ref 3.5–5.1)
Sodium: 146 mmol/L — ABNORMAL HIGH (ref 135–145)

## 2019-09-29 LAB — MAGNESIUM: Magnesium: 1.7 mg/dL (ref 1.7–2.4)

## 2019-09-29 LAB — GLUCOSE, CAPILLARY
Glucose-Capillary: 112 mg/dL — ABNORMAL HIGH (ref 70–99)
Glucose-Capillary: 111 mg/dL — ABNORMAL HIGH (ref 70–99)
Glucose-Capillary: 118 mg/dL — ABNORMAL HIGH (ref 70–99)
Glucose-Capillary: 125 mg/dL — ABNORMAL HIGH (ref 70–99)
Glucose-Capillary: 116 mg/dL — ABNORMAL HIGH (ref 70–99)

## 2019-09-29 LAB — PHOSPHORUS: Phosphorus: 2.7 mg/dL (ref 2.5–4.6)

## 2019-09-29 MED ORDER — POTASSIUM CHLORIDE 10 MEQ/50ML IV SOLN
10.0000 meq | INTRAVENOUS | Status: AC
  Administered 2019-09-29 (×3): 10 meq via INTRAVENOUS
  Filled 2019-09-29 (×3): qty 50

## 2019-09-29 MED ORDER — MAGNESIUM SULFATE 2 GM/50ML IV SOLN
2.0000 g | Freq: Once | INTRAVENOUS | Status: AC
  Administered 2019-09-29: 09:00:00 2 g via INTRAVENOUS
  Filled 2019-09-29: qty 50

## 2019-09-29 MED ORDER — ENOXAPARIN SODIUM 40 MG/0.4ML ~~LOC~~ SOLN
40.0000 mg | SUBCUTANEOUS | Status: DC
  Administered 2019-10-01 – 2019-10-04 (×4): 40 mg via SUBCUTANEOUS
  Filled 2019-09-29 (×4): qty 0.4

## 2019-09-29 MED ORDER — TRAVASOL 10 % IV SOLN
INTRAVENOUS | Status: AC
  Administered 2019-09-29: 17:00:00 via INTRAVENOUS
  Filled 2019-09-29: qty 990

## 2019-09-29 NOTE — Progress Notes (Signed)
Chaplain responded to spiritual consult.  Patient requesting prayer; facing new metastatic cancer diagnosis.  Patient was not in room when chaplain came to visit.  Will try again later today if able. Please page if patient requests visit. Rev. Tamsen Snider Pager [phone]

## 2019-09-29 NOTE — Progress Notes (Signed)
Pt discontinued foley cath around 0900 still no urine output, encourage to drink fluids, will continue to monitor.

## 2019-09-29 NOTE — Progress Notes (Signed)
Rt nare ngt

## 2019-09-29 NOTE — Progress Notes (Signed)
Pt unable to urinate after removing the foley, bladder shows 999 ml , reinserted foley , urine output 350 ml as of now.

## 2019-09-29 NOTE — Progress Notes (Signed)
Patient ID: Charles Espinoza, male   DOB: [birth year], 78 y.o.   MRN: [id number] [facility] Progress Note:   6 Days Post-Op  Subjective: Mental status is clear;  Complaining of penis pain from Foley Objective: Vital signs in last 24 hours: Temp:  [98.3 F (36.8 C)-98.6 F (37 C)] 98.3 F (36.8 C) (10/11 0410) Pulse Rate:  [93-98] 95 (10/11 0410) Resp:  [17-18] 18 (10/11 0410) BP: (95-112)/(72-77) 95/72 (10/11 0410) SpO2:  [100 %] 100 % (10/11 0410)  Intake/Output from previous day: 10/10 0701 - 10/11 0700 In: 2509.9 [P.O.:60; I.V.:2389.9; NG/GT:60] Out: 3015 [Urine:850; Emesis/NG output:1550; Drains:365; Stool:250] Intake/Output this shift: No intake/output data recorded.  Physical Exam: Work of breathing is not labored.  Ostomy with some brown material  Lab Results:  Results for orders placed or performed during the hospital encounter of 09/19/19 (from the past 48 hour(s))  Glucose, capillary     Status: Abnormal   Collection Time: 09/27/19  8:59 AM  Result Value Ref Range   Glucose-Capillary 130 (H) 70 - 99 mg/dL  Glucose, capillary     Status: Abnormal   Collection Time: 09/27/19  1:04 PM  Result Value Ref Range   Glucose-Capillary 154 (H) 70 - 99 mg/dL  Glucose, capillary     Status: None   Collection Time: 09/27/19  4:39 PM  Result Value Ref Range   Glucose-Capillary 99 70 - 99 mg/dL  Glucose, capillary     Status: Abnormal   Collection Time: 09/27/19  8:23 PM  Result Value Ref Range   Glucose-Capillary 151 (H) 70 - 99 mg/dL  Glucose, capillary     Status: Abnormal   Collection Time: 09/27/19 11:53 PM  Result Value Ref Range   Glucose-Capillary 124 (H) 70 - 99 mg/dL   Comment 1 Notify RN    Comment 2 Document in Chart   Basic metabolic panel     Status: Abnormal   Collection Time: 09/28/19  3:48 AM  Result Value Ref Range   Sodium 150 (H) 135 - 145 mmol/L   Potassium 3.4 (L) 3.5 - 5.1 mmol/L   Chloride 114 (H) 98 - 111 mmol/L   CO2 27 22 - 32 mmol/L    Glucose, Bld 127 (H) 70 - 99 mg/dL   BUN 18 8 - 23 mg/dL   Creatinine, Ser 0.72 0.61 - 1.24 mg/dL   Calcium 8.0 (L) 8.9 - 10.3 mg/dL   GFR calc non Af Amer >60 >60 mL/min   GFR calc Af Amer >60 >60 mL/min   Anion gap 9 5 - 15    Comment: Performed at [facility], [address], [place], [place] [address]  Magnesium     Status: None   Collection Time: 09/28/19  3:48 AM  Result Value Ref Range   Magnesium 2.0 1.7 - 2.4 mg/dL    Comment: Performed at [facility] [address], [place], [place] [address]  Phosphorus     Status: None   Collection Time: 09/28/19  3:48 AM  Result Value Ref Range   Phosphorus 2.5 2.5 - 4.6 mg/dL    Comment: Performed at [facility] [address] [place] [address]  CBC     Status: Abnormal   Collection Time: 09/28/19  3:48 AM  Result Value Ref Range   WBC 12.2 (H) 4.0 - 10.5 K/uL   RBC 3.74 (L) 4.22 - 5.81 MIL/uL   Hemoglobin 11.6 (L) 13.0 - 17.0 g/dL  HCT 36.5 (L) 39.0 - 52.0 %   MCV 97.6 80.0 - 100.0 fL   MCH 31.0 26.0 - 34.0 pg   MCHC 31.8 30.0 - 36.0 g/dL   RDW 14.0 11.5 - 15.5 %   Platelets 306 150 - 400 K/uL   nRBC 0.0 0.0 - 0.2 %    Comment: Performed at [facility], [place] [address] [place], [place] [address]  Glucose, capillary     Status: Abnormal   Collection Time: 09/28/19  4:53 AM  Result Value Ref Range   Glucose-Capillary 132 (H) 70 - 99 mg/dL   Comment 1 Notify RN    Comment 2 Document in Chart   Glucose, capillary     Status: Abnormal   Collection Time: 09/28/19  7:55 AM  Result Value Ref Range   Glucose-Capillary 129 (H) 70 - 99 mg/dL  Glucose, capillary     Status: Abnormal   Collection Time: 09/28/19 12:29 PM  Result Value Ref Range   Glucose-Capillary 126 (H) 70 - 99 mg/dL  Glucose, capillary     Status: Abnormal   Collection Time: 09/28/19  3:56 PM  Result Value Ref Range   Glucose-Capillary 123 (H) 70 - 99 mg/dL  Glucose, capillary     Status: Abnormal   Collection Time:  09/28/19  7:57 PM  Result Value Ref Range   Glucose-Capillary 121 (H) 70 - 99 mg/dL  Glucose, capillary     Status: Abnormal   Collection Time: 09/28/19 11:53 PM  Result Value Ref Range   Glucose-Capillary 117 (H) 70 - 99 mg/dL  Basic metabolic panel     Status: Abnormal   Collection Time: 09/29/19  4:05 AM  Result Value Ref Range   Sodium 146 (H) 135 - 145 mmol/L   Potassium 3.7 3.5 - 5.1 mmol/L   Chloride 114 (H) 98 - 111 mmol/L   CO2 25 22 - 32 mmol/L   Glucose, Bld 127 (H) 70 - 99 mg/dL   BUN 19 8 - 23 mg/dL   Creatinine, Ser 0.72 0.61 - 1.24 mg/dL   Calcium 7.7 (L) 8.9 - 10.3 mg/dL   GFR calc non Af Amer >60 >60 mL/min   GFR calc Af Amer >60 >60 mL/min   Anion gap 7 5 - 15    Comment: Performed at [facility], [place] [address], [place], [place] [address]  Magnesium     Status: None   Collection Time: 09/29/19  4:05 AM  Result Value Ref Range   Magnesium 1.7 1.7 - 2.4 mg/dL    Comment: Performed at [facility] [address] [place], [place] [address]  Phosphorus     Status: None   Collection Time: 09/29/19  4:05 AM  Result Value Ref Range   Phosphorus 2.7 2.5 - 4.6 mg/dL    Comment: Performed at [facility] [address] [place], [place] [address]  Glucose, capillary     Status: Abnormal   Collection Time: 09/29/19  4:06 AM  Result Value Ref Range   Glucose-Capillary 112 (H) 70 - 99 mg/dL    Radiology/Results: Dg Abd 2 Views  Result Date: 09/27/2019 CLINICAL DATA:  Follow-up bowel obstruction. EXAM: ABDOMEN - 2 VIEW COMPARISON:  Radiograph of September 25, 2019. FINDINGS: Nasogastric tube tip is seen in proximal stomach. Stable diffuse gaseous dilatation of large and small bowel is noted. Surgical drain is noted in the pelvis. Phleboliths are noted in the pelvis. No definite pneumoperitoneum is noted.  IMPRESSION: Grossly stable large and small bowel dilatation is noted concerning for ileus or less likely obstruction. Electronically Signed   By: Marijo Conception M.D.   On: 09/27/2019 11:31    Anti-infectives: Anti-infectives (From admission, onward)   Start     Dose/Rate Route Frequency Ordered Stop   09/25/19 1600  aztreonam (AZACTAM) 2 g in sodium chloride 0.9 % 100 mL IVPB  Status:  Discontinued     2 g 200 mL/hr over 30 Minutes Intravenous Every 8 hours 09/25/19 0938 09/27/19 1900   09/20/19 0800  aztreonam (AZACTAM) 1 g in sodium chloride 0.9 % 100 mL IVPB  Status:  Discontinued     1 g 200 mL/hr over 30 Minutes Intravenous Every 8 hours 09/19/19 2246 09/25/19 0938   09/20/19 0700  metroNIDAZOLE (FLAGYL) IVPB 500 mg  Status:  Discontinued     500 mg 100 mL/hr over 60 Minutes Intravenous Every 8 hours 09/19/19 2310 09/27/19 1900   09/19/19 2245  aztreonam (AZACTAM) 2 g in sodium chloride 0.9 % 100 mL IVPB     2 g 200 mL/hr over 30 Minutes Intravenous  Once 09/19/19 2236 09/19/19 2332   09/19/19 2245  metroNIDAZOLE (FLAGYL) IVPB 500 mg     500 mg 100 mL/hr over 60 Minutes Intravenous  Once 09/19/19 2236 09/20/19 0110      Assessment/Plan: Problem List: Patient Active Problem List   Diagnosis Date Noted  . Colon obstruction ([place])   . Palliative care encounter   . Protein-calorie malnutrition, severe 09/26/2019  . Abdominal distension   . Liver mass 09/19/2019  . Colonic mass 09/19/2019  . Mass of colon 09/19/2019  . Malignant neoplasm of prostate ([place]) 08/15/2013    Will discontinue NG and Foley and start clear liquids.   6 Days Post-Op    LOS: 10 days   Matt B. Hassell Done, MD, [facility] [phone] beeper [phone]  09/29/2019 8:16 AM

## 2019-09-29 NOTE — Progress Notes (Signed)
PROGRESS NOTE    Charles Espinoza  [id number] DOB: 08-29-1941 DOA: 09/19/2019 PCP: Rogers Blocker, MD   Brief Narrative:  Male with prior history of prostate cancer s/p radiation in January 2015, COPD presents to ED with generalized abdominal pain for 1 month associated with abdominal distention and watery bowel movements.  CT of the abdomen and pelvis shows volvulus and complex sigmoid collection.  Patient was started on broad-spectrum IV antibiotics and general surgery consulted.  Patient underwent exploratory laparotomy with diverting colostomy on 09/23/2019. Palliative care consulted for goals of care discussion.  Patient seen and examined today.  Patient appears to be in good spirits after the NG tube was removed. Assessment & Plan:   Principal Problem:   Colonic mass Active Problems:   Malignant neoplasm of prostate (HCC)   Liver mass   Mass of colon   Abdominal distension   Protein-calorie malnutrition, severe   Colon obstruction [facility])   Palliative care encounter   Large bowel obstruction secondary to perforated rectal cancer status post exploratory laparotomy with diverting colostomy on 09/22/2019.  Patient's abdominal distention has improved.  NG tube was discontinued by surgery and he is on clear liquid diet at this time.  Patient has colostomy bag with some gas and small stool.  He has a JP drain with serous fluid.  Patient denies any nausea, vomiting.  His abdominal pain has improved.  Patient has completed about 8 days of IV antibiotics.  He remains afebrile WBC count appears to be improving Recommended to get patient out of bed and ambulate as tolerated.   Liver mass MRI of the abdomen shows right hepatic lesion favoring metastatic lesion from the colon cancer IR consulted for CT-guided liver biopsy as his abdominal distention is improving.  He will also need oncology follow-up on discharge.   Anemia of chronic disease/ No signs of bleeding, hemoglobin appears to be  stable   History of prostate cancer  Status post XRT in 2015   Acute Kidney injury probably secondary to prerenal/dehydration Creatinine improved with IV fluids and repeat renal parameters within normal limits  Hypernatremia Improving with IV fluids. Repeat sodium level in the morning.  Hypophosphatemia:  Resolved repeat level within normal limits.   Nutrition  Continue with TPN  Acute urinary retention:  Probably secondary to ileus once ileus has resolved Foley catheter will be removed.  Voiding trial today was unsuccessful Foley has to be replaced.   Hypokalemia Replaced  DVT prophylaxis: Lovenox  code Status: Full code Family Communication: None and at bedside. Discussed the plan with his wife over the phone.  Disposition Plan: Pending clinical improvement and resumption of bowel function and palliative care consult.    Consultants:   General surgery  Gastroenterology.  Palliative care consult.   Procedures: S/Pex lap, diverting loop descending colostomy - Dr. Ninfa Linden 10/4  Antimicrobials: aztreonam.  And Flagyl since admission.  Completed 8 days of IV antibiotics.  Subjective: Patient seen and examined at bedside with his wife, patient denies any nausea vomiting and abdominal pain has improved.  He appears to be in good spirits. Objective: Vitals:   09/28/19 1358 09/28/19 2000 09/29/19 0410 09/29/19 1302  BP: 112/77 109/72 95/72 104/72  Pulse: 98 93 95 90  Resp: 18 17 18 18   Temp: 98.5 F (36.9 C) 98.6 F (37 C) 98.3 F (36.8 C) 97.7 F (36.5 C)  TempSrc: Oral Oral Oral Oral  SpO2: 100% 100% 100% 100%  Weight:      Height:  Intake/Output Summary (Last 24 hours) at 09/29/2019 1606 Last data filed at 09/29/2019 1400 Gross per 24 hour  Intake 4275.56 ml  Output 2785 ml  Net 1490.56 ml   Filed Weights   09/19/19 2200 09/23/19 0850  Weight: 70.3 kg 70.3 kg    Examination:  General exam: Cachectic looking gentleman without any  distress Respiratory system: Diminished at bases, no wheezing or rhonchi Cardiovascular system: S1-S2 heard, regular rate rhythm, no JVD Gastrointestinal system: Abdomen is soft, still mildly distended, mild tenderness at the incision site, JP drain in place, colostomy bag with small stool Central nervous system: Alert and oriented and answering questions appropriately Extremities: No leg edema Skin: No rashes seen Psychiatry: Mood is appropriate   Data Reviewed: I have personally reviewed following labs and imaging studies  CBC: Recent Labs  Lab 09/23/19 0410 09/24/19 0431 09/25/19 0203 09/26/19 0417 09/28/19 0348  WBC 13.9* 12.7* 12.0* 13.1* 12.2*  NEUTROABS 12.3* 11.5* 10.6* 11.4*  --   HGB 10.9* 12.7* 12.9* 12.8* 11.6*  HCT 32.6* 40.0 39.5 39.5 36.5*  MCV 94.2 95.7 95.6 96.3 97.6  PLT 385 466* 413* 416* [id number]   Basic Metabolic Panel: Recent Labs  Lab 09/25/19 0203 09/26/19 0417 09/27/19 0429 09/28/19 0348 09/29/19 0405  NA 150* 153* 152* 150* 146*  K 3.9 3.5 3.1* 3.4* 3.7  CL 118* 114* 115* 114* 114*  CO2 23 28 29 27 25   GLUCOSE 145* 157* 141* 127* 127*  BUN 24* 23 17 18 19   CREATININE 0.92 0.93 0.79 0.72 0.72  CALCIUM 8.0* 8.4* 7.9* 8.0* 7.7*  MG  --  1.9 1.9 2.0 1.7  PHOS  --  2.3* 3.0 2.5 2.7   GFR: Estimated Creatinine Clearance: 71.3 mL/min (by C-G formula based on SCr of 0.72 mg/dL). Liver Function Tests: Recent Labs  Lab 09/26/19 0417  AST 29  ALT 19  ALKPHOS 57  BILITOT 0.5  PROT 5.3*  ALBUMIN 1.8*   No results for input(s): LIPASE, AMYLASE in the last 168 hours. No results for input(s): AMMONIA in the last 168 hours. Coagulation Profile: No results for input(s): INR, PROTIME in the last 168 hours. Cardiac Enzymes: No results for input(s): CKTOTAL, CKMB, CKMBINDEX, TROPONINI in the last 168 hours. BNP (last 3 results) No results for input(s): PROBNP in the last 8760 hours. HbA1C: No results for input(s): HGBA1C in the last 72 hours. CBG:  Recent Labs  Lab 09/28/19 1957 09/28/19 2353 09/29/19 0406 09/29/19 0817 09/29/19 1128  GLUCAP 121* 117* 112* 111* 118*   Lipid Profile: No results for input(s): CHOL, HDL, LDLCALC, TRIG, CHOLHDL, LDLDIRECT in the last 72 hours. Thyroid Function Tests: No results for input(s): TSH, T4TOTAL, FREET4, T3FREE, THYROIDAB in the last 72 hours. Anemia Panel: No results for input(s): VITAMINB12, FOLATE, FERRITIN, TIBC, IRON, RETICCTPCT in the last 72 hours. Sepsis Labs: No results for input(s): PROCALCITON, LATICACIDVEN in the last 168 hours.  Recent Results (from the past 240 hour(s))  SARS Coronavirus 2 [facility] order, Performed in [facility] hospital lab) Nasopharyngeal Nasopharyngeal Swab     Status: None   Collection Time: 09/19/19  9:43 PM   Specimen: Nasopharyngeal Swab  Result Value Ref Range Status   SARS Coronavirus 2 NEGATIVE NEGATIVE Final    Comment: (NOTE) If result is NEGATIVE SARS-CoV-2 target nucleic acids are NOT DETECTED. The SARS-CoV-2 RNA is generally detectable in upper and lower  respiratory specimens during the acute phase of infection. The lowest  concentration of SARS-CoV-2 viral copies this assay can  detect is 250  copies / mL. A negative result does not preclude SARS-CoV-2 infection  and should not be used as the sole basis for treatment or other  patient management decisions.  A negative result may occur with  improper specimen collection / handling, submission of specimen other  than nasopharyngeal swab, presence of viral mutation(s) within the  areas targeted by this assay, and inadequate number of viral copies  (<250 copies / mL). A negative result must be combined with clinical  observations, patient history, and epidemiological information. If result is POSITIVE SARS-CoV-2 target nucleic acids are DETECTED. The SARS-CoV-2 RNA is generally detectable in upper and lower  respiratory specimens dur ing the acute phase of infection.  Positive   results are indicative of active infection with SARS-CoV-2.  Clinical  correlation with patient history and other diagnostic information is  necessary to determine patient infection status.  Positive results do  not rule out bacterial infection or co-infection with other viruses. If result is PRESUMPTIVE POSTIVE SARS-CoV-2 nucleic acids MAY BE PRESENT.   A presumptive positive result was obtained on the submitted specimen  and confirmed on repeat testing.  While 2019 novel coronavirus  (SARS-CoV-2) nucleic acids may be present in the submitted sample  additional confirmatory testing may be necessary for epidemiological  and / or clinical management purposes  to differentiate between  SARS-CoV-2 and other Sarbecovirus currently known to infect humans.  If clinically indicated additional testing with an alternate test  methodology [phone]) is advised. The SARS-CoV-2 RNA is generally  detectable in upper and lower respiratory sp ecimens during the acute  phase of infection. The expected result is Negative. Fact Sheet for Patients:  StrictlyIdeas.no Fact Sheet for Healthcare Providers: BankingDealers.co.za This test is not yet approved or cleared by the [place] FDA and has been authorized for detection and/or diagnosis of SARS-CoV-2 by FDA under an Emergency Use Authorization (EUA).  This EUA will remain in effect (meaning this test can be used) for the duration of the COVID-19 declaration under Section 564(b)(1) of the Act, 21 U.S.C. section 360bbb-3(b)(1), unless the authorization is terminated or revoked sooner. Performed at [facility], [place] [address] [place], [place] [address]   MRSA PCR Screening     Status: None   Collection Time: 09/20/19  1:25 AM   Specimen: Nasopharyngeal  Result Value Ref Range Status   MRSA by PCR NEGATIVE NEGATIVE Final    Comment:        The GeneXpert MRSA Assay (FDA approved for NASAL  specimens only), is one component of a comprehensive MRSA colonization surveillance program. It is not intended to diagnose MRSA infection nor to guide or monitor treatment for MRSA infections. Performed at [facility], [place] [address], [place], [place] [address]          Radiology Studies: No results found.      Scheduled Meds: . Chlorhexidine Gluconate Cloth  6 each Topical Daily  . [START ON 10/01/2019] enoxaparin (LOVENOX) injection  40 mg Subcutaneous Q24H   Continuous Infusions: . dextrose 5 % and 0.45% NaCl 75 mL/hr at 09/29/19 0400  . TPN ADULT (ION) 75 mL/hr at 09/29/19 0400  . TPN ADULT (ION)       LOS: 10 days       Hosie Poisson, MD [facility] Pager [phone]   If 7PM-7AM, please contact night-coverage [email or web address] Password [email or web address] 09/29/2019, 4:06 PM

## 2019-09-29 NOTE — Consult Note (Signed)
Chief Complaint: Patient was seen in consultation today for liver lesion  Referring Physician(s): Dr. Karleen Hampshire  Supervising Physician: Arne Cleveland  Patient Status: [facility] - In-pt  History of Present Illness: Charles Espinoza is a 78 y.o. male with past medical history of prostate cancer s/p radiation, COPD, HTN who presented with [facility] ED with bowel obstruction, concern for rectal carcinoma with possible perforation, and large liver lesion.  Patient underwent diverting loop descending colostomy with Dr. Ninfa Linden.  With post-op ileus, now on TPN. A liver biopsy was anticipated with surgery, however lesion was not accessible. IR now consulted for percutaneous liver lesion biopsy at the request of Dr. Karleen Hampshire.   Case reviewed and approved by Dr. Vernard Gambles.  Patient assessed at bedside.  He is thin, frail.  On TPN.  Resting comfortably.  Alert and oriented. No complaints during my visit.     Past Medical History:  Diagnosis Date   COPD (chronic obstructive pulmonary disease) (HCC)    Elevated PSA    Hx of radiation therapy 09/25/13- 11/27/13   prostate 7800 cGy 39 sessions   Hyperlipidemia    Prostate cancer ([place]) 02/27/13   gleason 6, volume 133 cc   Prostatitis    Vitamin D deficiency     Past Surgical History:  Procedure Laterality Date   COLOSTOMY  09/23/2019   Procedure: DIVERTING LOOP DESCENDING COLOSTOMY;  Surgeon: Coralie Keens, MD;  Location: [place];  Service: General;;   LAPAROTOMY N/A 09/23/2019   Procedure: EXPLORATORY LAPAROTOMY;  Surgeon: Coralie Keens, MD;  Location: [place];  Service: General;  Laterality: N/A;   PROSTATE BIOPSY  02/27/2013   TONSILLECTOMY      Allergies: Penicillins  Medications: Prior to Admission medications   Medication Sig Start Date End Date Taking? Authorizing Provider  Difluprednate (DUREZOL) 0.05 % EMUL Place 1 drop into the left eye daily.   Yes [provider]     Family History  Problem Relation Age of Onset    Prostate cancer Other     Social History   Socioeconomic History   Marital status: Married    Spouse name: Not on file   Number of children: 3   Years of education: Not on file   Highest education level: Not on file  Occupational History   Not on file  Social Needs   Financial resource strain: Not on file   Food insecurity    Worry: Not on file    Inability: Not on file   Transportation needs    Medical: Not on file    Non-medical: Not on file  Tobacco Use   Smoking status: Current Every Day Smoker    Packs/day: 1.00    Years: 57.00    Pack years: 57.00    Last attempt to quit: 11/22/2012    Years since quitting: 6.8   Smokeless tobacco: Never Used  Substance and Sexual Activity   Alcohol use: Never    Frequency: Never   Drug use: Yes    Types: Marijuana   Sexual activity: Not on file  Lifestyle   Physical activity    Days per week: Not on file    Minutes per session: Not on file   Stress: Not on file  Relationships   Social connections    Talks on phone: Not on file    Gets together: Not on file    Attends religious service: Not on file    Active member of club or organization: Not on file  Attends meetings of clubs or organizations: Not on file    Relationship status: Not on file  Other Topics Concern   Not on file  Social History Narrative   Not on file     Review of Systems: A 12 point ROS discussed and pertinent positives are indicated in the HPI above.  All other systems are negative.  Review of Systems  Constitutional: Negative for fatigue and fever.  Respiratory: Negative for cough and shortness of breath.   Cardiovascular: Negative for chest pain.  Gastrointestinal: Positive for abdominal pain. Negative for nausea and vomiting.  Genitourinary: Negative for dysuria.  Musculoskeletal: Negative for back pain.  Psychiatric/Behavioral: Negative for behavioral problems and confusion.    Vital Signs: BP 95/72 (BP Location: Left  Arm)    Pulse 95    Temp 98.3 F (36.8 C) (Oral)    Resp 18    Ht 5\' 8"  (1.727 m)    Wt 155 lb (70.3 kg)    SpO2 100%    BMI 23.57 kg/m   Physical Exam Vitals signs and nursing note reviewed.  Constitutional:      General: He is not in acute distress.    Appearance: He is ill-appearing.  Cardiovascular:     Rate and Rhythm: Normal rate and regular rhythm.  Pulmonary:     Effort: Pulmonary effort is normal. No respiratory distress.  Abdominal:     Tenderness: There is abdominal tenderness.  Skin:    General: Skin is warm and dry.  Neurological:     General: No focal deficit present.     Mental Status: He is alert and oriented to person, place, and time.  Psychiatric:        Mood and Affect: Mood normal.        Behavior: Behavior normal.      MD Evaluation Airway: WNL Heart: WNL Abdomen: Other (comments) Abdomen comments: s/p colectomy, wound Chest/ Lungs: WNL ASA  Classification: 3 Mallampati/Airway Score: Two   Imaging: Ct Abdomen Pelvis Wo Contrast  Result Date: 09/22/2019 CLINICAL DATA:  78 year old male with free air on prior plain film EXAM: CT ABDOMEN AND PELVIS WITHOUT CONTRAST TECHNIQUE: Multidetector CT imaging of the abdomen and pelvis was performed following the standard protocol without IV contrast. COMPARISON:  Plain film 09/22/2019, CT 09/19/2019 FINDINGS: Lower chest: Atelectasis at the lung bases with respiratory motion somewhat limiting the lung evaluation. Gastric tube terminates at the GE junction. Hepatobiliary: The previous identified right liver mass is incompletely characterized on the current CT. Unremarkable gallbladder. No radiopaque cholelithiasis. Pancreas: Unremarkable Spleen: Unremarkable Adrenals/Urinary Tract: Unremarkable adrenal glands. Fullness in the bilateral collecting system with distention of the urinary. Bilateral cysts of the kidneys, likely Bosniak 1 cysts. Stomach/Bowel: Stomach is decompressed. Similar degree mild distention of small  bowel loops. Similar degree of colonic distention with moderate to large stool burden. The irregular appearance of the distal rectum, potentially a rectal carcinoma and better characterized on prior CT, is similar in appearance. There is evidence of ongoing abscess in the perirectal region, with fluid and gas collection measuring transversely 7.9 cm. Evidence of free air in the left and right subdiaphragmatic region corresponding to findings on prior plain film. Vascular/Lymphatic: Mild atherosclerotic changes of the aorta and iliac arteries. Reproductive: Prostate measures 5.2 cm transverse diameter with impression on the bladder base. Other: Body wall edema and mesenteric edema/anasarca. Musculoskeletal: . no acute displaced fracture. Degenerative changes of the spine. IMPRESSION: CT demonstrates free air in the bilateral subdiaphragmatic region, compatible with  hollow viscus perforation. The precise site of perforation is not identified on the current CT, however, the presumed diagnosis is a perforated and obstructing rectal colon carcinoma, of which this would be the most likely source of the perforation. Persisting evidence of colonic obstruction, with moderate to large stool burden. Metastatic disease to the liver again demonstrated. Perirectal abscess measuring 7.9 cm at the site of the presumed rectal cancer. These results were discussed by telephone at the time of interpretation on 09/22/2019 at 3:14 pm with Dr. Georganna Skeans of surgery. Distention of urinary bladder, which is the presumed etiology of mild bilateral dilation of the collecting system. Body wall/mesenteric edema/anasarca. Additional ancillary findings as above. Electronically Signed   By: Corrie Mckusick D.O.   On: 09/22/2019 15:17   Dg Abd 1 View  Result Date: 09/23/2019 CLINICAL DATA:  Nasogastric tube advancement. Recent exploratory laparotomy with colostomy. EXAM: ABDOMEN - 1 VIEW COMPARISON:  One view abdomen 09/23/2019 and 09/21/2019.  FINDINGS: 1309 hours. The enteric tube has been advanced with the tip at the level of the gastric fundus and the side hole near the GE junction. Mild diffuse bowel distention appears unchanged. There is mild atelectasis at both lung bases. IMPRESSION: The enteric tube has been advanced, tip now overlying the gastric fundus. Electronically Signed   By: Richardean Sale M.D.   On: 09/23/2019 13:34   X-ray Abdomen Ap  Result Date: 09/23/2019 CLINICAL DATA:  Reason for exam: S/P abdominal surgery, follow up exam RN informs there is questionable displacement of NG tube. S/P EXPLORATORY LAPAROTOMY EXAM: ABDOMEN - 1 VIEW COMPARISON:  Abdominal radiograph 09/21/2019 FINDINGS: The nasogastric tube side port tip projects over the mid esophagus. Diffuse gaseous distention of bowel loops similar to prior exams. Lung bases are clear. No acute finding in the visualized skeleton. IMPRESSION: Nasogastric tube tip projects over the mid esophagus and could be advanced approximately 13 cm to reach the stomach. Electronically Signed   By: Audie Pinto M.D.   On: 09/23/2019 12:22   Ct Chest Wo Contrast  Result Date: 09/20/2019 CLINICAL DATA:  Staging for colon carcinoma. EXAM: CT CHEST WITHOUT CONTRAST TECHNIQUE: Multidetector CT imaging of the chest was performed following the standard protocol without IV contrast. COMPARISON:  MRI of the abdomen, 09/20/2019. CT of the abdomen pelvis, 09/19/2019. FINDINGS: Cardiovascular: Heart is normal in size and configuration. No pericardial effusion. No coronary artery calcifications. Great vessels are normal in caliber. Minor aortic atherosclerotic calcifications. Mediastinum/Nodes: There is a lobulated masses extends from the AP window region of the mediastinum into the left upper lobe. It measures 6.1 x 4.9 cm transversely at the level of the AP window. It extends for a length of 8.5 cm obliquely from inferomedial to anterolateral. It abuts the left anteromedial pleural margin,  which may be invaded. 1.6 cm predominantly hypoattenuating posterior left thyroid lobe nodule. No neck base or axillary masses or enlarged lymph nodes. No other mediastinal masses.  No mediastinal or hilar adenopathy. Trachea and esophagus are unremarkable. Lungs/Pleura: Linear/reticular opacities extend from the left upper lobe mass to the anterior, anterolateral and anteromedial pleural margins. There are no other lung masses. No nodules. Linear opacities are noted in the right lower lobe consistent with atelectasis. Mild centrilobular emphysema. No evidence of pneumonia or pulmonary edema. No pleural effusion or pneumothorax. Upper Abdomen: Poorly defined mass in the right lobe of the liver better seen on the previous day's study. Distention of the visualized bowel prominently with gas. No acute findings. Musculoskeletal: Degenerative changes  of the visualized lower cervical spine. No fracture or acute finding. No osteoblastic or osteolytic lesions. IMPRESSION: 1. Mass extends from the AP window of the mediastinum into the left upper lobe consistent with neoplastic disease, which could be primary or metastatic. 2. There are no other lung masses or nodules to support metastatic disease. 3. No acute findings. 4. Incidental finding a 1.6 cm thyroid nodule on the left. Depending on the outcome of the patient's other findings on this chest CT and prior abdomen and pelvis CT, further evaluation with thyroid ultrasound. If patient is clinically hyperthyroid, consider nuclear medicine thyroid uptake and scan. 5. Mild centrilobular emphysema.  Mild aortic atherosclerosis. Aortic Atherosclerosis (ICD10-I70.0) and Emphysema (ICD10-J43.9). Electronically Signed   By: Lajean Manes M.D.   On: 09/20/2019 13:55   Ct Abdomen Pelvis W Contrast  Result Date: 09/19/2019 CLINICAL DATA:  78 year old male with abdominal pain and swelling. EXAM: CT ABDOMEN AND PELVIS WITH CONTRAST TECHNIQUE: Multidetector CT imaging of the abdomen  and pelvis was performed using the standard protocol following bolus administration of intravenous contrast. CONTRAST:  191mL OMNIPAQUE IOHEXOL 300 MG/ML  SOLN COMPARISON:  Abdominal radiograph dated 09/19/2019 and CT of the abdomen pelvis dated 05/17/2013 FINDINGS: Lower chest: The visualized lung bases are clear. There is mild emphysema. No intra-abdominal free air. No free fluid. Hepatobiliary: There is a 7.5 x 7.0 cm heterogeneously enhancing mass in the right lobe of the liver (segment VII). This mass is suboptimally characterized but concerning for a malignancy such as hepatocellular carcinoma. Further evaluation with MRI without and with contrast a nonemergent basis recommended. No intrahepatic biliary ductal dilatation. The gallbladder is unremarkable. Pancreas: Pancreas is grossly unremarkable as visualized. Spleen: Normal in size without focal abnormality. Adrenals/Urinary Tract: The adrenal glands are unremarkable. There is a 5 mm right renal inferior pole nonobstructing stone. There is no hydronephrosis on either side. Bilateral renal cysts measure up to 2.5 cm in the interpolar aspect of the right kidney. There is a punctate nonobstructing left renal upper pole calculus. There is symmetric enhancement and excretion of contrast by both kidneys. There is diffuse thickened appearance of bladder wall which may be partly related to underdistention or represent cystitis. An infiltrative process is not excluded. Stomach/Bowel: There is segmental thickening of the sigmoid colon. There is a 7.3 x 4.5 cm collection containing debris and air within the pelvis posterior to the urinary bladder and anterior to the rectosigmoid. There is mild diffuse inflammatory changes of the pelvis and perirectal fat. This complex collection is not well characterized but may represent an abscess, and inflamed large diverticula, or a malignancy with a large ulceration. There is distention of the colon with air and stool proximal to  the thickened segment of the sigmoid colon. The cecum is located in the mid abdomen. No evidence of twisting or volvulus. There is no definite evidence of small-bowel obstruction. The appendix is normal. Vascular/Lymphatic: There is moderate aortoiliac atherosclerotic disease. The IVC is unremarkable. No portal venous gas. There is no adenopathy. Reproductive: Mildly enlarged prostate gland with multiple fiducial markers. Prostate measures approximately 5.2 cm in transverse axial diameter. The seminal vesicles are symmetric. Other: There is loss of subcutaneous and mesenteric fat and cachexia. Musculoskeletal: No acute or significant osseous findings. IMPRESSION: 1. Segmental thickening of the sigmoid colon with adjacent inflammatory changes. A 7.3 x 4.5 cm complex collection containing debris and air may represent an abscess or a large malignant ulceration. CT of the pelvis with rectal contrast may provide better evaluation.  The thickened appearance of the sigmoid colon may be infectious or inflammatory in etiology or represent infiltrative neoplasm. Further evaluation with sigmoidoscopy following resolution of acute inflammatory changes recommended. 2. Diffuse distention of the colon proximal to the thickened segment of sigmoid. No valvular. No evidence of small-bowel obstruction. Normal appendix. 3. A 7.5 x 7.0 cm heterogeneously enhancing mass in the right lobe of the liver most consistent with malignancy such as hepatocellular carcinoma. Further evaluation with MRI without and with contrast a nonemergent basis recommended. 4. Aortic Atherosclerosis (ICD10-I70.0). 5. Cachexia. Electronically Signed   By: Anner Crete M.D.   On: 09/19/2019 21:15   Mr Liver W Wo Contrast  Addendum Date: 09/20/2019   ADDENDUM REPORT: 09/20/2019 08:31 ADDENDUM: These results were called by telephone at the time of interpretation on 09/20/2019 at 8:30 am to provider Dr. Horris Latino , who verbally acknowledged these results. Low  T2 signal likely relates to fibrotic changes within this lesion. Electronically Signed   By: Zetta Bills M.D.   On: 09/20/2019 08:31   Result Date: 09/20/2019 CLINICAL DATA:  History hepatic malignancy presenting to the emergency department generalized abdominal pain, worsening past month. EXAM: MRI ABDOMEN WITHOUT AND WITH CONTRAST TECHNIQUE: Multiplanar multisequence MR imaging of the abdomen was performed both before and after the administration of intravenous contrast. CONTRAST:  24mL GADAVIST GADOBUTROL 1 MMOL/ML IV SOLN COMPARISON:  CT evaluation 09/20/2019. FINDINGS: Lower chest: No signs of pleural effusion or definite pulmonary abnormality. Hepatobiliary: 7.9 x 7.5 7.4 cm mass in the posterior right hepatic lobe is poorly enhancing, mainly in the periphery. Lesion displays mixed T2 signal. No signs chronic liver disease portal hypertension. Cholelithiasis. No biliary ductal dilation. Pancreas: No mass, inflammatory changes, or other parenchymal abnormality identified. Spleen:  Within normal limits in size and appearance. Adrenals/Urinary Tract: No masses identified. No evidence of hydronephrosis. Bilateral renal cysts. Stomach/Bowel: Bowel distention is similar to the previous exam. The sigmoid colon not assessed on the current examination. Vascular/Lymphatic: Patent abdominal vasculature. Retroaortic left renal vein. No adenopathy. Other:  None. Musculoskeletal: No signs acute musculoskeletal finding. IMPRESSION: Large right hepatic mass lesion which is poorly enhancing. Constellation of findings favors metastatic lesion from obstructing colon cancer leading to contained perforation adjacent to the sigmoid colon. Primary hepatic neoplasm is felt less likely. No signs of vascular invasion or adenopathy. The presence of susceptibility in the setting of bowel distension limits assessment. Electronically Signed: By: Zetta Bills M.D. On: 09/20/2019 07:59   Dg Abd 2 Views  Result Date:  09/27/2019 CLINICAL DATA:  Follow-up bowel obstruction. EXAM: ABDOMEN - 2 VIEW COMPARISON:  Radiograph of September 25, 2019. FINDINGS: Nasogastric tube tip is seen in proximal stomach. Stable diffuse gaseous dilatation of large and small bowel is noted. Surgical drain is noted in the pelvis. Phleboliths are noted in the pelvis. No definite pneumoperitoneum is noted. IMPRESSION: Grossly stable large and small bowel dilatation is noted concerning for ileus or less likely obstruction. Electronically Signed   By: Marijo Conception M.D.   On: 09/27/2019 11:31   Dg Abd 2 Views  Addendum Date: 09/22/2019   ADDENDUM REPORT: 09/22/2019 12:09 ADDENDUM: Above findings discussed with Dr. Johnathan Hausen at 11:34 a.m. Electronically Signed   By: Marin Olp M.D.   On: 09/22/2019 12:09   Result Date: 09/22/2019 CLINICAL DATA:  Colonic obstruction. EXAM: ABDOMEN - 2 VIEW COMPARISON:  09/21/2019 FINDINGS: Nasogastric tube has been pulled back as tip is located in the expected region of the distal esophagus just above  the gastroesophageal junction. This could be advanced 8-10 cm. Continued air-filled loops of large and small bowel with moderate fecal retention over the right colon and distal descending colon. Collection of air under the right hemidiaphragm on the upright film which may represent free peritoneal air. No definite dilated small bowel loops. Persistent mildly distended bladder. Remainder the exam is unchanged. IMPRESSION: Persistent multiple air-filled loops of large and small bowel with moderate fecal retention over the right and left colon. No definite dilated small bowel loops. Possible free air under the right hemidiaphragm. Right lateral decubitus film versus CT could confirm this finding. Nasogastric tube with tip over the region of the distal esophagus just above the gastroesophageal junction. This could be advanced 8-10 cm. Electronically Signed: By: Marin Olp M.D. On: 09/22/2019 11:25   Dg Abd 2  Views  Result Date: 09/19/2019 CLINICAL DATA:  Distended abdomen EXAM: ABDOMEN - 2 VIEW COMPARISON:  None. FINDINGS: Markedly distended colon extending to the rectum. Mild amount of small bowel gas. No free air. No abnormal calcifications or skeletal lesion. IMPRESSION: Markedly distended colon to the rectum. Findings most compatible with colonic ileus although rectal obstruction possible. Electronically Signed   By: Franchot Gallo M.D.   On: 09/19/2019 16:01   Dg Abd Portable 1v  Result Date: 09/25/2019 CLINICAL DATA:  Acute onset abdominal distension today. EXAM: PORTABLE ABDOMEN - 1 VIEW COMPARISON:  Single-view of the abdomen 09/23/2019 and 09/22/2019. FINDINGS: NG tube remains in place in good position. Diffuse gaseous distention of small and large bowel persists. Large colonic stool burden is again seen. Tube projecting over the pelvis with its tip over the left ilium is consistent with a surgical drain. IMPRESSION: Diffuse gaseous distention of small and large bowel compatible with ileus. Large colonic stool burden noted. Electronically Signed   By: Inge Rise M.D.   On: 09/25/2019 11:02   Dg Abd Portable 1v  Result Date: 09/21/2019 CLINICAL DATA:  NG tube placement EXAM: PORTABLE ABDOMEN - 1 VIEW COMPARISON:  09/19/2019 FINDINGS: The enteric tube projects over the expected region of the gastric body. The tube is kinked at the side-hole. Again noted is extensive gaseous distention of loops of small bowel and colon scattered throughout the abdomen. There is no definite pneumatosis or free air, however evaluation for both is limited by the diffuse gaseous distension. The urinary bladder is significantly distended. IMPRESSION: 1. Enteric tube as above. 2. Persistent gaseous distension of small bowel and colon. 3. Distended urinary bladder. Electronically Signed   By: Constance Holster M.D.   On: 09/21/2019 13:20   [place] Ekg Site Rite  Result Date: 09/25/2019 If Site Rite image not attached,  placement could not be confirmed due to current cardiac rhythm.   Labs:  CBC: Recent Labs    09/24/19 0431 09/25/19 0203 09/26/19 0417 09/28/19 0348  WBC 12.7* 12.0* 13.1* 12.2*  HGB 12.7* 12.9* 12.8* 11.6*  HCT 40.0 39.5 39.5 36.5*  PLT 466* 413* 416* 306    COAGS: No results for input(s): INR, APTT in the last 8760 hours.  BMP: Recent Labs    09/26/19 0417 09/27/19 0429 09/28/19 0348 09/29/19 0405  NA 153* 152* 150* 146*  K 3.5 3.1* 3.4* 3.7  CL 114* 115* 114* 114*  CO2 28 29 27 25   GLUCOSE 157* 141* 127* 127*  BUN 23 17 18 19   CALCIUM 8.4* 7.9* 8.0* 7.7*  CREATININE 0.93 0.79 0.72 0.72  GFRNONAA >60 >60 >60 >60  GFRAA >60 >60 >60 >60  LIVER FUNCTION TESTS: Recent Labs    09/19/19 1658 09/26/19 0417  BILITOT 0.9 0.5  AST 31 29  ALT 24 19  ALKPHOS 108 57  PROT 7.2 5.3*  ALBUMIN 2.8* 1.8*    TUMOR MARKERS: No results for input(s): AFPTM, CEA, CA199, CHROMGRNA in the last 8760 hours.  Assessment and Plan: Liver lesion Metastatic rectal cancer s/p diverting colostomy IR consulted for liver lesion biopsy at the request of Dr. Karleen Hampshire.  Case reviewed by Dr. Vernard Gambles who approves patient for procedure, likely early next week.  NPO p MN tonight for possible procedure in IR tomorrow.  INR ordered.  Lovenox held.   Risks and benefits of liver lesion biopsy was discussed with the patient and/or patient's family including, but not limited to bleeding, infection, damage to adjacent structures or low yield requiring additional tests.  All of the questions were answered and there is agreement to proceed.  Consent signed and in chart.   Thank you for this interesting consult.  I greatly enjoyed meeting Toshio Mckiernan and look forward to participating in their care.  A copy of this report was sent to the requesting provider on this date.  Electronically Signed: Docia Barrier, PA 09/29/2019, 12:10 PM   I spent a total of 40 Minutes    in face to  face in clinical consultation, greater than 50% of which was counseling/coordinating care for liver lesion.

## 2019-09-29 NOTE — Progress Notes (Signed)
PHARMACY - ADULT TOTAL PARENTERAL NUTRITION CONSULT NOTE   Pharmacy Consult for TPN Indication: prolonged ileus and NPO status  Patient Measurements: Height: 5\' 8"  (172.7 cm) Weight: 155 lb (70.3 kg) IBW/kg (Calculated) : 68.4 TPN AdjBW (KG): 70.3 Body mass index is 23.57 kg/m.  Assessment:  78 year old male presented to the hospital with abdominal pain. Found to have a large bowel obstruction due to possible colorectal cancer vs diverticulitis. S/p ex lap with diverting loop descending colostomy on 10/5. Pt now with ileus and prolonged NPO status since 10/1 so will begin TPN while awaiting return of bowel function.  GI: NG clamped 10/6 but with emesis so back to LIWS - NG output 1550 ml - d/c NGT today; 250 ml ostomy output, LBM 10/10; prealbumin 9.7  Endo: No hx DM - CBGs remains well controlled on SSI Insulin requirements in the past 24 hours: 4 units Lytes: Na elevated at 146, K 3.7 (goal >/= 4), Mg 1.7 (goal >/= 2), Phos 2.7, ~CoCa 9.4 Renal: Cruger 0.72, UOP 0.5 mg/kg/hr, D51/2NS at 75 ml/hr - MD adjusting fluids, net -0.37L Pulm: RA Cards: VSS Hepatobil: LFTs + Tbili WNL, TG 72 Neuro: A&Ox4, prn morphine, prn APAP ID: s/p aztreonam + flagyl for IAI - Afebrile, WBC 12.2  TPN Access: PICC placed 10/7 TPN start date: 10/7 Nutritional Goals (per RD recommendation on 10/8): KCal: 1800-2000   Protein: 90-105g Fluid: >1.8L  Goal TPN rate is 75 ml/hr (provides 99g protein, 2038 KCal)  Current Nutrition:  Clear liquids to begin today TPN  Plan:  Continue TPN at goal rate of 59mL/hr  This TPN provides 99g of protein, 324g of dextrose, and 54g of lipids which provides 2038 kCals per day, meeting 100% of patient needs Electrolytes in TPN: continue without sodium, increase K and Mg, 1:2 [id number] Add MVI, trace elements to TPN Discontinue SSI and CBG checks MIVF per MD - D51/2NS back at 83ml/hr, will monitor output closely and need for eventual rate reduction Monitor TPN labs and  signs of refeeding F/U toleration of clear liquids  KCl 56meq IV q1h x 3 Mg 2 g IV x1   Thank you for involving pharmacy in this patient's care.  Renold Genta, PharmD, BCPS Clinical Pharmacist Clinical phone for 09/29/2019 until 3p is [phone] 09/29/2019 7:13 AM  **Pharmacist phone directory can be found on [email or web address] listed under [place]**

## 2019-09-30 ENCOUNTER — Inpatient Hospital Stay (HOSPITAL_COMMUNITY): Payer: Medicare Other

## 2019-09-30 LAB — CBC
HCT: 33.7 % — ABNORMAL LOW (ref 39.0–52.0)
Hemoglobin: 10.4 g/dL — ABNORMAL LOW (ref 13.0–17.0)
MCH: 30.5 pg (ref 26.0–34.0)
MCHC: 30.9 g/dL (ref 30.0–36.0)
MCV: 98.8 fL (ref 80.0–100.0)
Platelets: 279 10*3/uL (ref 150–400)
RBC: 3.41 MIL/uL — ABNORMAL LOW (ref 4.22–5.81)
RDW: 14.1 % (ref 11.5–15.5)
WBC: 7.4 10*3/uL (ref 4.0–10.5)
nRBC: 0 % (ref 0.0–0.2)

## 2019-09-30 LAB — COMPREHENSIVE METABOLIC PANEL
ALT: 21 U/L (ref 0–44)
AST: 41 U/L (ref 15–41)
Albumin: 1.4 g/dL — ABNORMAL LOW (ref 3.5–5.0)
Alkaline Phosphatase: 63 U/L (ref 38–126)
Anion gap: 4 — ABNORMAL LOW (ref 5–15)
BUN: 16 mg/dL (ref 8–23)
CO2: 23 mmol/L (ref 22–32)
Calcium: 7.4 mg/dL — ABNORMAL LOW (ref 8.9–10.3)
Chloride: 112 mmol/L — ABNORMAL HIGH (ref 98–111)
Creatinine, Ser: 0.7 mg/dL (ref 0.61–1.24)
GFR calc Af Amer: >60 mL/min (ref >60)
GFR calc non Af Amer: >60 mL/min (ref >60)
Glucose, Bld: 127 mg/dL — ABNORMAL HIGH (ref 70–99)
Potassium: 4 mmol/L (ref 3.5–5.1)
Sodium: 139 mmol/L (ref 135–145)
Total Bilirubin: 0.4 mg/dL (ref 0.3–1.2)
Total Protein: 4.4 g/dL — ABNORMAL LOW (ref 6.5–8.1)

## 2019-09-30 LAB — PROTIME-INR
INR: 1.2 (ref 0.8–1.2)
Prothrombin Time: 15.4 seconds — ABNORMAL HIGH (ref 11.4–15.2)

## 2019-09-30 LAB — TRIGLYCERIDES: Triglycerides: 64 mg/dL (ref <150)

## 2019-09-30 LAB — DIFFERENTIAL
Abs Immature Granulocytes: 0.04 10*3/uL (ref 0.00–0.07)
Basophils Absolute: 0 10*3/uL (ref 0.0–0.1)
Basophils Relative: 0 %
Eosinophils Absolute: 0.1 10*3/uL (ref 0.0–0.5)
Eosinophils Relative: 1 %
Immature Granulocytes: 1 %
Lymphocytes Relative: 11 %
Lymphs Abs: 0.8 10*3/uL (ref 0.7–4.0)
Monocytes Absolute: 0.4 10*3/uL (ref 0.1–1.0)
Monocytes Relative: 5 %
Neutro Abs: 6.1 10*3/uL (ref 1.7–7.7)
Neutrophils Relative %: 82 %

## 2019-09-30 LAB — GLUCOSE, CAPILLARY
Glucose-Capillary: 100 mg/dL — ABNORMAL HIGH (ref 70–99)
Glucose-Capillary: 102 mg/dL — ABNORMAL HIGH (ref 70–99)
Glucose-Capillary: 103 mg/dL — ABNORMAL HIGH (ref 70–99)
Glucose-Capillary: 120 mg/dL — ABNORMAL HIGH (ref 70–99)
Glucose-Capillary: 121 mg/dL — ABNORMAL HIGH (ref 70–99)
Glucose-Capillary: 124 mg/dL — ABNORMAL HIGH (ref 70–99)

## 2019-09-30 LAB — MAGNESIUM: Magnesium: 1.8 mg/dL (ref 1.7–2.4)

## 2019-09-30 LAB — PREALBUMIN: Prealbumin: 15.4 mg/dL — ABNORMAL LOW (ref 18–38)

## 2019-09-30 LAB — PHOSPHORUS: Phosphorus: 2.7 mg/dL (ref 2.5–4.6)

## 2019-09-30 MED ORDER — FENTANYL CITRATE (PF) 100 MCG/2ML IJ SOLN
INTRAMUSCULAR | Status: AC
  Filled 2019-09-30: qty 2

## 2019-09-30 MED ORDER — MIDAZOLAM HCL 2 MG/2ML IJ SOLN
INTRAMUSCULAR | Status: AC | PRN
  Administered 2019-09-30: 1 mg via INTRAVENOUS

## 2019-09-30 MED ORDER — FENTANYL CITRATE (PF) 100 MCG/2ML IJ SOLN
INTRAMUSCULAR | Status: AC | PRN
  Administered 2019-09-30: 50 ug via INTRAVENOUS

## 2019-09-30 MED ORDER — LIDOCAINE HCL (PF) 1 % IJ SOLN
INTRAMUSCULAR | Status: AC
  Filled 2019-09-30: qty 30

## 2019-09-30 MED ORDER — MIDAZOLAM HCL 2 MG/2ML IJ SOLN
INTRAMUSCULAR | Status: AC
  Filled 2019-09-30: qty 2

## 2019-09-30 MED ORDER — MAGNESIUM SULFATE IN D5W 1-5 GM/100ML-% IV SOLN
1.0000 g | Freq: Once | INTRAVENOUS | Status: AC
  Administered 2019-09-30: 13:00:00 1 g via INTRAVENOUS
  Filled 2019-09-30: qty 100

## 2019-09-30 MED ORDER — GELATIN ABSORBABLE 12-7 MM EX MISC
CUTANEOUS | Status: AC
  Filled 2019-09-30: qty 1

## 2019-09-30 MED ORDER — TRAVASOL 10 % IV SOLN
INTRAVENOUS | Status: AC
  Administered 2019-09-30: 18:00:00 via INTRAVENOUS
  Filled 2019-09-30: qty 990

## 2019-09-30 NOTE — Progress Notes (Signed)
PROGRESS NOTE    Charles Espinoza  [id number] DOB: [birth year] DOA: 09/19/2019 PCP: Rogers Blocker, MD   Brief Narrative:  Male with prior history of prostate cancer s/p radiation in January 2015, COPD presents to ED with generalized abdominal pain for 1 month associated with abdominal distention and watery bowel movements.  CT of the abdomen and pelvis shows volvulus and complex sigmoid collection.  Patient was started on broad-spectrum IV antibiotics and general surgery consulted.  Patient underwent exploratory laparotomy with diverting colostomy on 09/23/2019. Palliative care consulted for goals of care discussion. Family would like to pursue full scope of treatment. Meanwhile pt s nausea, abd pain has improved and NG tube was removed by surgery and started on clears as tolerated.   Pt seen and examined today.  As his bowels have decompressed, IR consulted for liver biopsy . He underwent liver biopsy today and results are pending.    Assessment & Plan:   Principal Problem:   Colonic mass Active Problems:   Malignant neoplasm of prostate (HCC)   Liver mass   Mass of colon   Abdominal distension   Protein-calorie malnutrition, severe   Colon obstruction [facility])   Palliative care encounter   Large bowel obstruction secondary to perforated rectal cancer status post exploratory laparotomy with diverting colostomy on 09/22/2019.  Patient's abdominal distention and abdominal  has improved.  NG tube was discontinued by surgery and he is on clear liquid diet at this time.  Patient has colostomy bag with some gas and small stool.  He has a JP drain with serous fluid.  Patient denies any nausea, vomiting or abdominal pain.   Patient has completed about 8 days of IV antibiotics.  He remains afebrile, WBC wnl.  he is also working with PT.    Liver mass MRI of the abdomen shows right hepatic lesion favoring metastatic lesion from the colon cancer IR consulted for CT-guided liver biopsy as his  abdominal distention is improving.  He will also need oncology follow-up on discharge. IR performed liver biopsy today. Results are pending.    Anemia of chronic disease: Hemoglobin dropped from 12 to 10.  Unclear etiology.  No signs of bleeding.  Get anemia panel.      History of prostate cancer  Status post XRT in 2015   Acute Kidney injury probably secondary to prerenal/dehydration Creatinine back to normal with IV fluids.   Hypernatremia Resolved with IV FLUIDS.   Hypophosphatemia:  Repleted.    Nutrition  Taper TPN, as pt is taking oral intake.    Acute urinary retention:  Probably secondary to ileus once ileus has resolved Foley catheter will be removed.  Voiding trial today was unsuccessful Foley has to be replaced.   Hypokalemia Replaced  DVT prophylaxis: Lovenox  code Status: Full code Family Communication: None and at bedside. Discussed the plan with his wife over the phone.  Disposition Plan: pending clinical improvement and awaiting biopsy results.    Consultants:   General surgery  Gastroenterology.  Palliative care consult.   Procedures: S/Pex lap, diverting loop descending colostomy - Dr. Ninfa Linden 10/4  Antimicrobials: aztreonam.  And Flagyl since admission.  Completed 8 days of IV antibiotics.  Subjective: Pt seen and examined at bedside with wife.  No nausea, vomiting or abdominal pain.  Small amount of brown stool in bag.   Objective: Vitals:   09/30/19 1045 09/30/19 1050 09/30/19 1107 09/30/19 1450  BP: 99/71 104/75 99/68 102/64  Pulse: 87 88 86   Resp: 18  17 18   Temp:   98.5 F (36.9 C)   TempSrc:   Oral   SpO2: 100% 100% 100%   Weight:      Height:        Intake/Output Summary (Last 24 hours) at 09/30/2019 1818 Last data filed at 09/30/2019 1744 Gross per 24 hour  Intake 3332.52 ml  Output 2020 ml  Net 1312.52 ml   Filed Weights   09/19/19 2200 09/23/19 0850  Weight: 70.3 kg 70.3 kg     Examination:  General exam: alert and comfortable.  Respiratory system: diminished at bases, no wheezing or rhonchi.  Cardiovascular system:  S1s2, RRR, no JVD.  Gastrointestinal system: abd is soft, midly distended, no tenderness, JP drain in place, colostomy bag with small stool  Central nervous system: alert and oriented. Non focal.  Extremities: no pedal edema.  Skin: no rashes seen.  Psychiatry: mood is appropriate.    Data Reviewed: I have personally reviewed following labs and imaging studies  CBC: Recent Labs  Lab 09/24/19 0431 09/25/19 0203 09/26/19 0417 09/28/19 0348 09/30/19 0329  WBC 12.7* 12.0* 13.1* 12.2* 7.4  NEUTROABS 11.5* 10.6* 11.4*  --  6.1  HGB 12.7* 12.9* 12.8* 11.6* 10.4*  HCT 40.0 39.5 39.5 36.5* 33.7*  MCV 95.7 95.6 96.3 97.6 98.8  PLT 466* 413* 416* 306 [id number]   Basic Metabolic Panel: Recent Labs  Lab 09/26/19 0417 09/27/19 0429 09/28/19 0348 09/29/19 0405 09/30/19 0329  NA 153* 152* 150* 146* 139  K 3.5 3.1* 3.4* 3.7 4.0  CL 114* 115* 114* 114* 112*  CO2 28 29 27 25 23   GLUCOSE 157* 141* 127* 127* 127*  BUN 23 17 18 19 16   CREATININE 0.93 0.79 0.72 0.72 0.70  CALCIUM 8.4* 7.9* 8.0* 7.7* 7.4*  MG 1.9 1.9 2.0 1.7 1.8  PHOS 2.3* 3.0 2.5 2.7 2.7   GFR: Estimated Creatinine Clearance: 71.3 mL/min (by C-G formula based on SCr of 0.7 mg/dL). Liver Function Tests: Recent Labs  Lab 09/26/19 0417 09/30/19 0329  AST 29 41  ALT 19 21  ALKPHOS 57 63  BILITOT 0.5 0.4  PROT 5.3* 4.4*  ALBUMIN 1.8* 1.4*   No results for input(s): LIPASE, AMYLASE in the last 168 hours. No results for input(s): AMMONIA in the last 168 hours. Coagulation Profile: Recent Labs  Lab 09/30/19 0329  INR 1.2   Cardiac Enzymes: No results for input(s): CKTOTAL, CKMB, CKMBINDEX, TROPONINI in the last 168 hours. BNP (last 3 results) No results for input(s): PROBNP in the last 8760 hours. HbA1C: No results for input(s): HGBA1C in the last 72  hours. CBG: Recent Labs  Lab 09/30/19 0022 09/30/19 0521 09/30/19 0854 09/30/19 1213 09/30/19 1736  GLUCAP 121* 124* 120* 100* 102*   Lipid Profile: Recent Labs    09/30/19 0329  TRIG 64   Thyroid Function Tests: No results for input(s): TSH, T4TOTAL, FREET4, T3FREE, THYROIDAB in the last 72 hours. Anemia Panel: No results for input(s): VITAMINB12, FOLATE, FERRITIN, TIBC, IRON, RETICCTPCT in the last 72 hours. Sepsis Labs: No results for input(s): PROCALCITON, LATICACIDVEN in the last 168 hours.  No results found for this or any previous visit (from the past 240 hour(s)).       Radiology Studies: US Biopsy (liver)  Result Date: 09/30/2019 INDICATION: 78 year old with a recent perforated bowel and concern for colorectal cancer. History of prostate cancer. Patient has a large liver lesion and need tissue diagnosis. EXAM: ULTRASOUND-GUIDED LIVER LESION BIOPSY MEDICATIONS: None. ANESTHESIA/SEDATION: Moderate (conscious)  sedation was employed during this procedure. A total of Versed 1.0 mg and Fentanyl 50 mcg was administered intravenously. Moderate Sedation Time: 21 minutes. The patient's level of consciousness and vital signs were monitored continuously by radiology nursing throughout the procedure under my direct supervision. FLUOROSCOPY TIME:  None COMPLICATIONS: None immediate. PROCEDURE: Informed written consent was obtained from the patient after a thorough discussion of the procedural risks, benefits and alternatives. All questions were addressed. Maximal Sterile Barrier Technique was utilized including caps, mask, sterile gowns, sterile gloves, sterile drape, hand hygiene and skin antiseptic. A timeout was performed prior to the initiation of the procedure. Liver was evaluated with ultrasound. The hepatic lesion was identified. The right side of the abdomen was prepped with chlorhexidine and sterile field was created. Skin and soft tissues were anesthetized with 1% lidocaine. 26  gauge coaxial needle was directed into the liver lesion with real-time ultrasound guidance. Three core biopsies were obtained with an 18 gauge core device. Gel-Foam slurry was injected along the needle track following the biopsies. Bandage placed over the puncture site. FINDINGS: Trace perihepatic ascites near the hepatic dome. Not enough fluid for a paracentesis. Large hyperechoic liver mass in the right hepatic lobe. Needle position confirmed within the liver lesion. Adequate core biopsies obtained. No significant bleeding or hematoma following the core biopsies. IMPRESSION: Ultrasound-guided core biopsies of the hepatic mass. Electronically Signed   By: Markus Daft M.D.   On: 09/30/2019 13:16        Scheduled Meds:  Chlorhexidine Gluconate Cloth  6 each Topical Daily   [START ON 10/01/2019] enoxaparin (LOVENOX) injection  40 mg Subcutaneous Q24H   Continuous Infusions:  dextrose 5 % and 0.45% NaCl 75 mL/hr at 09/30/19 0533   TPN ADULT (ION)       LOS: 11 days       Hosie Poisson, MD [facility] Pager [phone]   If 7PM-7AM, please contact night-coverage [email or web address] Password [email or web address] 09/30/2019, 6:18 PM

## 2019-09-30 NOTE — Consult Note (Signed)
[facility] Nurse ostomy follow up Stoma type/location: LMQ colostomy Stomal assessment/size: 2 1/4" edematous pink patent and producing soft brown stool Peristomal assessment: intact Treatment options for stomal/peristomal skin: barrier ring and transition to 2 3/4" pouch today. Output soft brown stool Ostomy pouching: 2 pc. 2 3/4" pouch  Education provided: Patient with eyes closed  Does not participate.  Enrolled patient in [employer] Start Discharge program: Yes today

## 2019-09-30 NOTE — Procedures (Signed)
Interventional Radiology Procedure:   Indications: Liver mass.  History of bowel perforation and concern for underlying colorectal cancer.   Procedure: US guided liver lesion biopsy  Findings: Large hyperechoic liver mass, trace perihepatic ascites.  3 cores obtained from lesion.  Gelfoam slurry injected along needle tract and no evidence for bleeding after biopsy.  Complications: None     EBL: Less than 20 ml  Plan: Bedrest 3 hours.   Rhett Najera R. Anselm Pancoast, MD  Pager: [phone]

## 2019-09-30 NOTE — Progress Notes (Signed)
PHARMACY - ADULT TOTAL PARENTERAL NUTRITION CONSULT NOTE   Pharmacy Consult for TPN Indication: prolonged ileus and NPO status  Patient Measurements: Height: 5\' 8"  (172.7 cm) Weight: 155 lb (70.3 kg) IBW/kg (Calculated) : 68.4 TPN AdjBW (KG): 70.3 Body mass index is 23.57 kg/m.  Assessment:  78 year old male presented to the hospital with abdominal pain. Found to have a large bowel obstruction due to possible colorectal cancer vs diverticulitis. S/p ex lap with diverting loop descending colostomy on 10/5. Pt now with ileus and prolonged NPO status since 10/1 so will begin TPN while awaiting return of bowel function.  GI: Prealbumin 9.7>15.4.  NG removed 10/11; 245 ml ostomy output, JP drain 410 mL output. LBM 10/10. CT guided liver biopsy today. Endo: No hx DM - CBGs remains well controlled on SSI Insulin requirements in the past 24 hours: 0 units Lytes: Na 139 (down 7 in 24hrs), Cl improving, K 4 (goal >/= 4), Mg 1.8 (goal >/= 2), Phos 2.7, ~CoCa 9.5 Renal: Clarkson 0.7 -stable, UOP 0.8 mg/kg/hr, D51/2NS at 75 ml/hr - MD adjusting fluids, net -0.38L Pulm: RA Cards: VSS Hepatobil: LFTs + Tbili WNL, TG 64 Neuro: A&Ox4, prn morphine, prn APAP ID: s/p aztreonam + flagyl for IAI - Afebrile, WBC within normal limits.  TPN Access: PICC placed 10/7 TPN start date: 10/7 Nutritional Goals (per RD recommendation on 10/8): KCal: 1800-2000   Protein: 90-105g Fluid: >1.8L  Goal TPN rate is 75 ml/hr (provides 99g protein, 2038 KCal)  Current Nutrition:  Clear liquids - 100% yesterday AM but made NPO for liver biopsy today TPN  Plan:  Continue TPN at goal rate of 84mL/hr - Patient currently NPO/Not at 50% of goal enterally This TPN provides 99g of protein, 324g of dextrose, and 54g of lipids which provides 2038 kCals per day, meeting 100% of patient needs Electrolytes in TPN: continue without sodium and increased K and Mg, 1:2 [id number] Add MVI, trace elements to TPN Discontinue SSI and CBG  checks MIVF per MD - D51/2NS back at 59ml/hr, will monitor output closely and need for eventual rate reduction Monitor TPN labs  F/U toleration of clear liquids  Mg 1 g IV x1   Thank you for involving pharmacy in this patient's care.  Sloan Leiter, PharmD, BCPS, BCCCP Clinical Pharmacist Clinical phone for 09/30/2019 until 3p is [phone] 09/30/2019 8:45 AM  **Pharmacist phone directory can be found on [email or web address] listed under [place]**

## 2019-09-30 NOTE — Progress Notes (Signed)
[facility]/Trauma Progress Note  7 Days Post-Op   Assessment/Plan History of prostate cancer Anemia chronic disease AKI Above per medicine  Large bowel obstruction due to possible colorectal cancerversus diverticulitis POD2 S/Pex lap, diverting loop descending colostomy - Dr. Ninfa Linden 10/4 -Twice daily wet-to-dry dressing changes to midline,continue JP drain Postop ileus -appears to be resolving, NGT out over the weekend -having stool  - AXR10/07 showed ileus -TPNstarted on 10/072/2 ileus and n.p.o. status since 10/1 Liver mass - unable to biopsy intraoperatively  - CT guided liver biopsy by IR   FEN:N.p.o. for IR procedure,TPN,  VTE: SCD's, lovenox [id number] 10/01-10/09; Flagyl 10/5-10/09, WBC 7.4, afebrile Foley:yes 2/2 urinary retention Follow up:Dr. Ninfa Linden  DISPO:IR liver biopsy today?  ileus appears to be resolving   LOS: 11 days    Subjective: CC: no complaints  Pt is resting comfortably. He denies abdominal pain, nausea or vomiting. He is asking what time his IR procedure is today.   Objective: Vital signs in last 24 hours: Temp:  [97.7 F (36.5 C)-98.5 F (36.9 C)] 98.4 F (36.9 C) (10/12 0529) Pulse Rate:  [87-90] 87 (10/12 0529) Resp:  [16-18] 16 (10/12 0529) BP: (104-105)/(72-79) 105/72 (10/12 0529) SpO2:  [100 %] 100 % (10/12 0529) Last BM Date: 09/28/19  Intake/Output from previous day: 10/11 0701 - 10/12 0700 In: 2424.4 [P.O.:420; I.V.:1838.6; IV Piggyback:165.8] Out: 1930 [id number]; Drains:410; Stool:245] Intake/Output this shift: No intake/output data recorded.  PE: Gen: Alert, NAD, sleepy this am HEENT: NG tube out Pulm:Rate andeffort normal Abd: Soft,mild distention,+BS,JP drain, midline incision is clean and without purulent drainage, stoma is large andred,copiousgas,stool in ostomy. no TTP. No peritonitis.  Skin: no rashes noted, warm and dry  Anti-infectives: Anti-infectives  (From admission, onward)   Start     Dose/Rate Route Frequency Ordered Stop   09/25/19 1600  aztreonam (AZACTAM) 2 g in sodium chloride 0.9 % 100 mL IVPB  Status:  Discontinued     2 g 200 mL/hr over 30 Minutes Intravenous Every 8 hours 09/25/19 0938 09/27/19 1900   09/20/19 0800  aztreonam (AZACTAM) 1 g in sodium chloride 0.9 % 100 mL IVPB  Status:  Discontinued     1 g 200 mL/hr over 30 Minutes Intravenous Every 8 hours 09/19/19 2246 09/25/19 0938   09/20/19 0700  metroNIDAZOLE (FLAGYL) IVPB 500 mg  Status:  Discontinued     500 mg 100 mL/hr over 60 Minutes Intravenous Every 8 hours 09/19/19 2310 09/27/19 1900   09/19/19 2245  aztreonam (AZACTAM) 2 g in sodium chloride 0.9 % 100 mL IVPB     2 g 200 mL/hr over 30 Minutes Intravenous  Once 09/19/19 2236 09/19/19 2332   09/19/19 2245  metroNIDAZOLE (FLAGYL) IVPB 500 mg     500 mg 100 mL/hr over 60 Minutes Intravenous  Once 09/19/19 2236 09/20/19 0110      Lab Results:  Recent Labs    09/28/19 0348 09/30/19 0329  WBC 12.2* 7.4  HGB 11.6* 10.4*  HCT 36.5* 33.7*  PLT 306 279   BMET Recent Labs    09/29/19 0405 09/30/19 0329  NA 146* 139  K 3.7 4.0  CL 114* 112*  CO2 25 23  GLUCOSE 127* 127*  BUN 19 16  CREATININE 0.72 0.70  CALCIUM 7.7* 7.4*   PT/INR Recent Labs    09/30/19 0329  LABPROT 15.4*  INR 1.2   CMP     Component Value Date/Time   NA 139 09/30/2019 0329   NA 143 12/27/2012  K 4.0 09/30/2019 0329   CL 112 (H) 09/30/2019 0329   CO2 23 09/30/2019 0329   GLUCOSE 127 (H) 09/30/2019 0329   BUN 16 09/30/2019 0329   BUN 9 12/27/2012   CREATININE 0.70 09/30/2019 0329   CALCIUM 7.4 (L) 09/30/2019 0329   PROT 4.4 (L) 09/30/2019 0329   ALBUMIN 1.4 (L) 09/30/2019 0329   AST 41 09/30/2019 0329   ALT 21 09/30/2019 0329   ALKPHOS 63 09/30/2019 0329   BILITOT 0.4 09/30/2019 0329   GFRNONAA >60 09/30/2019 0329   GFRAA >60 09/30/2019 0329   Lipase     Component Value Date/Time   LIPASE 21 09/19/2019 1658     Studies/Results: No results found.   Kalman Drape, [facility] Pager [phone] Cristine Polio, & Friday 7:00am - 4:30pm Thursdays 7:00am -11:30am  Consults: [phone]

## 2019-09-30 NOTE — Progress Notes (Signed)
PT Cancellation Note  Patient Details Name: Lemont Duel MRN: [id number] DOB: Apr 19, 1941   Cancelled Treatment:    Reason Eval/Treat Not Completed: Patient at procedure or test/unavailable attempted to work with patient but he was out of room at a procedure. Will attempt to return if time/schedule allow.    Deniece Ree PT, DPT, CBIS  Supplemental Physical Therapist [facility]    Pager [phone] Acute Rehab Office [phone]

## 2019-10-01 DIAGNOSIS — Z09 Encounter for follow-up examination after completed treatment for conditions other than malignant neoplasm: Secondary | ICD-10-CM

## 2019-10-01 DIAGNOSIS — Z7189 Other specified counseling: Secondary | ICD-10-CM

## 2019-10-01 LAB — IRON AND TIBC
Iron: 41 ug/dL — ABNORMAL LOW (ref 45–182)
Saturation Ratios: 33 % (ref 17.9–39.5)
TIBC: 126 ug/dL — ABNORMAL LOW (ref 250–450)
UIBC: 85 ug/dL

## 2019-10-01 LAB — CBC
HCT: 32.3 % — ABNORMAL LOW (ref 39.0–52.0)
Hemoglobin: 10.5 g/dL — ABNORMAL LOW (ref 13.0–17.0)
MCH: 31.2 pg (ref 26.0–34.0)
MCHC: 32.5 g/dL (ref 30.0–36.0)
MCV: 95.8 fL (ref 80.0–100.0)
Platelets: 275 10*3/uL (ref 150–400)
RBC: 3.37 MIL/uL — ABNORMAL LOW (ref 4.22–5.81)
RDW: 13.9 % (ref 11.5–15.5)
WBC: 6.5 10*3/uL (ref 4.0–10.5)
nRBC: 0 % (ref 0.0–0.2)

## 2019-10-01 LAB — GLUCOSE, CAPILLARY
Glucose-Capillary: 101 mg/dL — ABNORMAL HIGH (ref 70–99)
Glucose-Capillary: 104 mg/dL — ABNORMAL HIGH (ref 70–99)
Glucose-Capillary: 106 mg/dL — ABNORMAL HIGH (ref 70–99)
Glucose-Capillary: 106 mg/dL — ABNORMAL HIGH (ref 70–99)
Glucose-Capillary: 110 mg/dL — ABNORMAL HIGH (ref 70–99)
Glucose-Capillary: 112 mg/dL — ABNORMAL HIGH (ref 70–99)

## 2019-10-01 LAB — FOLATE: Folate: 10.2 ng/mL (ref >5.9)

## 2019-10-01 LAB — VITAMIN B12: Vitamin B-12: 371 pg/mL (ref 180–914)

## 2019-10-01 LAB — FERRITIN: Ferritin: 140 ng/mL (ref 24–336)

## 2019-10-01 MED ORDER — TRAVASOL 10 % IV SOLN
INTRAVENOUS | Status: AC
  Administered 2019-10-01: 19:00:00 via INTRAVENOUS
  Filled 2019-10-01: qty 594

## 2019-10-01 MED ORDER — ENSURE ENLIVE PO LIQD
237.0000 mL | Freq: Two times a day (BID) | ORAL | Status: DC
  Administered 2019-10-01 – 2019-10-02 (×2): 237 mL via ORAL

## 2019-10-01 NOTE — Progress Notes (Signed)
Physical Therapy Treatment Patient Details Name: Nirvan Laban MRN: [id number] DOB: [birth year] Today's Date: 10/01/2019    History of Present Illness 78yo male c/o general abdominal pain x1 month, abdominal distension, loose stools. CT shows sigmoid abscess vs malignant ulceration and hepatic lesion consistent with malignancy. Received exploratory laparotomy and diverting loop descending colostomy 09/23/19. PMH prostate CA with radiation treatments, COPD, HLD, prostate biopsy    PT Comments    Patient received in bed, pleasant and willing to work with therapy today. Able to complete bed mobility with S-min guard, functional transfers with min guard and RW, and gait approximately 248f with RW and min guard, assist for line management. Steady at self-selected pace with RW. He was left up in the chair with all needs met and family present, all questions and concerns addressed this morning.     Follow Up Recommendations  Home health PT;Supervision for mobility/OOB     Equipment Recommendations  Rolling walker with 5" wheels;3in1 (PT)    Recommendations for Other Services       Precautions / Restrictions Precautions Precautions: Fall Precaution Comments: JP drain, abdominal incision, foley cath Restrictions Weight Bearing Restrictions: No    Mobility  Bed Mobility Overal bed mobility: Needs Assistance Bed Mobility: Rolling;Sidelying to Sit Rolling: Supervision Sidelying to sit: Min guard       General bed mobility comments: use of rail, increased time, no physical assist  Transfers Overall transfer level: Needs assistance Equipment used: Rolling walker (2 wheeled) Transfers: Sit to/from Stand Sit to Stand: Min guard         General transfer comment: min guard for steadying, VC for hand placement  Ambulation/Gait Ambulation/Gait assistance: Min guard Gait Distance (Feet): 200 Feet Assistive device: Rolling walker (2 wheeled) Gait Pattern/deviations: Step-through  pattern;Drifts right/left Gait velocity: decreased   General Gait Details: able to gait train from his room to the window at the end of the unit and back, no signs of fatigue   Stairs             Wheelchair Mobility    Modified Rankin (Stroke Patients Only)       Balance Overall balance assessment: Needs assistance Sitting-balance support: Feet supported Sitting balance-Leahy Scale: Good     Standing balance support: Bilateral upper extremity supported;During functional activity Standing balance-Leahy Scale: Good Standing balance comment: steady with RW                            Cognition Arousal/Alertness: Awake/alert Behavior During Therapy: WFL for tasks assessed/performed Overall Cognitive Status: Within Functional Limits for tasks assessed                                 General Comments: HOH      Exercises      General Comments        Pertinent Vitals/Pain Pain Assessment: No/denies pain Pain Score: 0-No pain Pain Intervention(s): Limited activity within patient's tolerance;Monitored during session    Home Living                      Prior Function            PT Goals (current goals can now be found in the care plan section) Acute Rehab PT Goals Patient Stated Goal: feel better, get warm PT Goal Formulation: With patient Time For Goal Achievement: 10/09/19 Potential to Achieve Goals:  Good Progress towards PT goals: Progressing toward goals    Frequency    Min 3X/week      PT Plan Current plan remains appropriate    Co-evaluation              AM-PAC PT "6 Clicks" Mobility   Outcome Measure  Help needed turning from your back to your side while in a flat bed without using bedrails?: None Help needed moving from lying on your back to sitting on the side of a flat bed without using bedrails?: A Little Help needed moving to and from a bed to a chair (including a wheelchair)?: A Little Help  needed standing up from a chair using your arms (e.g., wheelchair or bedside chair)?: A Little Help needed to walk in hospital room?: A Little Help needed climbing 3-5 steps with a railing? : A Little 6 Click Score: 19    End of Session   Activity Tolerance: Patient tolerated treatment well Patient left: in chair;with call bell/phone within reach   PT Visit Diagnosis: Unsteadiness on feet (R26.81);Muscle weakness (generalized) (M62.81);Difficulty in walking, not elsewhere classified (R26.2)     Time: 8144-8185 PT Time Calculation (min) (ACUTE ONLY): 30 min  Charges:  $Gait Training: 8-22 mins $Therapeutic Activity: 8-22 mins                     Deniece Ree PT, DPT, CBIS  Supplemental Physical Therapist [place]    Pager [phone] Acute Rehab Office [phone]

## 2019-10-01 NOTE — Progress Notes (Signed)
PROGRESS NOTE    Charles Espinoza  [id number] DOB: June 30, 1941 DOA: 09/19/2019 PCP: Rogers Blocker, MD   Brief Narrative:  Male with prior history of prostate cancer s/p radiation in January 2015, COPD presents to ED with generalized abdominal pain for 1 month associated with abdominal distention and watery bowel movements.  CT of the abdomen and pelvis shows volvulus and complex sigmoid collection.  Patient was started on broad-spectrum IV antibiotics and general surgery consulted.  Patient underwent exploratory laparotomy with diverting colostomy on 09/23/2019. Palliative care consulted for goals of care discussion. Family would like to pursue full scope of treatment. Meanwhile pt s nausea, abd pain has improved and NG tube was removed by surgery and started on clears and advanced to full liquid diet. As his bowels have decompressed, IR consulted for liver biopsy . He underwent liver biopsy 09/30/19 and results are pending.  Pt denies any new complaints.  Assessment & Plan:   Principal Problem:   Colonic mass Active Problems:   Malignant neoplasm of prostate (HCC)   Liver mass   Mass of colon   Abdominal distension   Protein-calorie malnutrition, severe   Colon obstruction [facility])   Palliative care encounter   Large bowel obstruction secondary to perforated rectal cancer status post exploratory laparotomy with diverting colostomy on 09/22/2019.  Patient's abdominal distention and abdominal  has improved.  NG tube was discontinued by surgery and he was started on clears and advanced to full liquid diet.   Patient has colostomy bag with some gas and small stool. JP drain is serous fluid.   He remains afebrile, WBC wnl.  He has completed 8 days of IV antibiotics. Pt denies any nausea , vomiting or abdominal pain. Once he is able to tolerate soft diet , he can be discharged home with home health.    Liver mass MRI of the abdomen shows right hepatic lesion favoring metastatic lesion from the  colon cancer IR consulted for CT-guided liver biopsy as his abdominal distention is improving.  He will also need oncology follow-up on discharge. IR performed liver biopsy on 09/30/19. Results are pending.  Once the results show malignancy , please call oncology for follow up ondischarge.    Anemia of chronic disease: Hemoglobin dropped from 12 to 10.5.  Unclear etiology.  No signs of bleeding.  Anemia panel shows low iron .  Iron supplementation added.    History of prostate cancer  Status post XRT in 2015   Acute Kidney injury probably secondary to prerenal/dehydration Creatinine back to baseline with IV fluids.   Hypernatremia Resolved.    Hypophosphatemia:  Repleted.    Nutrition  Taper TPN, as pt is taking oral intake.    Acute urinary retention:  Probably secondary to ileus . Voiding trial was unsuccessful Foley has to be replaced. Plan for voiding trial in am.    Hypokalemia Replaced  DVT prophylaxis: Lovenox  code Status: Full code Family Communication: None and at bedside. Discussed the plan withwife.  Disposition Plan: pending clinical improvement and awaiting biopsy results. Possible d/c home in 1 to 2 days.    Consultants:   General surgery  Gastroenterology.  Palliative care consult.   Procedures: S/Pex lap, diverting loop descending colostomy - Dr. Ninfa Linden 10/4  Antimicrobials: aztreonam.  And Flagyl since admission.  Completed 8 days of IV antibiotics.  Subjective: No nausea, vomiting or abd pain. No fever or chills.   Objective: Vitals:   09/30/19 1107 09/30/19 1450 09/30/19 2051 10/01/19 0452  BP:  99/68 102/64 107/75 104/70  Pulse: 86  81 84  Resp: 18  16 16   Temp: 98.5 F (36.9 C)  98.9 F (37.2 C) (!) 97.5 F (36.4 C)  TempSrc: Oral  Oral Oral  SpO2: 100%  100% 99%  Weight:      Height:        Intake/Output Summary (Last 24 hours) at 10/01/2019 1429 Last data filed at 10/01/2019 0839 Gross per 24 hour  Intake 5616.08  ml  Output 2665 ml  Net 2951.08 ml   Filed Weights   09/19/19 2200 09/23/19 0850  Weight: 70.3 kg 70.3 kg    Examination:  General exam: alert and comfortable.  Respiratory system: air entry fair , no wheezing or rhonchi.  Cardiovascular system:  S1s2, RRR, no JVD.  Gastrointestinal system: abd is soft, non tender, midline incision clean, JP DRAIN IN place with serous fluid. Colostomy bag with stool  Central nervous system: alert and oriented. Non focal Extremities: no pedal edema.  Skin: no rashes seen.  Psychiatry: mood is appropriate.    Data Reviewed: I have personally reviewed following labs and imaging studies  CBC: Recent Labs  Lab 09/25/19 0203 09/26/19 0417 09/28/19 0348 09/30/19 0329 10/01/19 0337  WBC 12.0* 13.1* 12.2* 7.4 6.5  NEUTROABS 10.6* 11.4*  --  6.1  --   HGB 12.9* 12.8* 11.6* 10.4* 10.5*  HCT 39.5 39.5 36.5* 33.7* 32.3*  MCV 95.6 96.3 97.6 98.8 95.8  PLT 413* 416* 306 279 [id number]   Basic Metabolic Panel: Recent Labs  Lab 09/26/19 0417 09/27/19 0429 09/28/19 0348 09/29/19 0405 09/30/19 0329  NA 153* 152* 150* 146* 139  K 3.5 3.1* 3.4* 3.7 4.0  CL 114* 115* 114* 114* 112*  CO2 28 29 27 25 23   GLUCOSE 157* 141* 127* 127* 127*  BUN 23 17 18 19 16   CREATININE 0.93 0.79 0.72 0.72 0.70  CALCIUM 8.4* 7.9* 8.0* 7.7* 7.4*  MG 1.9 1.9 2.0 1.7 1.8  PHOS 2.3* 3.0 2.5 2.7 2.7   GFR: Estimated Creatinine Clearance: 71.3 mL/min (by C-G formula based on SCr of 0.7 mg/dL). Liver Function Tests: Recent Labs  Lab 09/26/19 0417 09/30/19 0329  AST 29 41  ALT 19 21  ALKPHOS 57 63  BILITOT 0.5 0.4  PROT 5.3* 4.4*  ALBUMIN 1.8* 1.4*   No results for input(s): LIPASE, AMYLASE in the last 168 hours. No results for input(s): AMMONIA in the last 168 hours. Coagulation Profile: Recent Labs  Lab 09/30/19 0329  INR 1.2   Cardiac Enzymes: No results for input(s): CKTOTAL, CKMB, CKMBINDEX, TROPONINI in the last 168 hours. BNP (last 3 results) No  results for input(s): PROBNP in the last 8760 hours. HbA1C: No results for input(s): HGBA1C in the last 72 hours. CBG: Recent Labs  Lab 09/30/19 2048 10/01/19 0031 10/01/19 0445 10/01/19 0740 10/01/19 1221  GLUCAP 103* 106* 110* 106* 104*   Lipid Profile: Recent Labs    09/30/19 0329  TRIG 64   Thyroid Function Tests: No results for input(s): TSH, T4TOTAL, FREET4, T3FREE, THYROIDAB in the last 72 hours. Anemia Panel: Recent Labs    10/01/19 0337  VITAMINB12 371  FOLATE 10.2  FERRITIN 140  TIBC 126*  IRON 41*   Sepsis Labs: No results for input(s): PROCALCITON, LATICACIDVEN in the last 168 hours.  No results found for this or any previous visit (from the past 240 hour(s)).       Radiology Studies: US Biopsy (liver)  Result Date: 09/30/2019 INDICATION: 78 year old  with a recent perforated bowel and concern for colorectal cancer. History of prostate cancer. Patient has a large liver lesion and need tissue diagnosis. EXAM: ULTRASOUND-GUIDED LIVER LESION BIOPSY MEDICATIONS: None. ANESTHESIA/SEDATION: Moderate (conscious) sedation was employed during this procedure. A total of Versed 1.0 mg and Fentanyl 50 mcg was administered intravenously. Moderate Sedation Time: 21 minutes. The patient's level of consciousness and vital signs were monitored continuously by radiology nursing throughout the procedure under my direct supervision. FLUOROSCOPY TIME:  None COMPLICATIONS: None immediate. PROCEDURE: Informed written consent was obtained from the patient after a thorough discussion of the procedural risks, benefits and alternatives. All questions were addressed. Maximal Sterile Barrier Technique was utilized including caps, mask, sterile gowns, sterile gloves, sterile drape, hand hygiene and skin antiseptic. A timeout was performed prior to the initiation of the procedure. Liver was evaluated with ultrasound. The hepatic lesion was identified. The right side of the abdomen was prepped  with chlorhexidine and sterile field was created. Skin and soft tissues were anesthetized with 1% lidocaine. 84 gauge coaxial needle was directed into the liver lesion with real-time ultrasound guidance. Three core biopsies were obtained with an 18 gauge core device. Gel-Foam slurry was injected along the needle track following the biopsies. Bandage placed over the puncture site. FINDINGS: Trace perihepatic ascites near the hepatic dome. Not enough fluid for a paracentesis. Large hyperechoic liver mass in the right hepatic lobe. Needle position confirmed within the liver lesion. Adequate core biopsies obtained. No significant bleeding or hematoma following the core biopsies. IMPRESSION: Ultrasound-guided core biopsies of the hepatic mass. Electronically Signed   By: Markus Daft M.D.   On: 09/30/2019 13:16        Scheduled Meds:  Chlorhexidine Gluconate Cloth  6 each Topical Daily   enoxaparin (LOVENOX) injection  40 mg Subcutaneous Q24H   feeding supplement (ENSURE ENLIVE)  237 mL Oral BID BM   Continuous Infusions:  dextrose 5 % and 0.45% NaCl 75 mL/hr at 10/01/19 0509   TPN ADULT (ION) 75 mL/hr at 10/01/19 0509   TPN ADULT (ION)       LOS: 12 days       Hosie Poisson, MD [facility] Pager [phone]   If 7PM-7AM, please contact night-coverage [email or web address] Password [email or web address] 10/01/2019, 2:29 PM

## 2019-10-01 NOTE — TOC Initial Note (Signed)
Transition of Care [facility]) - Initial/Assessment Note    Patient Details  Name: Charles Espinoza MRN: [id number] Date of Birth: 01-30-1941  Transition of Care [facility]) CM/SW Contact:    Marilu Favre, RN Phone Number: 10/01/2019, 3:49 PM  Clinical Narrative:                  Confirmed face sheet information , patient from home with wife.   Patient understands HHRN will not be able to make daily visits , he and his wife will be taught dressing changes and ostomy care prior to discharge.   Patient voices understanding. Expected Discharge Plan: [place] Barriers to Discharge: Continued Medical Work up   Patient Goals and CMS Choice Patient states their goals for this hospitalization and ongoing recovery are:: to go home CMS Medicare.gov Compare Post Acute Care list provided to:: Patient Choice offered to / list presented to : Patient  Expected Discharge Plan and Services Expected Discharge Plan: [place]   Discharge Planning Services: CM Consult Post Acute Care Choice: [place] arrangements for the past 2 months: Single Family Home                 DME Arranged: N/A         HH Arranged: NA          Prior Living Arrangements/Services Living arrangements for the past 2 months: Single Family Home Lives with:: Spouse Patient language and need for interpreter reviewed:: Yes Do you feel safe going back to the place where you live?: Yes      Need for Family Participation in Patient Care: Yes (Comment) Care giver support system in place?: Yes (comment)   Criminal Activity/Legal Involvement Pertinent to Current Situation/Hospitalization: No - Comment as needed  Activities of Daily Living      Permission Sought/Granted   Permission granted to share information with : Yes, Verbal Permission Granted     Permission granted to share info w AGENCY: [facility]        Emotional Assessment Appearance:: Appears stated  age Attitude/Demeanor/Rapport: Engaged Affect (typically observed): Accepting Orientation: : Oriented to Place, Oriented to  Time, Oriented to Situation, Oriented to Self Alcohol / Substance Use: Not Applicable Psych Involvement: No (comment)  Admission diagnosis:  Abdominal distension [R14.0] Liver mass [R16.0] Colonic mass [K63.89] Patient Active Problem List   Diagnosis Date Noted  . Colon obstruction ([place])   . Palliative care encounter   . Protein-calorie malnutrition, severe 09/26/2019  . Abdominal distension   . Liver mass 09/19/2019  . Colonic mass 09/19/2019  . Mass of colon 09/19/2019  . Malignant neoplasm of prostate ([place]) 08/15/2013   PCP:  Rogers Blocker, MD Pharmacy:   [facility] [place], [place] AT [address] [place] [address] Phone: [phone] Fax: [phone]     Social Determinants of Health (SDOH) Interventions    Readmission Risk Interventions No flowsheet data found.

## 2019-10-01 NOTE — Progress Notes (Signed)
Nutrition Follow-up  RD working remotely.  DOCUMENTATION CODES:   Severe malnutrition in context of chronic illness  INTERVENTION:   -TPN management per pharmacy -Ensure Enlive po BID, each supplement provides 350 kcal and 20 grams of protein -RD will follow for diet advancement and adjust supplement regimen as appropriate  NUTRITION DIAGNOSIS:   Severe Malnutrition related to chronic illness(rectal cancer) as evidenced by severe muscle depletion, severe fat depletion, moderate fat depletion, moderate muscle depletion.  Ongoing  GOAL:   Patient will meet greater than or equal to 90% of their needs  Progressing; currently meeting 100% of needs via TPN (to be reduced tonight)  MONITOR:   Diet advancement, Labs, Weight trends, Skin, I & O's  REASON FOR ASSESSMENT:   Consult New TPN/TNA  ASSESSMENT:   Charles Espinoza is a 78 y.o. male with medical history significant of prostate CA s/p radiation finished back in Jan 2015.Patient presents to the ED with c/o generalized abd pain for the past 1 month.  Worsening especially over the past 1 eek with abd distention.  Pain around umbilicus today.  Constant symptoms.  Loose watery stool with one episode earlier this AM.  Is passing gas still.  10/2- MRI suggestive for colon mass (malignant ulceration) liver lesion worrisome for malignancy 10/3- NGT placed 10/4- per general surgery notes, likely perforated rectal cancer that has been leaking air and liver mass 10/5- CAT scan revealed likely rectal cancer with mets to liver and persistent obstruction; s/pProcedure(s): EXPLORATORY LAPAROTOMY;DIVERTING LOOP DESCENDING COLOSTOMY 10/6- NGT clamping trials started, per RN tolerated well 10/7- NGT resumed to low, intermittent suction, PICC placed, TPN initiated 10/11- NGT removed, advanced to clear liquids 10/12- s/p US guided liver lesion biopsy  Reviewed I/O's: +3 L x 24 hours and +2.6 L since admission  UOP: 2 L x 24 hours  Drain  output: 435 ml x 24 hours  Colostomy output: 75 ml x 24 hours  Pt just advanced to full liquid diet. Noted poor oral intake (PO: 0%). Pt was requesting Ensure supplements during prior visit. RD to order.   Liver biopsy results pending.   Pt remains on TPN- currently receiving @ 75 ml/hr, which provides 2038 kcals and 99 grams protein, which meeting 100% of estimated protein needs. Per pharmacy notes, plan to decrease TPN to 45 ml/hr, which provides 1222 kcals and 59 grams protein, meeting 68% of estimated kcal needs and 56% of estimated protein needs.   Medications reviewed and include dextrose 5%-0.45% sodium chloride infusion @ 75 ml/hr.   Labs reviewed: CBGS: 106-110.   Diet Order:   Diet Order            Diet full liquid Room service appropriate? Yes; Fluid consistency: Thin  Diet effective now              EDUCATION NEEDS:   Education needs have been addressed  Skin:  Skin Assessment: Skin Integrity Issues: Skin Integrity Issues:: Incisions Incisions: closed abdomen  Last BM:  09/30/19 (75 ml output via colostomy)  Height:   Ht Readings from Last 1 Encounters:  09/23/19 5\' 8"  (1.727 m)    Weight:   Wt Readings from Last 1 Encounters:  09/23/19 70.3 kg    Ideal Body Weight:  70 kg  BMI:  Body mass index is 23.57 kg/m.  Estimated Nutritional Needs:   Kcal:  1800-2000  Protein:  90-105 grams  Fluid:  > 1.8 L    Franca Stakes A. Jimmye Norman, RD, LDN, [place] Registered Dietitian II Certified  Diabetes Care and Education Specialist Pager: [phone] After hours Pager: [phone]

## 2019-10-01 NOTE — Progress Notes (Signed)
PHARMACY - ADULT TOTAL PARENTERAL NUTRITION CONSULT NOTE   Pharmacy Consult for TPN Indication: prolonged ileus and NPO status  Patient Measurements: Height: 5\' 8"  (172.7 cm) Weight: 155 lb (70.3 kg) IBW/kg (Calculated) : 68.4 TPN AdjBW (KG): 70.3 Body mass index is 23.57 kg/m.  Assessment:  78 year old male presented to the hospital with abdominal pain. Found to have a large bowel obstruction due to possible colorectal cancer vs diverticulitis. S/p ex lap with diverting loop descending colostomy on 10/5. Pt now with ileus and prolonged NPO status since 10/1 so will begin TPN while awaiting return of bowel function.  GI: Prealbumin 9.7>15.4.  NG removed 10/11; 75 ml ostomy output, JP drain 435 mL output. LBM 10/12. CT guided liver biopsy today. Endo: No hx DM - CBGs remains well controlled on SSI Insulin requirements in the past 24 hours: 0 units Lytes: Na 139 (down 7 in 24hrs), Cl improving, K 4 (goal >/= 4), Mg 1.8 (goal >/= 2), Phos 2.7, ~CoCa 9.5 Renal: [place] 0.7 -stable, UOP 1.2 mg/kg/hr, D51/2NS at 75 ml/hr - MD adjusting fluids, net +2L Pulm: RA Cards: VSS Hepatobil: LFTs + Tbili WNL, TG 64 Neuro: A&Ox4, prn morphine, prn APAP ID: s/p aztreonam + flagyl for IAI - Afebrile, WBC within normal limits.  TPN Access: PICC placed 10/7 TPN start date: 10/7 Nutritional Goals (per RD recommendation on 10/8): KCal: 1800-2000   Protein: 90-105g Fluid: >1.8L  Goal TPN rate is 75 ml/hr (provides 99g protein, 2038 KCal)  Current Nutrition:  Full liquids -0% intake this AM (NPO yesterday for biopsy) TPN  Plan:  Reduce TPN to 45 mL/hr (Surgery requesting to wean TPN, confirmed with PA - will reduce volume and may help stimulate appetite) This TPN provides 59g of protein, 194g of dextrose, and 32g of lipids which provides  1222 kCals per day, meeting ~65% of patient needs Electrolytes in TPN: continue without sodium and increased K and Mg, 1:2 [id number] Add MVI, trace elements to  TPN Discontinue SSI and CBG checks MIVF per Primary MD - D51/2NS back at 57ml/hr, currently Net +2L - weaning TPN will help reduce volume, f/up fluid status Monitor TPN labs  F/U toleration of enteral diet   Thank you for involving pharmacy in this patient's care.  Sloan Leiter, PharmD, BCPS, BCCCP Clinical Pharmacist Clinical phone for 10/01/2019 until 3p is [phone] 10/01/2019 9:02 AM  **Pharmacist phone directory can be found on [email or web address] listed under [place]**

## 2019-10-01 NOTE — Progress Notes (Signed)
Daily Progress Note   Patient Name: Charles Espinoza       Date: 10/01/2019 DOB: 08-Sep-1941  Age: 78 y.o. MRN#: [id number] Attending Physician: Hosie Poisson, MD Primary Care Physician: Rogers Blocker, MD Admit Date: 09/19/2019  Reason for Consultation/Follow-up: Establishing goals of care and Psychosocial/spiritual support  Subjective: Patient awake, alert & oriented x4 in bed. He is watching tv. Denies pain or shortness of breath. States he is anxiously waiting to have biopsy results and to follow up with Oncology to better understand what treatment options are available. He is waiting on his breakfast tray. Reports he feels as though his appetite is slowly coming back as he woke up this morning "hungry as a horse". Denies pain or nausea when eating. He states "as good as I'm feeling I am hoping this means there is hope!"   He shares his goals are to return home with his wife, family, and home health care  support. He reports he knows he will need some assistance while trying to regain his strength.   Colostomy bag in place, with small amount of stool. We discussed outpatient palliative and possibly following up with outpatient palliative at discharge. He is in agreement for continued palliative support.   Length of Stay: 12  Current Medications: Scheduled Meds:  . Chlorhexidine Gluconate Cloth  6 each Topical Daily  . enoxaparin (LOVENOX) injection  40 mg Subcutaneous Q24H    Continuous Infusions: . dextrose 5 % and 0.45% NaCl 75 mL/hr at 10/01/19 0509  . TPN ADULT (ION) 75 mL/hr at 10/01/19 0509  . TPN ADULT (ION)      PRN Meds: acetaminophen **OR** acetaminophen, morphine injection, ondansetron **OR** ondansetron (ZOFRAN) IV, phenol, sodium chloride flush  Physical Exam      -awake,  A&O x4, NAD -RRR -mood appropriate     Vital Signs: BP 104/70 (BP Location: Left Arm)   Pulse 84   Temp (!) 97.5 F (36.4 C) (Oral)   Resp 16   Ht 5\' 8"  (1.727 m)   Wt 70.3 kg   SpO2 99%   BMI 23.57 kg/m  SpO2: SpO2: 99 % O2 Device: O2 Device: Room Air O2 Flow Rate: O2 Flow Rate (L/min): 2 L/min  Intake/output summary:   Intake/Output Summary (Last 24 hours) at 10/01/2019 1046 Last data filed at 10/01/[phone] [id number]  Gross per 24 hour  Intake 5616.08 ml  Output 3205 ml  Net 2411.08 ml   LBM: Last BM Date: (P) 09/30/19 Baseline Weight: Weight: 70.3 kg Most recent weight: Weight: 70.3 kg       Palliative Assessment/Data:PPS 40%      Patient Active Problem List   Diagnosis Date Noted  . Colon obstruction ([place])   . Palliative care encounter   . Protein-calorie malnutrition, severe 09/26/2019  . Abdominal distension   . Liver mass 09/19/2019  . Colonic mass 09/19/2019  . Mass of colon 09/19/2019  . Malignant neoplasm of prostate ([place]) 08/15/2013    Palliative Care Assessment & Plan   Recommendations/Plan:  Continued full code full scope  Hopeful for treatment options per Oncology team once biopsy results are resulted. Goals are clear he wishes to pursue all aggressive and available treatments.  Concerned on how well he will tolerate treatment given his malnutrition and deconditioning. Will benefit from OP Palliative at the [place] with Wadie Lessen, NP if he is going to undergo treatments regularly.   PMT will continue to support and follow as needed.  Goals of Care and Additional Recommendations:  Limitations on Scope of Treatment: Full Scope Treatment  Code Status:    Code Status Orders  (From admission, onward)         Start     Ordered   09/19/19 2306  Full code  Continuous     09/19/19 2310        Code Status History    This patient has a current code status but no historical code status.   Advance Care Planning Activity      Prognosis:   < 6 months per Haynes Dage, [place]: Concerned that he may have less than 6 months given his metastatic colon cancer.  I'm uncertain he'll be strong enough for chemotherapy  Discharge Planning:  Home with Home Health and Palliative follow up at [facility].   Care plan was discussed with patient.   Thank you for allowing the Palliative Medicine Team to assist in the care of this patient.  Total Time: 25 min.   Greater than 50%  of this time was spent counseling and coordinating care related to the above assessment and plan.  Alda Lea, AGPCNP-BC Palliative Medicine Team Pager: [phone]   Please contact Palliative Medicine Team phone at [phone] for questions and concerns.

## 2019-10-01 NOTE — Progress Notes (Signed)
[facility]/Trauma Progress Note  8 Days Post-Op   Assessment/Plan History of prostate cancer Anemia chronic disease AKI Above per medicine  Large bowel obstruction due to possible colorectal cancerversus diverticulitis - S/Pex lap, diverting loop descending colostomy - Dr. Ninfa Linden 10/4 -Twice daily wet-to-dry dressing changes to midline,continue JP drain Postop ileus -appears to be resolving, having stool  - AXR10/07 showed ileus -TPNstarted on 10/072/2 ileus and n.p.o. status since 10/1, okay to start to wean Liver mass - unable to biopsy intraoperatively  - S/P CT guided liver biopsy by IR 10/12  FEN:FLD,start to wean TPN  VTE: SCD's, lovenox [id number] 10/01-10/09; Flagyl 10/5-10/09, WBC 6.5, afebrile Foley:yes 2/2 urinary retention, per medicine  Follow up:Dr. Ninfa Linden  DISPO:ileus appears to be resolving, advance diet as tolerated   LOS: 12 days    Subjective: CC: no complaints  Pt has not been walking. I encouraged him to walk today. He denies abdominal pain, nausea or vomiting.   Objective: Vital signs in last 24 hours: Temp:  [97.5 F (36.4 C)-98.9 F (37.2 C)] 97.5 F (36.4 C) (10/13 0452) Pulse Rate:  [80-90] 84 (10/13 0452) Resp:  [14-27] 16 (10/13 0452) BP: (99-108)/(64-77) 104/70 (10/13 0452) SpO2:  [99 %-100 %] 99 % (10/13 0452) Last BM Date: 09/30/19  Intake/Output from previous day: 10/12 0701 - 10/13 0700 In: 5516.1 [P.O.:120; I.V.:5296.1; IV Piggyback:100] Out: 2485 [Urine:1975; Drains:435; Stool:75] Intake/Output this shift: Total I/O In: 100 [P.O.:100] Out: 34 [Urine:650; Drains:70]  PE: Gen: Alert, NAD,cooperative, pleasant Pulm:Rate andeffort normal Abd: Soft,mild distention,+BS,JP drain is serous, midline incision is clean and without purulent drainage, stoma is large andred,copiousgas,small amt of liquid stool in ostomy. no TTP.No peritonitis.  Skin: no rashes noted, warm and  dry    Anti-infectives: Anti-infectives (From admission, onward)   Start     Dose/Rate Route Frequency Ordered Stop   09/25/19 1600  aztreonam (AZACTAM) 2 g in sodium chloride 0.9 % 100 mL IVPB  Status:  Discontinued     2 g 200 mL/hr over 30 Minutes Intravenous Every 8 hours 09/25/19 0938 09/27/19 1900   09/20/19 0800  aztreonam (AZACTAM) 1 g in sodium chloride 0.9 % 100 mL IVPB  Status:  Discontinued     1 g 200 mL/hr over 30 Minutes Intravenous Every 8 hours 09/19/19 2246 09/25/19 0938   09/20/19 0700  metroNIDAZOLE (FLAGYL) IVPB 500 mg  Status:  Discontinued     500 mg 100 mL/hr over 60 Minutes Intravenous Every 8 hours 09/19/19 2310 09/27/19 1900   09/19/19 2245  aztreonam (AZACTAM) 2 g in sodium chloride 0.9 % 100 mL IVPB     2 g 200 mL/hr over 30 Minutes Intravenous  Once 09/19/19 2236 09/19/19 2332   09/19/19 2245  metroNIDAZOLE (FLAGYL) IVPB 500 mg     500 mg 100 mL/hr over 60 Minutes Intravenous  Once 09/19/19 2236 09/20/19 0110      Lab Results:  Recent Labs    09/30/19 0329 10/01/19 0337  WBC 7.4 6.5  HGB 10.4* 10.5*  HCT 33.7* 32.3*  PLT 279 275   BMET Recent Labs    09/29/19 0405 09/30/19 0329  NA 146* 139  K 3.7 4.0  CL 114* 112*  CO2 25 23  GLUCOSE 127* 127*  BUN 19 16  CREATININE 0.72 0.70  CALCIUM 7.7* 7.4*   PT/INR Recent Labs    09/30/19 0329  LABPROT 15.4*  INR 1.2   CMP     Component Value Date/Time   NA 139  09/30/2019 0329   NA 143 12/27/2012   K 4.0 09/30/2019 0329   CL 112 (H) 09/30/2019 0329   CO2 23 09/30/2019 0329   GLUCOSE 127 (H) 09/30/2019 0329   BUN 16 09/30/2019 0329   BUN 9 12/27/2012   CREATININE 0.70 09/30/2019 0329   CALCIUM 7.4 (L) 09/30/2019 0329   PROT 4.4 (L) 09/30/2019 0329   ALBUMIN 1.4 (L) 09/30/2019 0329   AST 41 09/30/2019 0329   ALT 21 09/30/2019 0329   ALKPHOS 63 09/30/2019 0329   BILITOT 0.4 09/30/2019 0329   GFRNONAA >60 09/30/2019 0329   GFRAA >60 09/30/2019 0329   Lipase     Component  Value Date/Time   LIPASE 21 09/19/2019 1658    Studies/Results: US Biopsy (liver)  Result Date: 09/30/2019 INDICATION: 78 year old with a recent perforated bowel and concern for colorectal cancer. History of prostate cancer. Patient has a large liver lesion and need tissue diagnosis. EXAM: ULTRASOUND-GUIDED LIVER LESION BIOPSY MEDICATIONS: None. ANESTHESIA/SEDATION: Moderate (conscious) sedation was employed during this procedure. A total of Versed 1.0 mg and Fentanyl 50 mcg was administered intravenously. Moderate Sedation Time: 21 minutes. The patient's level of consciousness and vital signs were monitored continuously by radiology nursing throughout the procedure under my direct supervision. FLUOROSCOPY TIME:  None COMPLICATIONS: None immediate. PROCEDURE: Informed written consent was obtained from the patient after a thorough discussion of the procedural risks, benefits and alternatives. All questions were addressed. Maximal Sterile Barrier Technique was utilized including caps, mask, sterile gowns, sterile gloves, sterile drape, hand hygiene and skin antiseptic. A timeout was performed prior to the initiation of the procedure. Liver was evaluated with ultrasound. The hepatic lesion was identified. The right side of the abdomen was prepped with chlorhexidine and sterile field was created. Skin and soft tissues were anesthetized with 1% lidocaine. 19 gauge coaxial needle was directed into the liver lesion with real-time ultrasound guidance. Three core biopsies were obtained with an 18 gauge core device. Gel-Foam slurry was injected along the needle track following the biopsies. Bandage placed over the puncture site. FINDINGS: Trace perihepatic ascites near the hepatic dome. Not enough fluid for a paracentesis. Large hyperechoic liver mass in the right hepatic lobe. Needle position confirmed within the liver lesion. Adequate core biopsies obtained. No significant bleeding or hematoma following the core  biopsies. IMPRESSION: Ultrasound-guided core biopsies of the hepatic mass. Electronically Signed   By: Markus Daft M.D.   On: 09/30/2019 13:16     Kalman Drape, [facility] Surgery Pager [phone] Cristine Polio, & Friday 7:00am - 4:30pm Thursdays 7:00am -11:30am

## 2019-10-02 LAB — GLUCOSE, CAPILLARY
Glucose-Capillary: 102 mg/dL — ABNORMAL HIGH (ref 70–99)
Glucose-Capillary: 104 mg/dL — ABNORMAL HIGH (ref 70–99)
Glucose-Capillary: 105 mg/dL — ABNORMAL HIGH (ref 70–99)
Glucose-Capillary: 106 mg/dL — ABNORMAL HIGH (ref 70–99)
Glucose-Capillary: 115 mg/dL — ABNORMAL HIGH (ref 70–99)
Glucose-Capillary: 123 mg/dL — ABNORMAL HIGH (ref 70–99)

## 2019-10-02 MED ORDER — POLYETHYLENE GLYCOL 3350 17 G PO PACK
17.0000 g | PACK | Freq: Every day | ORAL | Status: DC
  Administered 2019-10-02 – 2019-10-05 (×4): 17 g via ORAL
  Filled 2019-10-02 (×4): qty 1

## 2019-10-02 MED ORDER — DOCUSATE SODIUM 100 MG PO CAPS
100.0000 mg | ORAL_CAPSULE | Freq: Every day | ORAL | Status: DC
  Administered 2019-10-02 – 2019-10-05 (×4): 100 mg via ORAL
  Filled 2019-10-02 (×4): qty 1

## 2019-10-02 MED ORDER — TRAVASOL 10 % IV SOLN
INTRAVENOUS | Status: AC
  Administered 2019-10-02: 18:00:00 via INTRAVENOUS
  Filled 2019-10-02: qty 594

## 2019-10-02 MED ORDER — ENSURE ENLIVE PO LIQD
237.0000 mL | Freq: Three times a day (TID) | ORAL | Status: DC
  Administered 2019-10-02 – 2019-10-05 (×4): 237 mL via ORAL

## 2019-10-02 NOTE — Progress Notes (Signed)
Physical Therapy Treatment Patient Details Name: Tami Barren MRN: [id number] DOB: March 14, 1941 Today's Date: 10/02/2019    History of Present Illness 78yo male c/o general abdominal pain x1 month, abdominal distension, loose stools. CT shows sigmoid abscess vs malignant ulceration and hepatic lesion consistent with malignancy. Received exploratory laparotomy and diverting loop descending colostomy 09/23/19. PMH prostate CA with radiation treatments, COPD, HLD, prostate biopsy    PT Comments    Patient received in bed, reports feeling much better and willing to participate in PT today. Able to roll with S, performed sidelying to sit with min guard, also functional transfers with RW and gait approximately 279f with RW and min guard. Introduced sIT trainer required MinA for balance and to stabilize RW, cues for safety and sequencing during stair navigation. Provided handout for use of RW on stairs at home. He was left up in the chair with all needs met and IV team present and attending, all other needs met this morning.     Follow Up Recommendations  Home health PT;Supervision for mobility/OOB     Equipment Recommendations  Rolling walker with 5" wheels;3in1 (PT)    Recommendations for Other Services       Precautions / Restrictions Precautions Precautions: Fall Precaution Comments: JP drain, abdominal incision, foley cath Restrictions Weight Bearing Restrictions: No    Mobility  Bed Mobility Overal bed mobility: Needs Assistance Bed Mobility: Rolling;Sidelying to Sit Rolling: Supervision Sidelying to sit: Supervision       General bed mobility comments: improved mobilty today, S for safety and line management  Transfers Overall transfer level: Needs assistance Equipment used: Rolling walker (2 wheeled) Transfers: Sit to/from Stand Sit to Stand: Min guard         General transfer comment: min guard for steadying, VC for hand  placement  Ambulation/Gait Ambulation/Gait assistance: Min guard Gait Distance (Feet): 250 Feet Assistive device: Rolling walker (2 wheeled) Gait Pattern/deviations: Step-through pattern;Drifts right/left Gait velocity: decreased   General Gait Details: gait to stairway in hall wtih RW and min guard, gait speed improved considerably today   Stairs Stairs: Yes Stairs assistance: Min assist Stair Management: Backwards;Forwards;With walker Number of Stairs: 3 General stair comments: backwards ascent/forwards descent with RW and MinA to stabilize RW and maintain balance   Wheelchair Mobility    Modified Rankin (Stroke Patients Only)       Balance Overall balance assessment: Needs assistance Sitting-balance support: Feet supported Sitting balance-Leahy Scale: Normal     Standing balance support: Bilateral upper extremity supported;During functional activity Standing balance-Leahy Scale: Good Standing balance comment: steady with RW                            Cognition Arousal/Alertness: Awake/alert Behavior During Therapy: WFL for tasks assessed/performed Overall Cognitive Status: Within Functional Limits for tasks assessed                                 General Comments: HOH      Exercises      General Comments        Pertinent Vitals/Pain Pain Assessment: No/denies pain Pain Score: 0-No pain Pain Intervention(s): Limited activity within patient's tolerance;Monitored during session    Home Living                      Prior Function  PT Goals (current goals can now be found in the care plan section) Acute Rehab PT Goals Patient Stated Goal: feel better, get warm PT Goal Formulation: With patient Time For Goal Achievement: 10/09/19 Potential to Achieve Goals: Good Progress towards PT goals: Progressing toward goals    Frequency    Min 3X/week      PT Plan Current plan remains appropriate     Co-evaluation              AM-PAC PT "6 Clicks" Mobility   Outcome Measure  Help needed turning from your back to your side while in a flat bed without using bedrails?: None Help needed moving from lying on your back to sitting on the side of a flat bed without using bedrails?: A Little Help needed moving to and from a bed to a chair (including a wheelchair)?: A Little Help needed standing up from a chair using your arms (e.g., wheelchair or bedside chair)?: A Little Help needed to walk in hospital room?: A Little Help needed climbing 3-5 steps with a railing? : A Little 6 Click Score: 19    End of Session   Activity Tolerance: Patient tolerated treatment well Patient left: in chair;with call bell/phone within reach   PT Visit Diagnosis: Unsteadiness on feet (R26.81);Muscle weakness (generalized) (M62.81);Difficulty in walking, not elsewhere classified (R26.2)     Time: 5686-1683 PT Time Calculation (min) (ACUTE ONLY): 27 min  Charges:  $Gait Training: 23-37 mins                     Deniece Ree PT, DPT, CBIS  Supplemental Physical Therapist [place]    Pager [phone] Acute Rehab Office [phone]

## 2019-10-02 NOTE — Progress Notes (Signed)
PHARMACY - ADULT TOTAL PARENTERAL NUTRITION CONSULT NOTE  Pharmacy Consult:  TPN Indication:  Prolonged ileus  Patient Measurements: Height: 5\' 8"  (172.7 cm) Weight: 155 lb (70.3 kg) IBW/kg (Calculated) : 68.4 TPN AdjBW (KG): 70.3 Body mass index is 23.57 kg/m.  Assessment:  Charles Espinoza presented with abdominal pain, found to have a SBO due to possible colorectal cancer vs diverticulitis. S/p ex-lap with diverting loop descending colostomy on 10/5. Patient with post-op ileus since 10/1, so TPN started while awaiting return of bowel function.  GI: prealbumin up to 15.4, ostomy O/P 48mL, JP drain 123mL (serous), LBM 10/13  CT guided liver biopsy 10/12 Endo: No hx DM - CBGs well controlled, SSI d/c'ed 10/01/19 Insulin requirements in the past 24 hours: 0 units Lytes: 10/12 labs - Na 139, K 4 (goal >/= 4), Mg 1.8 (goal >/= 2), Phos 2.7, ~CoCa 9.5 Renal: [place] 0.7, BUN WNL - UOP 1 mg/kg/hr, D51/2NS at 75 ml/hr, net +4.2L Pulm: stable on RA Cards: VSS Hepatobil: LFTs / tbili / TG WNL Neuro: PRN morphine ID: s/p Azactam/Flagyl for IAI - afebrile, WBC WNL TPN Access: PICC placed 09/25/19 TPN start date: 09/25/19  Nutritional Goals (per RD rec on 10/8): 1800-2000 kCal, 90-105gm protein, >1.8L fluid per day  Current Nutrition:  TPN No intake charted on full liquid diet >> advanced to soft diet per Surgery's note Ensure Enlive BID - 1 charted given  Plan:  Continue TPN at 45 ml/hr (goal rate 75 ml/hr) TPN provides 59g AA, 194g CHO and 32g ILE for a total of 1222 kCal, meeting ~65% of patient needs Electrolytes in TPN: Cl:Ac 1:2 - no change today Daily multivitamin and trace elements in TPN D51/2NS at 75 ml/hr per MD - consider stopping F/U toleration of enteral diet to wean off TPN, AM labs, discharge plans  Creedon Danielski D. Mina Marble, PharmD, BCPS, [place] 10/02/2019, 11:32 AM

## 2019-10-02 NOTE — Progress Notes (Addendum)
[facility]/Trauma Progress Note  9 Days Post-Op   Assessment/Plan History of prostate cancer Anemia chronic disease AKI Above per medicine  Large bowel obstruction due to possible colorectal cancerversus diverticulitis - S/Pex lap, diverting loop descending colostomy - Dr. Ninfa Linden 10/4 -Twice daily wet-to-dry dressing changes to midline,continue JP drain Postop ileus -appears to be resolving, having stool - AXR10/07 showed ileus -TPNstarted on 10/072/2 ileus and n.p.o. status since 10/1, okay to start to wean Liver mass - unable to biopsy intraoperatively  - S/P CT guided liver biopsy by IR 10/12  FEN:FLD,start to wean TPN VTE: SCD's, lovenox [id number] 10/01-10/09;Flagyl 10/5-10/09, WBC6.5,afebrile Foley:yes 2/2 urinary retention, per medicine  Follow up:Dr. Ninfa Linden  DISPO:Advancing diet to soft.  Patient is okay for discharge from our standpoint.  JP drain output too high to remove but serous.  Liver biopsy pending. Bowel regiment.   LOS: 13 days    Subjective: CC: No complaints  Patient denies nausea, vomiting, fever or chills.  Very mild abdominal pain overnight.  Objective: Vital signs in last 24 hours: Temp:  [98.1 F (36.7 C)-99 F (37.2 C)] 98.6 F (37 C) (10/14 0540) Pulse Rate:  [78-92] 78 (10/14 0540) Resp:  [16-20] 20 (10/14 0540) BP: (99-108)/(65-73) 106/66 (10/14 0540) SpO2:  [100 %] 100 % (10/14 0540) Last BM Date: 10/01/19  Intake/Output from previous day: 10/13 0701 - 10/14 0700 In: 3539.2 [P.O.:500; I.V.:3039.2] Out: 1890 [id number]; Drains:160; Stool:55] Intake/Output this shift: No intake/output data recorded.  PE: Gen: Alert, NAD,cooperative, pleasant Pulm:Rate andeffort normal Abd: Soft,mild distention,+BS,JP drain, midline incision is clean and without purulent drainage, stoma is large andred,copiousgas,small amt of liquid stool in ostomy. no TTP.No peritonitis. Skin: no rashes  noted, warm and dry   Anti-infectives: Anti-infectives (From admission, onward)   Start     Dose/Rate Route Frequency Ordered Stop   09/25/19 1600  aztreonam (AZACTAM) 2 g in sodium chloride 0.9 % 100 mL IVPB  Status:  Discontinued     2 g 200 mL/hr over 30 Minutes Intravenous Every 8 hours 09/25/19 0938 09/27/19 1900   09/20/19 0800  aztreonam (AZACTAM) 1 g in sodium chloride 0.9 % 100 mL IVPB  Status:  Discontinued     1 g 200 mL/hr over 30 Minutes Intravenous Every 8 hours 09/19/19 2246 09/25/19 0938   09/20/19 0700  metroNIDAZOLE (FLAGYL) IVPB 500 mg  Status:  Discontinued     500 mg 100 mL/hr over 60 Minutes Intravenous Every 8 hours 09/19/19 2310 09/27/19 1900   09/19/19 2245  aztreonam (AZACTAM) 2 g in sodium chloride 0.9 % 100 mL IVPB     2 g 200 mL/hr over 30 Minutes Intravenous  Once 09/19/19 2236 09/19/19 2332   09/19/19 2245  metroNIDAZOLE (FLAGYL) IVPB 500 mg     500 mg 100 mL/hr over 60 Minutes Intravenous  Once 09/19/19 2236 09/20/19 0110      Lab Results:  Recent Labs    09/30/19 0329 10/01/19 0337  WBC 7.4 6.5  HGB 10.4* 10.5*  HCT 33.7* 32.3*  PLT 279 275   BMET Recent Labs    09/30/19 0329  NA 139  K 4.0  CL 112*  CO2 23  GLUCOSE 127*  BUN 16  CREATININE 0.70  CALCIUM 7.4*   PT/INR Recent Labs    09/30/19 0329  LABPROT 15.4*  INR 1.2   CMP     Component Value Date/Time   NA 139 09/30/2019 0329   NA 143 12/27/2012   K 4.0 09/30/2019 0329  CL 112 (H) 09/30/2019 0329   CO2 23 09/30/2019 0329   GLUCOSE 127 (H) 09/30/2019 0329   BUN 16 09/30/2019 0329   BUN 9 12/27/2012   CREATININE 0.70 09/30/2019 0329   CALCIUM 7.4 (L) 09/30/2019 0329   PROT 4.4 (L) 09/30/2019 0329   ALBUMIN 1.4 (L) 09/30/2019 0329   AST 41 09/30/2019 0329   ALT 21 09/30/2019 0329   ALKPHOS 63 09/30/2019 0329   BILITOT 0.4 09/30/2019 0329   GFRNONAA >60 09/30/2019 0329   GFRAA >60 09/30/2019 0329   Lipase     Component Value Date/Time   LIPASE 21  09/19/2019 1658    Studies/Results: US Biopsy (liver)  Result Date: 09/30/2019 INDICATION: 78 year old with a recent perforated bowel and concern for colorectal cancer. History of prostate cancer. Patient has a large liver lesion and need tissue diagnosis. EXAM: ULTRASOUND-GUIDED LIVER LESION BIOPSY MEDICATIONS: None. ANESTHESIA/SEDATION: Moderate (conscious) sedation was employed during this procedure. A total of Versed 1.0 mg and Fentanyl 50 mcg was administered intravenously. Moderate Sedation Time: 21 minutes. The patient's level of consciousness and vital signs were monitored continuously by radiology nursing throughout the procedure under my direct supervision. FLUOROSCOPY TIME:  None COMPLICATIONS: None immediate. PROCEDURE: Informed written consent was obtained from the patient after a thorough discussion of the procedural risks, benefits and alternatives. All questions were addressed. Maximal Sterile Barrier Technique was utilized including caps, mask, sterile gowns, sterile gloves, sterile drape, hand hygiene and skin antiseptic. A timeout was performed prior to the initiation of the procedure. Liver was evaluated with ultrasound. The hepatic lesion was identified. The right side of the abdomen was prepped with chlorhexidine and sterile field was created. Skin and soft tissues were anesthetized with 1% lidocaine. 35 gauge coaxial needle was directed into the liver lesion with real-time ultrasound guidance. Three core biopsies were obtained with an 18 gauge core device. Gel-Foam slurry was injected along the needle track following the biopsies. Bandage placed over the puncture site. FINDINGS: Trace perihepatic ascites near the hepatic dome. Not enough fluid for a paracentesis. Large hyperechoic liver mass in the right hepatic lobe. Needle position confirmed within the liver lesion. Adequate core biopsies obtained. No significant bleeding or hematoma following the core biopsies. IMPRESSION:  Ultrasound-guided core biopsies of the hepatic mass. Electronically Signed   By: Markus Daft M.D.   On: 09/30/2019 13:16     Kalman Drape, [facility] Surgery Pager [phone] Cristine Polio, & Friday 7:00am - 4:30pm Thursdays 7:00am -11:30am

## 2019-10-02 NOTE — Progress Notes (Signed)
[facility]  PROGRESS NOTE    Charles Espinoza  [id number] DOB: 03-03-1941 DOA: 09/19/2019 PCP: Rogers Blocker, MD   Brief Narrative:   Male with prior history of prostate cancer s/p radiation in January 2015, COPD presents to ED with generalized abdominal pain for 1 month associated with abdominal distention and watery bowel movements.  CT of the abdomen and pelvis shows volvulus and complex sigmoid collection.  Patient was started on broad-spectrum IV antibiotics and general surgery consulted.  Patient underwent exploratory laparotomy with diverting colostomy on 09/23/2019. Palliative care consulted for goals of care discussion. Family would like to pursue full scope of treatment. Meanwhile pt s nausea, abd pain has improved and NG tube was removed by surgery and started on clears and advanced to full liquid diet. As his bowels have decompressed, IR consulted for liver biopsy . He underwent liver biopsy   10/14: No acute events ON. Eager to go home.    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    - Patient's abdominal distention and abdominal  has improved.       - NG tube was discontinued by surgery and he was started on clears and advanced to full liquid diet.        - Patient has colostomy bag with some gas and small stool.  Liver mass     - MRI of the abdomen shows right hepatic lesion favoring metastatic lesion from the colon cancer      - IR consulted for CT-guided liver biopsy as his abdominal distention is improving.  He will also need oncology follow-up on discharge; now s/p Bx, awaiting results  Anemia of chronic disease:  - No signs of bleeding.      - Anemia panel shows low iron .      - Iron supplementation added; monitor   History of prostate cancer      - Status post XRT in 2015  Acute Kidney injury probably secondary to prerenal/dehydration     - Resolved, monitor  Hypernatremia     - Resolved.    Hypophosphatemia:      - Repleted.   Nutrition      Taper TPN, increase PO intake  Acute urinary retention:      - Probably secondary to ileus . Voiding trial was unsuccessful Foley has to be replaced.     - voiding trial tomorrow  Hypokalemia     - Replaced; follow labs  DVT prophylaxis: lovenox Code Status: FULL Family Communication: With wife on phone   Disposition Plan: TBD  Consultants:   Gen surg  GI  ROS:  Denies CP, ab pain, dyspnea . Remainder 10-pt ROS is negative for all not previously mentioned.  Subjective: "Do I have to go home with this?"  Objective: Vitals:   10/01/19 1601 10/01/19 2033 10/02/19 0540 10/02/19 1312  BP: 108/73 99/65 106/66 102/71  Pulse: 89 92 78 79  Resp: 20 16 20 18   Temp: 98.1 F (36.7 C) 99 F (37.2 C) 98.6 F (37 C) 97.8 F (36.6 C)  TempSrc: Oral Oral Oral Oral  SpO2: 100% 100% 100%  100%  Weight:      Height:        Intake/Output Summary (Last 24 hours) at 10/02/2019 1656 Last data filed at 10/02/2019 1314 Gross per 24 hour  Intake 2160.8 ml  Output 1670 ml  Net 490.8 ml   Filed Weights   09/19/19 2200 09/23/19 0850  Weight: 70.3 kg 70.3 kg    Examination:  General: 78 y.o. male resting in bed in NAD Cardiovascular: RRR, +S1, S2, no m/g/r, equal pulses throughout Respiratory: CTABL, no w/r/r, normal WOB GI: BS+, NDNT, no masses noted, no organomegaly noted, ostomy noted MSK: No e/c/c Skin: No rashes, bruises, ulcerations noted Neuro: Alert to name, follows commands Psyc: Appropriate interaction and affect, calm/cooperative   Data Reviewed: I have personally reviewed following labs and imaging studies.  CBC:  Recent Labs  Lab Oct 07, 2019 0417 09/28/19 0348 09/30/19 0329 10/01/19 0337  WBC 13.1* 12.2* 7.4 6.5  NEUTROABS 11.4*  --  6.1  --   HGB 12.8* 11.6* 10.4* 10.5*  HCT 39.5 36.5* 33.7* 32.3*  MCV 96.3 97.6 98.8 95.8  PLT 416* 306 279 [id number]   Basic Metabolic Panel: Recent Labs  Lab 2019/10/07 0417 09/27/19 0429 09/28/19 0348 09/29/19 0405 09/30/19 0329  NA 153* 152* 150* 146* 139  K 3.5 3.1* 3.4* 3.7 4.0  CL 114* 115* 114* 114* 112*  CO2 28 29 27 25 23   GLUCOSE 157* 141* 127* 127* 127*  BUN 23 17 18 19 16   CREATININE 0.93 0.79 0.72 0.72 0.70  CALCIUM 8.4* 7.9* 8.0* 7.7* 7.4*  MG 1.9 1.9 2.0 1.7 1.8  PHOS 2.3* 3.0 2.5 2.7 2.7   GFR: Estimated Creatinine Clearance: 71.3 mL/min (by C-G formula based on SCr of 0.7 mg/dL). Liver Function Tests: Recent Labs  Lab 10-07-2019 0417 09/30/19 0329  AST 29 41  ALT 19 21  ALKPHOS 57 63  BILITOT 0.5 0.4  PROT 5.3* 4.4*  ALBUMIN 1.8* 1.4*   No results for input(s): LIPASE, AMYLASE in the last 168 hours. No results for input(s): AMMONIA in the last 168 hours. Coagulation Profile: Recent Labs  Lab 09/30/19 0329  INR 1.2   Cardiac Enzymes: No results for input(s): CKTOTAL, CKMB, CKMBINDEX, TROPONINI in the last 168 hours. BNP (last 3 results) No results for input(s): PROBNP in the last 8760 hours. HbA1C: No results for input(s): HGBA1C in the last 72 hours. CBG: Recent Labs  Lab 10/01/19 2030 10/01/19 2353 10/02/19 0540 10/02/19 0806 10/02/19 1153  GLUCAP 101* 104* 105* 123* 106*   Lipid Profile: Recent Labs    09/30/19 0329  TRIG 64   Thyroid Function Tests: No results for input(s): TSH, T4TOTAL, FREET4, T3FREE, THYROIDAB in the last 72 hours. Anemia Panel: Recent Labs    10/01/19 0337  VITAMINB12 371  FOLATE 10.2  FERRITIN 140  TIBC 126*  IRON 41*   Sepsis Labs: No results for input(s): PROCALCITON, LATICACIDVEN in the last 168 hours.  No results found for this or any previous visit (from the past 240  hour(s)).    Radiology Studies: No results found.   Scheduled Meds: . Chlorhexidine Gluconate Cloth  6 each Topical Daily  . docusate sodium  100 mg Oral Daily  . enoxaparin (LOVENOX) injection  40 mg Subcutaneous Q24H  . feeding supplement (ENSURE ENLIVE)  237 mL Oral TID BM  . polyethylene glycol  17 g Oral Daily   Continuous Infusions: . dextrose 5 % and 0.45% NaCl 75 mL/hr at 10/01/19 2148  . TPN ADULT (ION)  45 mL/hr at 10/01/19 1901  . TPN ADULT (ION)       LOS: 13 days    Time spent: 25 minutes spent in the coordination of care today.    Jonnie Finner, DO [facility] Pager [phone]  If 7PM-7AM, please contact night-coverage [email or web address] Password [email or web address] 10/02/2019, 4:56 PM

## 2019-10-02 NOTE — NC FL2 (Signed)
[place] LEVEL OF CARE SCREENING TOOL     IDENTIFICATION  Patient Name: Charles Espinoza Birthdate: 1941-05-19 Sex: male Admission Date (Current Location): 09/19/2019  [facility] and [place] Number:  Herbalist and Address:  [facility] [place]. [facility], [place] [address], [place], [place] [address]      Provider Number: [id number]  Attending Physician Name and Address:  Jonnie Finner, DO  Relative Name and Phone Number:       Current Level of Care: Hospital Recommended Level of Care: [place] Prior Approval Number:    Date Approved/Denied:   PASRR Number: [id number]  Discharge Plan: SNF    Current Diagnoses: Patient Active Problem List   Diagnosis Date Noted  . Colon obstruction ([place])   . Palliative care encounter   . Protein-calorie malnutrition, severe 09/26/2019  . Abdominal distension   . Liver mass 09/19/2019  . Colonic mass 09/19/2019  . Mass of colon 09/19/2019  . Malignant neoplasm of prostate ([place]) 08/15/2013    Orientation RESPIRATION BLADDER Height & Weight     Self, Time, Situation, Place  Normal Continent, Indwelling catheter Weight: 155 lb (70.3 kg) Height:  5\' 8"  (172.7 cm)  BEHAVIORAL SYMPTOMS/MOOD NEUROLOGICAL BOWEL NUTRITION STATUS      Continent, Colostomy Diet(see discharge summary)  AMBULATORY STATUS COMMUNICATION OF NEEDS Skin   Limited Assist Verbally Surgical wounds(LUQ 2 piece colostomy; incision on abdomen w/ wet to dry, gauze, and abdominal pads; incision on abdomen w/ adhesive bandage to be changed PRN)                       Personal Care Assistance Level of Assistance  Bathing, Feeding, Dressing Bathing Assistance: Limited assistance Feeding assistance: Independent Dressing Assistance: Limited assistance     Functional Limitations Info  Sight, Hearing, Speech Sight Info: Adequate Hearing Info: Adequate Speech Info: Adequate    SPECIAL CARE FACTORS FREQUENCY  OT (By licensed  OT), PT (By licensed PT)     PT Frequency: 5x week OT Frequency: 5x week            Contractures Contractures Info: Not present    Additional Factors Info  Code Status, Allergies Code Status Info: Full Code Allergies Info: Penicillins           Current Medications (10/02/2019):  This is the current hospital active medication list Current Facility-Administered Medications  Medication Dose Route Frequency Provider Last Rate Last Dose  . acetaminophen (TYLENOL) tablet 650 mg  650 mg Oral Q6H PRN Coralie Keens, MD   650 mg at 09/20/19 2058   Or  . acetaminophen (TYLENOL) suppository 650 mg  650 mg Rectal Q6H PRN Coralie Keens, MD      . Chlorhexidine Gluconate Cloth 2 % PADS 6 each  6 each Topical Daily Coralie Keens, MD   6 each at 10/02/19 1000  . dextrose 5 %-0.45 % sodium chloride infusion   Intravenous Continuous Hosie Poisson, MD 75 mL/hr at 10/01/19 2148    . docusate sodium (COLACE) capsule 100 mg  100 mg Oral Daily Focht, Jessica L, PA   100 mg at 10/02/19 1000  . enoxaparin (LOVENOX) injection 40 mg  40 mg Subcutaneous Q24H Docia Barrier, PA   40 mg at 10/01/19 2147  . feeding supplement (ENSURE ENLIVE) (ENSURE ENLIVE) liquid 237 mL  237 mL Oral TID BM Kyle, Tyrone A, DO      . morphine 2 MG/ML injection 1 mg  1  mg Intravenous Q4H PRN Coralie Keens, MD   1 mg at 10/02/19 0542  . ondansetron (ZOFRAN) tablet 4 mg  4 mg Oral Q6H PRN Coralie Keens, MD       Or  . ondansetron [facility]) injection 4 mg  4 mg Intravenous Q6H PRN Coralie Keens, MD      . phenol (CHLORASEPTIC) mouth spray 1 spray  1 spray Mouth/Throat PRN Hosie Poisson, MD   1 spray at 09/28/19 1637  . polyethylene glycol (MIRALAX / GLYCOLAX) packet 17 g  17 g Oral Daily Focht, Jessica L, PA   17 g at 10/02/19 1000  . sodium chloride flush (NS) 0.9 % injection 10-40 mL  10-40 mL Intracatheter PRN Hosie Poisson, MD      . TPN ADULT (ION)   Intravenous Continuous TPN Priscella Mann, RPH 45 mL/hr at 10/01/19 1901    . TPN ADULT (ION)   Intravenous Continuous TPN Tyrone Apple, [facility]         Discharge Medications: Please see discharge summary for a list of discharge medications.  Relevant Imaging Results:  Relevant Lab Results:   Additional Information SS# [place], [place]

## 2019-10-02 NOTE — Discharge Instructions (Signed)
[facility]      [facility] [phone]  OPEN ABDOMINAL SURGERY: POST OP INSTRUCTIONS  Always review your discharge instruction sheet given to you by the facility where your surgery was performed.  IF YOU HAVE DISABILITY OR FAMILY LEAVE FORMS, YOU MUST BRING THEM TO THE OFFICE FOR PROCESSING.  PLEASE DO NOT GIVE THEM TO YOUR DOCTOR.  1. A prescription for pain medication may be given to you upon discharge.  Take your pain medication as prescribed, if needed.  If narcotic pain medicine is not needed, then you may take acetaminophen (Tylenol) or ibuprofen (Advil) as needed. 2. Take your usually prescribed medications unless otherwise directed. 3. If you need a refill on your pain medication, please contact your pharmacy. They will contact our office to request authorization.  Prescriptions will not be filled after 5pm or on week-ends. 4. You should follow a light diet the first few days after arrival home, such as soup and crackers, pudding, etc.unless your doctor has advised otherwise. A high-fiber, low fat diet can be resumed as tolerated.   Be sure to include lots of fluids daily. Most patients will experience some swelling and bruising on the chest and neck area.  Ice packs will help.  Swelling and bruising can take several days to resolve 5. Most patients will experience some swelling and bruising in the area of the incision. Ice pack will help. Swelling and bruising can take several days to resolve..  6. It is common to experience some constipation if taking pain medication after surgery.  Increasing fluid intake and taking a stool softener will usually help or prevent this problem from occurring.  A mild laxative (Milk of Magnesia or Miralax) should be taken according to package directions if there are no bowel movements after 48 hours. 7.  You may have steri-strips (small skin tapes) in place directly over the incision.  These strips should be left on the skin for 7-10 days.  If your  surgeon used skin glue on the incision, you may shower in 24 hours.  The glue will flake off over the next 2-3 weeks.  Any sutures or staples will be removed at the office during your follow-up visit. You may find that a light gauze bandage over your incision may keep your staples from being rubbed or pulled. You may shower and replace the bandage daily. 8. ACTIVITIES:  You may resume regular (light) daily activities beginning the next day--such as daily self-care, walking, climbing stairs--gradually increasing activities as tolerated.  You may have sexual intercourse when it is comfortable.  Refrain from any heavy lifting or straining until approved by your doctor. a. You may drive when you no longer are taking prescription pain medication, you can comfortably wear a seatbelt, and you can safely maneuver your car and apply brakes b. Return to Work: ___________________________________ 9. You should see your doctor in the office for a follow-up appointment approximately two weeks after your surgery.  Make sure that you call for this appointment within a day or two after you arrive home to insure a convenient appointment time. OTHER INSTRUCTIONS:  _____________________________________________________________ _____________________________________________________________  WHEN TO CALL YOUR DOCTOR: 1. Fever over 101.0 2. Inability to urinate 3. Nausea and/or vomiting 4. Extreme swelling or bruising 5. Continued bleeding from incision. 6. Increased pain, redness, or drainage from the incision. 7. Difficulty swallowing or breathing 8. Muscle cramping or spasms. 9. Numbness or tingling in hands or feet or around lips.  The clinic staff is available to   answer your questions during regular business hours.  Please don't hesitate to call and ask to speak to one of the nurses if you have concerns.  For further questions, please visit [email or web address]   

## 2019-10-02 NOTE — Progress Notes (Signed)
Nutrition Follow-up  DOCUMENTATION CODES:   Severe malnutrition in context of chronic illness  INTERVENTION:   -Increase Ensure Enlive po to TID, each supplement provides 350 kcal and 20 grams of protein -TPN management per pharmacy -RD will follow for diet advancement and adjust supplement regimen as appropriate  NUTRITION DIAGNOSIS:   Severe Malnutrition related to chronic illness(rectal cancer) as evidenced by severe muscle depletion, severe fat depletion, moderate fat depletion, moderate muscle depletion.  Ongoing  GOAL:   Patient will meet greater than or equal to 90% of their needs  Progressing   MONITOR:   Diet advancement, Labs, Weight trends, Skin, I & O's  REASON FOR ASSESSMENT:   Consult Assessment of nutrition requirement/status  ASSESSMENT:   Charles Espinoza is a 78 y.o. male with medical history significant of prostate CA s/p radiation finished back in Jan 2015.Patient presents to the ED with c/o generalized abd pain for the past 1 month.  Worsening especially over the past 1 eek with abd distention.  Pain around umbilicus today.  Constant symptoms.  Loose watery stool with one episode earlier this AM.  Is passing gas still.  10/2- MRI suggestive for colon mass (malignant ulceration) liver lesion worrisome for malignancy 10/3- NGT placed 10/4- per general surgery notes, likely perforated rectal cancer that has been leaking air and liver mass 10/5- CAT scan revealed likely rectal cancer with mets to liver and persistent obstruction; s/pProcedure(s): EXPLORATORY LAPAROTOMY;DIVERTING LOOP DESCENDING COLOSTOMY 10/6- NGT clamping trials started, per RN tolerated well 10/7-NGT resumed to low, intermittent suction, PICC placed, TPN initiated 10/11- NGT removed, advanced to clear liquids 10/12- s/p US guided liver lesion biopsy 10/13- advanced to full liquid diet  Reviewed I/O's: +1.6 L x 24 hours and +4.3 L since admission  Drain output: 160 ml x 24  hours  Colostomy output: 55 ml x 24 hours  Spoke with pt, who was sitting in recliner chair at time of visit. He expressed frustration over lengthy hospital stay and overall decline in health. He reports walking well with PT earlier today, but frustrated about having to sit up in the chair. RD actively listened to pt concerns and provided emotional support.   Pt reports that he often feels full quickly, however, feels appetite is improving. Pt reports he consumed only a few bites of dinner yesterday, but consumed 100% of an Ensure. Observed breakfast tray- pt consumed about 25% of grits, 1/3 of ice cream, and 25% of milk (approximately 295 kcals and 8 grams protein). Offered pt Ensure, but politely declined, stating he would like to wait until later. Discussed importance of good meal and supplement intake, especially with plan to transition off TPN, to support post-operative healing.  Pt remains on TPN, he is currently receiving at 45 ml/hr, which provides 1222 kcals and 59 grams protein daily, meeting 68% of estimated kcals needs and 66% of estimated protein needs.   Pt with no further questions at this time, however, expressed appreciation for RD visit ("thank you for listening to understanding me. You're a nice person").   Per RNCM notes, pt may require SNF at discharge.   Labs reviewed: CBGS: 101-123.   Diet Order:   Diet Order            Diet full liquid Room service appropriate? Yes; Fluid consistency: Thin  Diet effective now              EDUCATION NEEDS:   Education needs have been addressed  Skin:  Skin Assessment: Skin Integrity  Issues: Skin Integrity Issues:: Incisions Incisions: closed abdomen  Last BM:  10/02/19 (30 ml output via colostomy)  Height:   Ht Readings from Last 1 Encounters:  09/23/19 5\' 8"  (1.727 m)    Weight:   Wt Readings from Last 1 Encounters:  09/23/19 70.3 kg    Ideal Body Weight:  70 kg  BMI:  Body mass index is 23.57  kg/m.  Estimated Nutritional Needs:   Kcal:  1800-2000  Protein:  90-105 grams  Fluid:  > 1.8 L    Damarri Rampy A. Jimmye Norman, RD, LDN, [place] Registered Dietitian II Certified Diabetes Care and Education Specialist Pager: [phone] After hours Pager: [phone]

## 2019-10-02 NOTE — TOC Progression Note (Signed)
Transition of Care [facility]) - Progression Note    Patient Details  Name: Masyn Zani MRN: [id number] Date of Birth: [birth year]  Transition of [facility]) CM/SW Contact  Jacalyn Lefevre Edson Snowball, RN Phone Number: 10/02/2019, 1:45 PM  Clinical Narrative:     Wife at bedside and does not think she can learn ostomy care or wet to dry dressing changes . Explained home health requires patient or support person to learn care prior to discharge because home health does not make daily visits.   Wife wants patient to go to SNF. Explained we could start insurance authorization for SNF, however insurance may not approve SNF for ostomy care and wet to dry dressing changes, if insurance does approve SNF , stay mau be very short. Patient and wife both voiced understanding. Also explained patient and wife need to learn ostomy and dressing care prior to discharge, again insurance may not approve SNF or only approve a short stay. Both voiced understanding. Bedside nurse aware Percell Locus) and [place] nurse present.  Patient agreeable to SNF.  Expected Discharge Plan: [place] Barriers to Discharge: Continued Medical Work up  Expected Discharge Plan and Services Expected Discharge Plan: [place]   Discharge Planning Services: CM Consult Post Acute Care Choice: [place] arrangements for the past 2 months: Single Family Home                 DME Arranged: N/A         HH Arranged: NA           Social Determinants of Health (SDOH) Interventions    Readmission Risk Interventions No flowsheet data found.

## 2019-10-02 NOTE — Consult Note (Signed)
[place] [facility] [place] Nurse ostomy follow up Stoma type/location: LMQ colostomy  **STOMA  IS SMALLER, HE NO LONGER NEEDS 4" pouch.  I have placed 2 3/4" pouches in room. Wife aware and will take these home. Wife present for teaching today.  Participates in care Stomal assessment/size: 2 1/4" stoma Peristomal assessment: intact  Midline incision Treatment options for stomal/peristomal skin: Has plastic retention rod in place.  Pouch placed over that.  No leaks noted Output Soft  Brown stool Ostomy pouching: 2pc.  2 3/4" pouch Education provided: Pouch change performed.  Wife cut barrier to fit.  Applied new pouch.  States she understands the process but worried about leaks and her own health.  Discussed emptying when 1/3 full, twice weekly pouch changes.  Cleansing skin with mild soap and water.  BEdisde RN to teach midline wound care.  Enrolled patient in [place] Start Discharge program: Yes Will follow.  Domenic Moras MSN, RN, FNP-BC CWON Wound, Ostomy, Continence Nurse Pager [phone]

## 2019-10-03 ENCOUNTER — Telehealth: Payer: Self-pay | Admitting: Nurse Practitioner

## 2019-10-03 LAB — GLUCOSE, CAPILLARY
Glucose-Capillary: 101 mg/dL — ABNORMAL HIGH (ref 70–99)
Glucose-Capillary: 104 mg/dL — ABNORMAL HIGH (ref 70–99)
Glucose-Capillary: 111 mg/dL — ABNORMAL HIGH (ref 70–99)
Glucose-Capillary: 115 mg/dL — ABNORMAL HIGH (ref 70–99)
Glucose-Capillary: 116 mg/dL — ABNORMAL HIGH (ref 70–99)
Glucose-Capillary: 120 mg/dL — ABNORMAL HIGH (ref 70–99)

## 2019-10-03 LAB — COMPREHENSIVE METABOLIC PANEL
ALT: 29 U/L (ref 0–44)
AST: 43 U/L — ABNORMAL HIGH (ref 15–41)
Albumin: 1.4 g/dL — ABNORMAL LOW (ref 3.5–5.0)
Alkaline Phosphatase: 101 U/L (ref 38–126)
Anion gap: 6 (ref 5–15)
BUN: 11 mg/dL (ref 8–23)
CO2: 23 mmol/L (ref 22–32)
Calcium: 7.7 mg/dL — ABNORMAL LOW (ref 8.9–10.3)
Chloride: 108 mmol/L (ref 98–111)
Creatinine, Ser: 0.65 mg/dL (ref 0.61–1.24)
GFR calc Af Amer: >60 mL/min (ref >60)
GFR calc non Af Amer: >60 mL/min (ref >60)
Glucose, Bld: 120 mg/dL — ABNORMAL HIGH (ref 70–99)
Potassium: 4.1 mmol/L (ref 3.5–5.1)
Sodium: 137 mmol/L (ref 135–145)
Total Bilirubin: 0.5 mg/dL (ref 0.3–1.2)
Total Protein: 4.9 g/dL — ABNORMAL LOW (ref 6.5–8.1)

## 2019-10-03 LAB — SURGICAL PATHOLOGY

## 2019-10-03 LAB — MAGNESIUM: Magnesium: 1.7 mg/dL (ref 1.7–2.4)

## 2019-10-03 LAB — PHOSPHORUS: Phosphorus: 3.3 mg/dL (ref 2.5–4.6)

## 2019-10-03 MED ORDER — BETHANECHOL CHLORIDE 10 MG PO TABS
10.0000 mg | ORAL_TABLET | Freq: Three times a day (TID) | ORAL | Status: DC
  Administered 2019-10-03 – 2019-10-04 (×4): 10 mg via ORAL
  Filled 2019-10-03 (×5): qty 1

## 2019-10-03 MED ORDER — ADULT MULTIVITAMIN W/MINERALS CH
1.0000 | ORAL_TABLET | Freq: Every day | ORAL | Status: DC
  Administered 2019-10-04 – 2019-10-05 (×2): 1 via ORAL
  Filled 2019-10-03 (×2): qty 1

## 2019-10-03 MED ORDER — OXYCODONE HCL 5 MG PO TABS
5.0000 mg | ORAL_TABLET | ORAL | Status: DC | PRN
  Administered 2019-10-03 – 2019-10-04 (×2): 5 mg via ORAL
  Filled 2019-10-03 (×2): qty 1

## 2019-10-03 NOTE — Final Consult Note (Signed)
Consultant Final Sign-Off Note    Assessment/Final recommendations  Charles Espinoza is a 78 y.o. male followed by me for Large bowel obstruction due to possible colorectal cancerversus diverticulitis -S/Pex lap, diverting loop descending colostomy - Dr. Ninfa Linden 10/4 -Twice daily wet-to-dry dressing changes to midline,DC JP drain Postop ileus -resolving,having stooland tolerating FLD - weaning TPN - advance diet to soft Urinary retention - added urecholine   Wound care (if applicable): wet to dry dressing to midline   Diet at discharge: soft diet for 1-2 weeks then regular diet   Activity at discharge: per primary team no lifting >20lbs for 6-8 weeks   Follow-up appointment:  In AVS   Pending results:  Unresulted Labs (From admission, onward)    Start     Ordered   09/30/19 0500  CBC  (TPN Lab Panel)  Every Monday (0500),   R    Question:  Specimen collection method  Answer:  Lab=Lab collect   09/25/19 0847   09/30/19 0500  Differential  (TPN Lab Panel)  Every Monday (0500),   R    Question:  Specimen collection method  Answer:  Lab=Lab collect   09/25/19 0847   09/30/19 0500  Triglycerides  (TPN Lab Panel)  Every Monday (0500),   R    Question:  Specimen collection method  Answer:  Lab=Lab collect   09/25/19 0847   09/30/19 0500  Prealbumin  (TPN Lab Panel)  Every Monday (0500),   R    Question:  Specimen collection method  Answer:  Lab=Lab collect   09/25/19 0847   09/26/19 0500  Comprehensive metabolic panel  (TPN Lab Panel)  Every Mon,Thu (0500),   R    Question:  Specimen collection method  Answer:  Lab=Lab collect   09/25/19 0847   09/26/19 0500  Magnesium  (TPN Lab Panel)  Every Mon,Thu (0500),   R    Question:  Specimen collection method  Answer:  Lab=Lab collect   09/25/19 0847   09/26/19 0500  Phosphorus  (TPN Lab Panel)  Every Mon,Thu (0500),   R    Question:  Specimen collection method  Answer:  Lab=Lab collect   09/25/19 0847           Medication  recommendations: bowel regiment   Other recommendations:    Thank you for allowing [place] to participate in the care of your patient!  Please consult [place] again if you have further needs for your patient.  Kalman Drape 10/03/2019 8:18 AM    Subjective   CC: abdominal pain overnight   Pt states episode of abdominal pain that was relieved with morphine. We discussed using oxy for his pain.  No pain this a.m.  Patient is tolerating a full liquid diet without any nausea or vomiting.  Objective  Vital signs in last 24 hours: Temp:  [97.7 F (36.5 C)-98 F (36.7 C)] 98 F (36.7 C) (10/15 0457) Pulse Rate:  [79-85] 85 (10/15 0457) Resp:  [17-18] 17 (10/15 0457) BP: (102-110)/(71) 110/71 (10/15 0457) SpO2:  [99 %-100 %] 99 % (10/15 0457)  PE: Gen: Alert, NAD,cooperative, pleasant Pulm:Rate andeffort normal Abd: Soft,mild distention,+BS,JP drain is serous, midline incision is clean and without purulent drainage, stoma is large andred,copiousgas,small amt of liquidstool in ostomy. no TTP.No peritonitis. Skin: no rashes noted, warm and dry  Pertinent labs and Studies: Recent Labs    10/01/19 0337  WBC 6.5  HGB 10.5*  HCT 32.3*   BMET Recent Labs    10/03/19 0450  NA 137  K 4.1  CL 108  CO2 23  GLUCOSE 120*  BUN 11  CREATININE 0.65  CALCIUM 7.7*   No results for input(s): LABURIN in the last 72 hours. Results for orders placed or performed during the hospital encounter of 09/19/19  SARS Coronavirus 2 [facility] order, Performed in [facility] hospital lab) Nasopharyngeal Nasopharyngeal Swab     Status: None   Collection Time: 09/19/19  9:43 PM   Specimen: Nasopharyngeal Swab  Result Value Ref Range Status   SARS Coronavirus 2 NEGATIVE NEGATIVE Final    Comment: (NOTE) If result is NEGATIVE SARS-CoV-2 target nucleic acids are NOT DETECTED. The SARS-CoV-2 RNA is generally detectable in upper and lower  respiratory specimens during the acute phase of  infection. The lowest  concentration of SARS-CoV-2 viral copies this assay can detect is 250  copies / mL. A negative result does not preclude SARS-CoV-2 infection  and should not be used as the sole basis for treatment or other  patient management decisions.  A negative result may occur with  improper specimen collection / handling, submission of specimen other  than nasopharyngeal swab, presence of viral mutation(s) within the  areas targeted by this assay, and inadequate number of viral copies  (<250 copies / mL). A negative result must be combined with clinical  observations, patient history, and epidemiological information. If result is POSITIVE SARS-CoV-2 target nucleic acids are DETECTED. The SARS-CoV-2 RNA is generally detectable in upper and lower  respiratory specimens dur ing the acute phase of infection.  Positive  results are indicative of active infection with SARS-CoV-2.  Clinical  correlation with patient history and other diagnostic information is  necessary to determine patient infection status.  Positive results do  not rule out bacterial infection or co-infection with other viruses. If result is PRESUMPTIVE POSTIVE SARS-CoV-2 nucleic acids MAY BE PRESENT.   A presumptive positive result was obtained on the submitted specimen  and confirmed on repeat testing.  While 2019 novel coronavirus  (SARS-CoV-2) nucleic acids may be present in the submitted sample  additional confirmatory testing may be necessary for epidemiological  and / or clinical management purposes  to differentiate between  SARS-CoV-2 and other Sarbecovirus currently known to infect humans.  If clinically indicated additional testing with an alternate test  methodology [phone]) is advised. The SARS-CoV-2 RNA is generally  detectable in upper and lower respiratory sp ecimens during the acute  phase of infection. The expected result is Negative. Fact Sheet for Patients:   StrictlyIdeas.no Fact Sheet for Healthcare Providers: BankingDealers.co.za This test is not yet approved or cleared by the [place] FDA and has been authorized for detection and/or diagnosis of SARS-CoV-2 by FDA under an Emergency Use Authorization (EUA).  This EUA will remain in effect (meaning this test can be used) for the duration of the COVID-19 declaration under Section 564(b)(1) of the Act, 21 U.S.C. section 360bbb-3(b)(1), unless the authorization is terminated or revoked sooner. Performed at [facility], [place] [address], [place], [place] [address]   MRSA PCR Screening     Status: None   Collection Time: 09/20/19  1:25 AM   Specimen: Nasopharyngeal  Result Value Ref Range Status   MRSA by PCR NEGATIVE NEGATIVE Final    Comment:        The GeneXpert MRSA Assay (FDA approved for NASAL specimens only), is one component of a comprehensive MRSA colonization surveillance program. It is not intended to diagnose MRSA infection nor to guide or  monitor treatment for MRSA infections. Performed at [facility], [place] [address], [place], [place] [address]     Imaging: No results found.

## 2019-10-03 NOTE — Progress Notes (Signed)
Visited with patient while on rounds. Patient leans on his faith and family relations for support. Will continue to provide spiritual care as needed.  Rev. Strum.

## 2019-10-03 NOTE — Progress Notes (Signed)
Nutrition Follow-up  DOCUMENTATION CODES:   Severe malnutrition in context of chronic illness  INTERVENTION:   -TPN per pharmacy; due d/c today -Ensure Enlive po TID, each supplement provides 350 kcal and 20 grams of protein -MVI with minerals daily  NUTRITION DIAGNOSIS:   Severe Malnutrition related to chronic illness(rectal cancer) as evidenced by severe muscle depletion, severe fat depletion, moderate fat depletion, moderate muscle depletion.  Ongoing  GOAL:   Patient will meet greater than or equal to 90% of their needs  Progressing   MONITOR:   Diet advancement, Labs, Weight trends, Skin, I & O's  REASON FOR ASSESSMENT:   Consult Assessment of nutrition requirement/status  ASSESSMENT:   Charles Espinoza is a 78 y.o. male with medical history significant of prostate CA s/p radiation finished back in Jan 2015.Patient presents to the ED with c/o generalized abd pain for the past 1 month.  Worsening especially over the past 1 eek with abd distention.  Pain around umbilicus today.  Constant symptoms.  Loose watery stool with one episode earlier this AM.  Is passing gas still.  10/2- MRI suggestive for colon mass (malignant ulceration) liver lesion worrisome for malignancy 10/3- NGT placed 10/4- per general surgery notes, likely perforated rectal cancer that has been leaking air and liver mass 10/5- CAT scan revealed likely rectal cancer with mets to liver and persistent obstruction; s/pProcedure(s): EXPLORATORY LAPAROTOMY;DIVERTING LOOP DESCENDING COLOSTOMY 10/6- NGT clamping trials started, per RN tolerated well 10/7-NGT resumed to low, intermittent suction, PICC placed, TPN initiated 10/11- NGT removed, advanced to clear liquids 10/12- s/pUS guided liver lesion biopsy 10/13- advanced to full liquid diet  Reviewed I/O's: +173 ml x 24 hours and +4.5 L since admission  UOP: 2.7 L since admission  Drain output: 120 ml x 24 hours  Colostomy output: 50 ml x 24  hours  Spoke with pt and wife at bedside. Pt reports feeling a lot better today. He consumed all of his breakfast, except for about 1/2 of his chicken sausage. He also consumed 100% of his Ensure supplement.   Pt receiving TPN at 45 ml/hr, which provides 1222 kcals and 59 grams protein daily, meeting 68% of estimated kcals needs and 66% of estimated protein needs. Per pharmacy, plan to d/c today. Pt is aware. Discussed importance of good meal and supplement intake to promote healing.   Spoke with pt wife, who confirms declined oral intake PTA and that pt with difficulty chewing harder foods due to lack of top teeth. Discussed ways to increase calories and protein in diet to help support healing.   Medications reviewed and include dextrose 5%-0.45% sodium chloride @ 75 ml/hr.   Labs reviewed: CBGS: 115-120.   Diet Order:   Diet Order            DIET SOFT Room service appropriate? Yes; Fluid consistency: Thin  Diet effective now              EDUCATION NEEDS:   Education needs have been addressed  Skin:  Skin Assessment: Skin Integrity Issues: Skin Integrity Issues:: Incisions Incisions: closed abdomen  Last BM:  10/03/19 (50 ml output via colostomy)  Height:   Ht Readings from Last 1 Encounters:  09/23/19 5\' 8"  (1.727 m)    Weight:   Wt Readings from Last 1 Encounters:  09/23/19 70.3 kg    Ideal Body Weight:  70 kg  BMI:  Body mass index is 23.57 kg/m.  Estimated Nutritional Needs:   Kcal:  1800-2000  Protein:  90-105 grams  Fluid:  > 1.8 L    Charles Espinoza A. Jimmye Norman, RD, LDN, [place] Registered Dietitian II Certified Diabetes Care and Education Specialist Pager: [phone] After hours Pager: [phone]

## 2019-10-03 NOTE — TOC Progression Note (Signed)
Transition of Care [facility]) - Progression Note    Patient Details  Name: Charles Espinoza MRN: [id number] Date of Birth: 1941-03-25  Transition of Care [facility]) CM/SW [place], [place] Phone Number: 10/03/2019, 1:02 PM  Clinical Narrative:    Pt and pt wife provided with bed offers. Discussed with wife making decision today for insurance purposes- she states understanding. Pt wife would like [facility] and if they cannot offer then she expressed interest in [place]. CSW will reach out to [facility] and they will review. Requested COVID screen this morning.   Expected Discharge Plan: Skilled Nursing Facility Barriers to Discharge: Continued Medical Work up, Insurance Authorization(weaning TPN; COVID pending.)  Expected Discharge Plan and Services Expected Discharge Plan: [place] Discharge Planning Services: CM Consult Post Acute Care Choice: [place] arrangements for the past 2 months: Single Family Home          DME Arranged: N/A HH Arranged: NA   Social Determinants of Health (SDOH) Interventions    Readmission Risk Interventions Readmission Risk Prevention Plan 10/03/2019  Transportation Screening Complete  PCP or Specialist Appt within 5-7 Days Not Complete  Not Complete comments plan for SNF  Home Care Screening Complete  Medication Review (RN CM) Complete  Some recent data might be hidden

## 2019-10-03 NOTE — TOC Progression Note (Signed)
Transition of Care [facility]) - Progression Note    Patient Details  Name: Charles Espinoza MRN: [id number] Date of Birth: July 16, 1941  Transition of Care [facility]) CM/SW [place], [place] Phone Number: 10/03/2019, 5:16 PM  Clinical Narrative:    Auth completed by CSW, await full auth #.  [place] able to offer a placement, called and spoke with pt wife Danton Clap and she is amenable to bed when COVID test has resulted. Hopeful for dc tomorrow pending medical stability and COVID result. [place] aware.    Expected Discharge Plan: Skilled Nursing Facility Barriers to Discharge: Continued Medical Work up(weaning TPN; COVID pending.)  Expected Discharge Plan and Services Expected Discharge Plan: [place] Discharge Planning Services: CM Consult Post Acute Care Choice: Home Health Living arrangements for the past 2 months: Single Family Home            DME Arranged: N/A HH Arranged: NA   Social Determinants of Health (SDOH) Interventions    Readmission Risk Interventions Readmission Risk Prevention Plan 10/03/2019  Transportation Screening Complete  PCP or Specialist Appt within 5-7 Days Not Complete  Not Complete comments plan for SNF  Home Care Screening Complete  Medication Review (RN CM) Complete  Some recent data might be hidden

## 2019-10-03 NOTE — Progress Notes (Signed)
[facility]  PROGRESS NOTE    Charles Espinoza  [id number] DOB: 08-Jan-1941 DOA: 09/19/2019 PCP: Charles Blocker, MD   Brief Narrative:   Male with prior history of prostate cancer s/p radiation in January 2015, COPD presents to ED with generalized abdominal pain for 1 month associated with abdominal distention and watery bowel movements. CT of the abdomen and pelvis shows volvulus and complex sigmoid collection. Patient was started on broad-spectrum IV antibiotics and general surgery consulted. Patient underwent exploratory laparotomy with diverting colostomy on 09/23/2019. Palliative care consulted for goals of care discussion. Family would like to pursue full scope of treatment. Meanwhile pt s nausea, abd pain has improved and NG tube was removed by surgery and started on clearsand advanced to full liquid diet.As his bowels have decompressed, IR consulted for liver biopsy . He underwent liver biopsy   10/14: No acute events ON. Eager to go home.  10/15: Says he felt rough last night.   Assessment & Plan:   Principal Problem:   Colonic mass Active Problems:   Malignant neoplasm of prostate (HCC)   Liver mass   Mass of colon   Abdominal distension   Protein-calorie malnutrition, severe   Colon obstruction Charles Espinoza)   Palliative care encounter   Large bowel obstruction secondary to perforated rectal cancer Liver mass      - status post exploratory laparotomy with diverting colostomy on 09/22/2019.    - Patient's abdominal distention and abdominal  has improved.       - NG tube was discontinued by surgery and he was started on clears and advanced to full liquid diet.        - Patient has colostomy bag with some gas and small stool.    - MRI of the abdomen shows right hepatic lesion favoring metastatic lesion from the colon cancer      - IR consulted for CT-guided liver biopsy as his abdominal distention is improving.  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      - Anemia panel shows low iron .      - Iron supplementation added; monitor   History of prostate cancer      - Status post XRT in 2015  Acute Kidney injury probably secondary to prerenal/dehydration     - Resolved, monitor  Hypernatremia     - Resolved.    Hypophosphatemia:      - Repleted.   Severe protein-cal malnutrition      Taper TPN, increase PO intake  Acute urinary retention:      - Probably secondary to ileus . Voiding trial was unsuccessful Foley has to be replaced.     - voiding trial today; if unsuccessful, will replace foley and will have outpt urology follow up  Hypokalemia     - Replaced; follow labs  Pt/Family electing to go to SNF. Awaiting insurance approval. COVID screen ordered.   DVT prophylaxis: lovenox Code Status: FULL   Disposition Plan: TBD  Consultants:   General Surgery  GI  ROS:  Reports constipation. Denies CP, dyspnea, N, V. Remainder 10-pt ROS is negative for all not previously mentioned.  Subjective: "I felt like  I couldn't have a movement last night."  Objective: Vitals:   10/02/19 0540 10/02/19 1312 10/02/19 2214 10/03/19 0457  BP: 106/66 102/71  110/71  Pulse: 78 79 83 85  Resp: 20 18 18 17   Temp: 98.6 F (37 C) 97.8 F (36.6 C) 97.7 F (36.5 C) 98 F (36.7 C)  TempSrc: Oral Oral Oral Axillary  SpO2: 100% 100% 100% 99%  Weight:      Height:        Intake/Output Summary (Last 24 hours) at 10/03/2019 1307 Last data filed at 10/03/2019 1101 Gross per 24 hour  Intake 2692.59 ml  Output 3600 ml  Net -907.41 ml   Filed Weights   09/19/19 2200 09/23/19 0850  Weight: 70.3 kg 70.3 kg    Examination:  General: 77 y.o. male resting in bed in NAD  Cardiovascular: RRR, +S1, S2, no m/g/r, equal pulses throughout Respiratory: CTABL, no w/r/r, normal WOB GI: BS+, NDNT, no masses noted, no organomegaly noted, ostomy noted MSK: No e/c/c Neuro: Alert to name, follows commands Psyc: Appropriate interaction and affect, calm/cooperative   Data Reviewed: I have personally reviewed following labs and imaging studies.  CBC: Recent Labs  Lab 09/28/19 0348 09/30/19 0329 10/01/19 0337  WBC 12.2* 7.4 6.5  NEUTROABS  --  6.1  --   HGB 11.6* 10.4* 10.5*  HCT 36.5* 33.7* 32.3*  MCV 97.6 98.8 95.8  PLT 306 279 [id number]   Basic Metabolic Panel: Recent Labs  Lab 09/27/19 0429 09/28/19 0348 09/29/19 0405 09/30/19 0329 10/03/19 0450  NA 152* 150* 146* 139 137  K 3.1* 3.4* 3.7 4.0 4.1  CL 115* 114* 114* 112* 108  CO2 29 27 25 23 23   GLUCOSE 141* 127* 127* 127* 120*  BUN 17 18 19 16 11   CREATININE 0.79 0.72 0.72 0.70 0.65  CALCIUM 7.9* 8.0* 7.7* 7.4* 7.7*  MG 1.9 2.0 1.7 1.8 1.7  PHOS 3.0 2.5 2.7 2.7 3.3   GFR: Estimated Creatinine Clearance: 71.3 mL/min (by C-G formula based on SCr of 0.65 mg/dL). Liver Function Tests: Recent Labs  Lab 09/30/19 0329 10/03/19 0450  AST 41 43*  ALT 21 29  ALKPHOS 63 101  BILITOT 0.4 0.5  PROT 4.4* 4.9*  ALBUMIN 1.4* 1.4*   No results for input(s): LIPASE, AMYLASE in the last 168 hours. No results for input(s): AMMONIA in the last 168 hours. Coagulation Profile: Recent Labs  Lab 09/30/19 0329  INR 1.2   Cardiac Enzymes: No results for input(s): CKTOTAL, CKMB, CKMBINDEX, TROPONINI in the last 168 hours. BNP (last 3 results) No results for input(s): PROBNP in the last 8760 hours. HbA1C: No results for input(s): HGBA1C in the last 72 hours. CBG: Recent Labs  Lab 10/02/19 2023 10/03/19 0011 10/03/19 0456 10/03/19 0738 10/03/19 1156  GLUCAP 115* 120* 115* 116* 111*   Lipid Profile: No results for input(s): CHOL, HDL, LDLCALC, TRIG, CHOLHDL, LDLDIRECT in the last 72 hours. Thyroid  Function Tests: No results for input(s): TSH, T4TOTAL, FREET4, T3FREE, THYROIDAB in the last 72 hours. Anemia Panel: Recent Labs    10/01/19 0337  VITAMINB12 371  FOLATE 10.2  FERRITIN 140  TIBC 126*  IRON 41*   Sepsis Labs: No results for input(s): PROCALCITON, LATICACIDVEN in the last 168 hours.  No results found for this or any previous visit (from the past 240 hour(s)).    Radiology Studies: No results found.   Scheduled Meds: . bethanechol  10 mg Oral TID  . Chlorhexidine Gluconate Cloth  6  each Topical Daily  . docusate sodium  100 mg Oral Daily  . enoxaparin (LOVENOX) injection  40 mg Subcutaneous Q24H  . feeding supplement (ENSURE ENLIVE)  237 mL Oral TID BM  . polyethylene glycol  17 g Oral Daily   Continuous Infusions: . dextrose 5 % and 0.45% NaCl 75 mL/hr at 10/03/19 1053  . TPN ADULT (ION) 20 mL/hr at 10/03/19 0938     LOS: 14 days    Time spent: 25 minutes spent in the coordination of care today.    Jonnie Finner, DO [facility] Pager [phone]  If 7PM-7AM, please contact night-coverage [email or web address] Password [email or web address] 10/03/2019, 1:07 PM

## 2019-10-03 NOTE — Progress Notes (Signed)
PHARMACY - ADULT TOTAL PARENTERAL NUTRITION CONSULT NOTE  Pharmacy Consult:  TPN Indication:  Prolonged ileus  Patient Measurements: Height: 5\' 8"  (172.7 cm) Weight: 155 lb (70.3 kg) IBW/kg (Calculated) : 68.4 TPN AdjBW (KG): 70.3 Body mass index is 23.57 kg/m.  Assessment:  29 YOM presented with abdominal pain, found to have a SBO due to possible colorectal cancer vs diverticulitis. S/p ex-lap with diverting loop descending colostomy on 10/5. Patient with post-op ileus since 10/1, so TPN started while awaiting return of bowel function.  GI: prealbumin up to 15.4, ostomy O/P 53mL, JP drain 156mL (serous on 10/14), LBM 10/13  CT guided liver biopsy 10/12 Docusate, Miralax Endo: No hx DM - CBGs well controlled, SSI d/c'ed 10/01/19 Insulin requirements in the past 24 hours: 0 units Lytes: all WNL (Na and Mag low normal) Renal: [place] 0.65, BUN WNL - UOP 1.6 mg/kg/hr, D51/2NS at 75 ml/hr, net +4.2L Bethanechol  Pulm: stable on RA Cards: VSS Hepatobil: LFTs WNL except AST 43, tbili / TG WNL Neuro: PRN morphine ID: s/p Azactam/Flagyl for IAI - afebrile, WBC WNL TPN Access: PICC placed 09/25/19 TPN start date: 09/25/19  Nutritional Goals (per RD rec on 10/14): 1800-2000 kCal, 90-105gm protein, >1.8L fluid per day  Current Nutrition:  TPN Soft diet started 10/14 - patient reports consuming 90% of his meals yesterday Ensure Enlive TID - 1 charted given  Plan:  Per discussion with Surgery, D/C TPN Rreduce TPN to 20 ml/hr, then stop at 1800 D/C TPN labs and nursing care orders  On D51/2NS at 75 ml/hr per MD - consider stopping  Kalista Laguardia D. Mina Marble, PharmD, BCPS, [place] 10/03/2019, 8:58 AM

## 2019-10-03 NOTE — Telephone Encounter (Signed)
A new patient appt has been scheduled for Charles Espinoza to see Regan Rakers Burton,NP/Dr. Burr Medico on 10/21 at 3pm. Pt is currently in the hospital. Letter mailed.

## 2019-10-04 DIAGNOSIS — L8990 Pressure ulcer of unspecified site, unspecified stage: Secondary | ICD-10-CM | POA: Insufficient documentation

## 2019-10-04 LAB — CBC WITH DIFFERENTIAL/PLATELET
Abs Immature Granulocytes: 0.06 10*3/uL (ref 0.00–0.07)
Basophils Absolute: 0 10*3/uL (ref 0.0–0.1)
Basophils Relative: 0 %
Eosinophils Absolute: 0 10*3/uL (ref 0.0–0.5)
Eosinophils Relative: 1 %
HCT: 33.4 % — ABNORMAL LOW (ref 39.0–52.0)
Hemoglobin: 10.7 g/dL — ABNORMAL LOW (ref 13.0–17.0)
Immature Granulocytes: 1 %
Lymphocytes Relative: 13 %
Lymphs Abs: 0.8 10*3/uL (ref 0.7–4.0)
MCH: 30.7 pg (ref 26.0–34.0)
MCHC: 32 g/dL (ref 30.0–36.0)
MCV: 95.7 fL (ref 80.0–100.0)
Monocytes Absolute: 0.5 10*3/uL (ref 0.1–1.0)
Monocytes Relative: 7 %
Neutro Abs: 4.9 10*3/uL (ref 1.7–7.7)
Neutrophils Relative %: 78 %
Platelets: 349 10*3/uL (ref 150–400)
RBC: 3.49 MIL/uL — ABNORMAL LOW (ref 4.22–5.81)
RDW: 14.4 % (ref 11.5–15.5)
WBC: 6.3 10*3/uL (ref 4.0–10.5)
nRBC: 0 % (ref 0.0–0.2)

## 2019-10-04 LAB — RENAL FUNCTION PANEL
Albumin: 1.7 g/dL — ABNORMAL LOW (ref 3.5–5.0)
Anion gap: 6 (ref 5–15)
BUN: 11 mg/dL (ref 8–23)
CO2: 22 mmol/L (ref 22–32)
Calcium: 7.7 mg/dL — ABNORMAL LOW (ref 8.9–10.3)
Chloride: 105 mmol/L (ref 98–111)
Creatinine, Ser: 0.73 mg/dL (ref 0.61–1.24)
GFR calc Af Amer: >60 mL/min (ref >60)
GFR calc non Af Amer: >60 mL/min (ref >60)
Glucose, Bld: 101 mg/dL — ABNORMAL HIGH (ref 70–99)
Phosphorus: 3.1 mg/dL (ref 2.5–4.6)
Potassium: 4 mmol/L (ref 3.5–5.1)
Sodium: 133 mmol/L — ABNORMAL LOW (ref 135–145)

## 2019-10-04 LAB — NOVEL CORONAVIRUS, NAA (HOSP ORDER, SEND-OUT TO REF LAB; TAT 18-24 HRS): SARS-CoV-2, NAA: NOT DETECTED

## 2019-10-04 LAB — MAGNESIUM: Magnesium: 1.6 mg/dL — ABNORMAL LOW (ref 1.7–2.4)

## 2019-10-04 LAB — GLUCOSE, CAPILLARY
Glucose-Capillary: 101 mg/dL — ABNORMAL HIGH (ref 70–99)
Glucose-Capillary: 103 mg/dL — ABNORMAL HIGH (ref 70–99)
Glucose-Capillary: 109 mg/dL — ABNORMAL HIGH (ref 70–99)
Glucose-Capillary: 110 mg/dL — ABNORMAL HIGH (ref 70–99)
Glucose-Capillary: 112 mg/dL — ABNORMAL HIGH (ref 70–99)
Glucose-Capillary: 113 mg/dL — ABNORMAL HIGH (ref 70–99)

## 2019-10-04 MED ORDER — TAMSULOSIN HCL 0.4 MG PO CAPS
0.4000 mg | ORAL_CAPSULE | Freq: Every day | ORAL | Status: DC
  Administered 2019-10-04 – 2019-10-05 (×2): 0.4 mg via ORAL
  Filled 2019-10-04 (×2): qty 1

## 2019-10-04 MED ORDER — MAGNESIUM OXIDE 400 (241.3 MG) MG PO TABS
400.0000 mg | ORAL_TABLET | Freq: Two times a day (BID) | ORAL | Status: DC
  Administered 2019-10-04 – 2019-10-05 (×3): 400 mg via ORAL
  Filled 2019-10-04 (×3): qty 1

## 2019-10-04 NOTE — Progress Notes (Signed)
Charles Espinoza  PROGRESS NOTE    Charles Espinoza  [id number] DOB: May 01, 1941 DOA: 09/19/2019 PCP: Rogers Blocker, MD   Brief Narrative:   Male with prior history of prostate cancer s/p radiation in January 2015, COPD presents to ED with generalized abdominal pain for 1 month associated with abdominal distention and watery bowel movements. CT of the abdomen and pelvis shows volvulus and complex sigmoid collection. Patient was started on broad-spectrum IV antibiotics and general surgery consulted. Patient underwent exploratory laparotomy with diverting colostomy on 09/23/2019. Palliative care consulted for goals of care discussion. Family would like to pursue full scope of treatment. Meanwhile pt s nausea, abd pain has improved and NG tube was removed by surgery and started on clearsand advanced to full liquid diet.As his bowels have decompressed, IR consulted for liver biopsy . He underwent liver biopsy  10/14: No acute events ON. Eager to go home. 10/15: Says he felt rough last night. 10/16: Failed voiding trial.   Assessment & Plan:   Principal Problem:   Colonic mass Active Problems:   Malignant neoplasm of prostate (HCC)   Liver mass   Mass of colon   Abdominal distension   Protein-calorie malnutrition, severe   Colon obstruction (HCC)   Palliative care encounter   Pressure injury of skin   Large bowel obstruction secondary to perforated rectal cancer Liver mass  - status post exploratory laparotomy with diverting colostomy on 09/22/2019.  - Patient's abdominal distention and abdominal has improved.  - NG tube was discontinued by surgery and he was started on clears and advanced to full liquid diet.  - Patient has colostomy bag with some gas and small stool.  - MRI of the abdomen shows right hepatic lesion favoring metastatic lesion from the colon cancer  - IR consulted for CT-guided liver biopsy as his abdominal distention is improving. 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 No changes for the above.  Anemia of chronic disease: - No signs of bleeding.  - Anemia panel shows low iron .  - Iron supplementation added; monitor   History of prostate cancer  - Status post XRT in 2015  Acute Kidney injury probably secondary to prerenal/dehydration - Resolved, monitor  Hypernatremia - Resolved.   Hypophosphatemia:  - Repleted.   Severe protein-cal malnutrition   -Taper TPN, increase PO intake  Acute urinary retention:  - Probably secondary to ileus . Voiding trial was unsuccessful Foley has to be replaced. -voiding trial today; if unsuccessful, will replace foley and will have outpt urology follow up     - 10/16: failed voiding trial. Change bethanecol to flomax     - spoke with urology; advised pt to call office on Monday for follow up on urinary retention  Hypokalemia Hypomagnesemia - replete, follow up labs  DVT prophylaxis: lovenox Code Status: FULL   Disposition Plan: TBD  Consultants:   General Surgery  GI  ROS:  Denies dyspnea, N, V, CP, ab pain . Remainder 10-pt ROS is negative for all not previously mentioned.  Subjective: "I guess there isn't anything else to do."  Objective: Vitals:   10/03/19 0457 10/03/19 1341 10/03/19 2124 10/04/19 1320  BP: 110/71 98/63 108/71 103/69  Pulse: 85 90 85 86  Resp: 17  18 17   Temp:  98 F (36.7 C) 97.8 F (36.6 C) (!) 97.4 F (36.3 C) 97.8 F (36.6 C)  TempSrc: Axillary Oral Oral Oral  SpO2: 99% 100% 100% 100%  Weight:      Height:        Intake/Output Summary (Last 24 hours) at 10/04/2019 1358 Last data filed at 10/04/2019 1321 Gross per 24 hour  Intake  2097.43 ml  Output 860 ml  Net 1237.43 ml   Filed Weights   09/19/19 2200 09/23/19 0850  Weight: 70.3 kg 70.3 kg    Examination:  General:78 y.o.maleresting in bed in NAD Cardiovascular: RRR, +S1, S2, no m/g/r, equal pulses throughout Respiratory: CTABL, no w/r/r GI: BS+, NDNT, no masses noted, ostomy noted MSK: No e/c/c Neuro: Alert to name, follows commands Psyc: Appropriate interaction and affect, calm/cooperative   Data Reviewed: I have personally reviewed following labs and imaging studies.  CBC: Recent Labs  Lab 09/28/19 0348 09/30/19 0329 10/01/19 0337 10/04/19 0438  WBC 12.2* 7.4 6.5 6.3  NEUTROABS  --  6.1  --  4.9  HGB 11.6* 10.4* 10.5* 10.7*  HCT 36.5* 33.7* 32.3* 33.4*  MCV 97.6 98.8 95.8 95.7  PLT 306 279 275 0000000   Basic Metabolic Panel: Recent Labs  Lab 09/28/19 0348 09/29/19 0405 09/30/19 0329 10/03/19 0450 10/04/19 0438  NA 150* 146* 139 137 133*  K 3.4* 3.7 4.0 4.1 4.0  CL 114* 114* 112* 108 105  CO2 27 25 23 23 22   GLUCOSE 127* 127* 127* 120* 101*  BUN 18 19 16 11 11   CREATININE 0.72 0.72 0.70 0.65 0.73  CALCIUM 8.0* 7.7* 7.4* 7.7* 7.7*  MG 2.0 1.7 1.8 1.7 1.6*  PHOS 2.5 2.7 2.7 3.3 3.1   GFR: Estimated Creatinine Clearance: 71.3 mL/min (by C-G formula based on SCr of 0.73 mg/dL). Liver Function Tests: Recent Labs  Lab 09/30/19 0329 10/03/19 0450 10/04/19 0438  AST 41 43*  --   ALT 21 29  --   ALKPHOS 63 101  --   BILITOT 0.4 0.5  --   PROT 4.4* 4.9*  --   ALBUMIN 1.4* 1.4* 1.7*   No results for input(s): LIPASE, AMYLASE in the last 168 hours. No results for input(s): AMMONIA in the last 168 hours. Coagulation Profile: Recent Labs  Lab 09/30/19 0329  INR 1.2   Cardiac Enzymes: No results for input(s): CKTOTAL, CKMB, CKMBINDEX, TROPONINI in the last 168 hours. BNP (last 3 results) No results for input(s): PROBNP in the last 8760 hours. HbA1C: No results for input(s): HGBA1C in the last 72 hours. CBG: Recent Labs   Lab 10/03/19 2122 10/04/19 0008 10/04/19 0508 10/04/19 0803 10/04/19 1207  GLUCAP 104* 112* 113* 101* 103*   Lipid Profile: No results for input(s): CHOL, HDL, LDLCALC, TRIG, CHOLHDL, LDLDIRECT in the last 72 hours. Thyroid Function Tests: No results for input(s): TSH, T4TOTAL, FREET4, T3FREE, THYROIDAB in the last 72 hours. Anemia Panel: No results for input(s): VITAMINB12, FOLATE, FERRITIN, TIBC, IRON, RETICCTPCT in the last 72 hours. Sepsis Labs: No results for input(s): PROCALCITON, LATICACIDVEN in the last 168 hours.  No results found for this or any previous visit (from the past 240 hour(s)).    Radiology Studies: No results found.   Scheduled Meds: . Chlorhexidine Gluconate Cloth  6 each Topical Daily  . docusate sodium  100 mg Oral Daily  . enoxaparin (LOVENOX) injection  40 mg Subcutaneous Q24H  . feeding supplement (ENSURE ENLIVE)  237 mL Oral TID BM  . magnesium oxide  400 mg Oral BID  . multivitamin with minerals  1 tablet Oral Daily  . polyethylene glycol  17 g Oral Daily  . tamsulosin  0.4 mg Oral Daily   Continuous Infusions: . dextrose 5 % and 0.45% NaCl 75 mL/hr at 10/04/19 0300     LOS: 15 days    Time spent: 25 minutes spent in the coordination of care today.   Jonnie Finner, DO [facility] Pager [phone]  If 7PM-7AM, please contact night-coverage [email or web address] Password [email or web address] 10/04/2019, 1:58 PM

## 2019-10-04 NOTE — Consult Note (Signed)
[place] Nurse ostomy follow up Stoma type/location: LMQ colostomy Patient discharging to [facility] when [facility] status confirmed.  4 pouch sets in room. 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 States she is too busy today.  Stomal assessment/size: 2 1/4" stoma pink and moist Peristomal assessment: intact Treatment options for stomal/peristomal skin: 2 3/4" pouch with barrier ring Output soft brown stool Ostomy pouching: 2pc. 2 3/4" pouch with barrier ring.   Education provided: Patient minimally participative in care.  Will need ongoing encouragement for self care.  Enrolled patient in [place] Start Discharge program: Yes Will follow through admission.  Domenic Moras MSN, RN, FNP-BC [facility] Wound, Ostomy, Continence Nurse Pager [phone]

## 2019-10-04 NOTE — Discharge Summary (Addendum)
. Physician Discharge Summary  Charles Espinoza [id number] DOB: [birth year] DOA: 09/19/2019  PCP: Rogers Blocker, MD  Admit date: 09/19/2019 Discharge date: 10/04/2019  Admitted From: Home Disposition:  Discharged to [facility]  Recommendations for Outpatient Follow-up:  1. Follow up with PCP in 1-2 weeks 2. Please obtain BMP/CBC in one week 3. Follow up with [facility] for urinary retention/foley care. Call for appt.  4. Follow up with Oncology as scheduled.  Discharge Condition: Stable  CODE STATUS: FULL   Brief/Interim Summary: Male with prior history of prostate cancer s/p radiation in January 2015, COPD presents to ED with generalized abdominal pain for 1 month associated with abdominal distention and watery bowel movements. CT of the abdomen and pelvis shows volvulus and complex sigmoid collection. Patient was started on broad-spectrum IV antibiotics and general surgery consulted. Patient underwent exploratory laparotomy with diverting colostomy on 09/23/2019. Palliative care consulted for goals of care discussion. Family would like to pursue full scope of treatment. Meanwhile pt s nausea, abd pain has improved and NG tube was removed by surgery and started on clearsand advanced to full liquid diet.As his bowels have decompressed, IR consulted for liver biopsy . He underwent liver biopsy  10/14: No acute events ON. Eager to go home. 10/15: Says he felt rough last night. 10/16: Failed voiding trial. 10/17: No acute events ON. Ok to discharge.  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  - Patient's abdominal distention and abdominal has improved.  - NG tube was discontinued by surgery and he was started on clears and advanced to full liquid diet.  - Patient has colostomy bag with some gas and small stool.   - MRI of the abdomen shows right hepatic lesion favoring metastatic lesion from the colon cancer  - IR consulted for CT-guided liver biopsy as his abdominal distention is improving. 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 No changes for the above.     - 10/17: No acute events ON. [place] for discharge.  Anemia of chronic disease: - No signs of bleeding.  - Anemia panel shows low iron .  - Iron supplementation added; monitor   History of prostate cancer  - Status post XRT in 2015  Acute Kidney injury probably secondary to prerenal/dehydration - Resolved, monitor  Hypernatremia - Resolved.   Severe protein-cal malnutrition   -Taper TPN, increase PO intake     - TPN off  Acute urinary retention:  - Probably secondary to ileus . Voiding trial was unsuccessful Foley has to be replaced. - voiding trial today; if unsuccessful, will replace foley and will have outpt urology follow up     - 10/16: failed voiding trial. Change bethanecol to flomax     - spoke with urology; advised pt to call office on Monday for follow up  on urinary retention     - 10/17: Will discharge with foley in place; call urology office Monday for follow up.  Hypokalemia Hypomagnesemia Hypophosphatemia - replete, follow up labs  Discharge Instructions   Allergies as of 10/05/2019      Reactions   Penicillins    Did it involve swelling of the face/tongue/throat,  SOB, or low BP? n/a Did it involve sudden or severe rash/hives, skin peeling, or any reaction on the inside of your mouth or nose? n/a Did you need to seek medical attention at a hospital or doctor's office? n/a When did it last happen?year1972 If all above answers are "NO", may proceed with cephalosporin use.      Medication List    TAKE these medications   docusate sodium 100 MG capsule Commonly known as: COLACE Take 1 capsule (100 mg total) by mouth daily for 14 days. Start taking on: October 06, 2019   Durezol 0.05 % Emul Generic drug: Difluprednate Place 1 drop into the left eye daily.   feeding supplement (ENSURE ENLIVE) Liqd Take 237 mLs by mouth 3 (three) times daily between meals.   magnesium oxide 400 (241.3 Mg) MG tablet Commonly known as: MAG-OX Take 1 tablet (400 mg total) by mouth 2 (two) times daily for 14 days.   multivitamin with minerals Tabs tablet Take 1 tablet by mouth daily. Start taking on: October 06, 2019   oxyCODONE 5 MG immediate release tablet Commonly known as: Oxy IR/ROXICODONE Take 1 tablet (5 mg total) by mouth every 4 (four) hours as needed for up to 5 days for moderate pain.   polyethylene glycol 17 g packet Commonly known as: MIRALAX / GLYCOLAX Take 17 g by mouth daily. Start taking on: October 06, 2019   tamsulosin 0.4 MG Caps capsule Commonly known as: FLOMAX Take 1 capsule (0.4 mg total) by mouth daily. Start taking on: October 06, 2019       Contact information for follow-up providers    [facility] Follow up.   Specialty: Home Health Services Contact information: [address] [place] [id number] [phone]        Coralie Keens, MD. Go on 10/18/2019.   Specialty: General Surgery Why: at 10:50am for follow up. Arrive at 10:30am to complete paperwork. Please bring photo ID and insurance card Contact information: [place]  [phone]             Contact information for after-discharge care    Destination    [facility] .   Service: Skilled Nursing Contact information: [address] [place] [phone]                 Allergies  Allergen Reactions  . Penicillins     Did it involve swelling of the face/tongue/throat, SOB, or low BP? n/a Did it involve sudden or severe rash/hives, skin peeling, or any reaction on the inside of your mouth or nose? n/a Did you need to seek medical attention at a hospital or doctor's office? n/a When did it last happen?[id number] If all above answers are "NO", may proceed with cephalosporin use.    Consultations:  General surgery  GI   Procedures/Studies: Ct Abdomen Pelvis Wo Contrast  Result Date: 09/22/2019 CLINICAL DATA:  78 year old male with free air on prior plain film EXAM: CT ABDOMEN AND PELVIS WITHOUT CONTRAST TECHNIQUE: Multidetector CT imaging of the abdomen and pelvis was performed following the  standard protocol without IV contrast. COMPARISON:  Plain film 09/22/2019, CT 09/19/2019 FINDINGS: Lower chest: Atelectasis at the lung bases with respiratory motion somewhat limiting the lung evaluation. Gastric tube terminates at the GE junction. Hepatobiliary: The previous identified right liver mass is incompletely characterized on the current CT. Unremarkable gallbladder. No radiopaque cholelithiasis. Pancreas: Unremarkable Spleen: Unremarkable Adrenals/Urinary Tract: Unremarkable adrenal glands. Fullness in the bilateral collecting system with distention of the urinary. Bilateral cysts of the kidneys, likely Bosniak 1 cysts. Stomach/Bowel: Stomach is decompressed. Similar degree mild distention of small bowel loops. Similar degree of colonic distention with moderate to large stool burden. The irregular appearance of the distal rectum, potentially a rectal carcinoma and better characterized on prior CT, is similar in  appearance. There is evidence of ongoing abscess in the perirectal region, with fluid and gas collection measuring transversely 7.9 cm. Evidence of free air in the left and right subdiaphragmatic region corresponding to findings on prior plain film. Vascular/Lymphatic: Mild atherosclerotic changes of the aorta and iliac arteries. Reproductive: Prostate measures 5.2 cm transverse diameter with impression on the bladder base. Other: Body wall edema and mesenteric edema/anasarca. Musculoskeletal: . no acute displaced fracture. Degenerative changes of the spine. IMPRESSION: CT demonstrates free air in the bilateral subdiaphragmatic region, compatible with hollow viscus perforation. The precise site of perforation is not identified on the current CT, however, the presumed diagnosis is a perforated and obstructing rectal colon carcinoma, of which this would be the most likely source of the perforation. Persisting evidence of colonic obstruction, with moderate to large stool burden. Metastatic disease to the liver again demonstrated. Perirectal abscess measuring 7.9 cm at the site of the presumed rectal cancer. These results were discussed by telephone at the time of interpretation on 09/22/2019 at 3:14 pm with Dr. Georganna Skeans of surgery. Distention of urinary bladder, which is the presumed etiology of mild bilateral dilation of the collecting system. Body wall/mesenteric edema/anasarca. Additional ancillary findings as above. Electronically Signed   By: Corrie Mckusick D.O.   On: 09/22/2019 15:17   Dg Abd 1 View  Result Date: 09/23/2019 CLINICAL DATA:  Nasogastric tube advancement. Recent exploratory laparotomy with colostomy. EXAM: ABDOMEN - 1 VIEW COMPARISON:  One view abdomen 09/23/2019 and 09/21/2019. FINDINGS: 1309 hours. The enteric tube has been advanced with the tip at the level of the gastric fundus and the side hole near the GE junction. Mild diffuse bowel distention appears unchanged. There is mild  atelectasis at both lung bases. IMPRESSION: The enteric tube has been advanced, tip now overlying the gastric fundus. Electronically Signed   By: Richardean Sale M.D.   On: 09/23/2019 13:34   X-ray Abdomen Ap  Result Date: 09/23/2019 CLINICAL DATA:  Reason for exam: S/P abdominal surgery, follow up exam RN informs there is questionable displacement of NG tube. S/P EXPLORATORY LAPAROTOMY EXAM: ABDOMEN - 1 VIEW COMPARISON:  Abdominal radiograph 09/21/2019 FINDINGS: The nasogastric tube side port tip projects over the mid esophagus. Diffuse gaseous distention of bowel loops similar to prior exams. Lung bases are clear. No acute finding in the visualized skeleton. IMPRESSION: Nasogastric tube tip projects over the mid esophagus and could be advanced approximately 13 cm to reach the stomach. Electronically Signed   By: Audie Pinto M.D.   On: 09/23/2019 12:22   Ct Chest Wo Contrast  Result Date: 09/20/2019 CLINICAL DATA:  Staging for colon carcinoma. EXAM: CT CHEST WITHOUT CONTRAST TECHNIQUE: Multidetector CT imaging of the chest was performed following the standard protocol without IV contrast.  COMPARISON:  MRI of the abdomen, 09/20/2019. CT of the abdomen pelvis, 09/19/2019. FINDINGS: Cardiovascular: Heart is normal in size and configuration. No pericardial effusion. No coronary artery calcifications. Great vessels are normal in caliber. Minor aortic atherosclerotic calcifications. Mediastinum/Nodes: There is a lobulated masses extends from the AP window region of the mediastinum into the left upper lobe. It measures 6.1 x 4.9 cm transversely at the level of the AP window. It extends for a length of 8.5 cm obliquely from inferomedial to anterolateral. It abuts the left anteromedial pleural margin, which may be invaded. 1.6 cm predominantly hypoattenuating posterior left thyroid lobe nodule. No neck base or axillary masses or enlarged lymph nodes. No other mediastinal masses.  No mediastinal or hilar  adenopathy. Trachea and esophagus are unremarkable. Lungs/Pleura: Linear/reticular opacities extend from the left upper lobe mass to the anterior, anterolateral and anteromedial pleural margins. There are no other lung masses. No nodules. Linear opacities are noted in the right lower lobe consistent with atelectasis. Mild centrilobular emphysema. No evidence of pneumonia or pulmonary edema. No pleural effusion or pneumothorax. Upper Abdomen: Poorly defined mass in the right lobe of the liver better seen on the previous day's study. Distention of the visualized bowel prominently with gas. No acute findings. Musculoskeletal: Degenerative changes of the visualized lower cervical spine. No fracture or acute finding. No osteoblastic or osteolytic lesions. IMPRESSION: 1. Mass extends from the AP window of the mediastinum into the left upper lobe consistent with neoplastic disease, which could be primary or metastatic. 2. There are no other lung masses or nodules to support metastatic disease. 3. No acute findings. 4. Incidental finding a 1.6 cm thyroid nodule on the left. Depending on the outcome of the patient's other findings on this chest CT and prior abdomen and pelvis CT, further evaluation with thyroid ultrasound. If patient is clinically hyperthyroid, consider nuclear medicine thyroid uptake and scan. 5. Mild centrilobular emphysema.  Mild aortic atherosclerosis. Aortic Atherosclerosis (ICD10-I70.0) and Emphysema (ICD10-J43.9). Electronically Signed   By: Lajean Manes M.D.   On: 09/20/2019 13:55   Ct Abdomen Pelvis W Contrast  Result Date: 09/19/2019 CLINICAL DATA:  78 year old male with abdominal pain and swelling. EXAM: CT ABDOMEN AND PELVIS WITH CONTRAST TECHNIQUE: Multidetector CT imaging of the abdomen and pelvis was performed using the standard protocol following bolus administration of intravenous contrast. CONTRAST:  169mL OMNIPAQUE IOHEXOL 300 MG/ML  SOLN COMPARISON:  Abdominal radiograph dated  09/19/2019 and CT of the abdomen pelvis dated 05/17/2013 FINDINGS: Lower chest: The visualized lung bases are clear. There is mild emphysema. No intra-abdominal free air. No free fluid. Hepatobiliary: There is a 7.5 x 7.0 cm heterogeneously enhancing mass in the right lobe of the liver (segment VII). This mass is suboptimally characterized but concerning for a malignancy such as hepatocellular carcinoma. Further evaluation with MRI without and with contrast a nonemergent basis recommended. No intrahepatic biliary ductal dilatation. The gallbladder is unremarkable. Pancreas: Pancreas is grossly unremarkable as visualized. Spleen: Normal in size without focal abnormality. Adrenals/Urinary Tract: The adrenal glands are unremarkable. There is a 5 mm right renal inferior pole nonobstructing stone. There is no hydronephrosis on either side. Bilateral renal cysts measure up to 2.5 cm in the interpolar aspect of the right kidney. There is a punctate nonobstructing left renal upper pole calculus. There is symmetric enhancement and excretion of contrast by both kidneys. There is diffuse thickened appearance of bladder wall which may be partly related to underdistention or represent cystitis. An infiltrative process is not excluded.  Stomach/Bowel: There is segmental thickening of the sigmoid colon. There is a 7.3 x 4.5 cm collection containing debris and air within the pelvis posterior to the urinary bladder and anterior to the rectosigmoid. There is mild diffuse inflammatory changes of the pelvis and perirectal fat. This complex collection is not well characterized but may represent an abscess, and inflamed large diverticula, or a malignancy with a large ulceration. There is distention of the colon with air and stool proximal to the thickened segment of the sigmoid colon. The cecum is located in the mid abdomen. No evidence of twisting or volvulus. There is no definite evidence of small-bowel obstruction. The appendix is  normal. Vascular/Lymphatic: There is moderate aortoiliac atherosclerotic disease. The IVC is unremarkable. No portal venous gas. There is no adenopathy. Reproductive: Mildly enlarged prostate gland with multiple fiducial markers. Prostate measures approximately 5.2 cm in transverse axial diameter. The seminal vesicles are symmetric. Other: There is loss of subcutaneous and mesenteric fat and cachexia. Musculoskeletal: No acute or significant osseous findings. IMPRESSION: 1. Segmental thickening of the sigmoid colon with adjacent inflammatory changes. A 7.3 x 4.5 cm complex collection containing debris and air may represent an abscess or a large malignant ulceration. CT of the pelvis with rectal contrast may provide better evaluation. The thickened appearance of the sigmoid colon may be infectious or inflammatory in etiology or represent infiltrative neoplasm. Further evaluation with sigmoidoscopy following resolution of acute inflammatory changes recommended. 2. Diffuse distention of the colon proximal to the thickened segment of sigmoid. No valvular. No evidence of small-bowel obstruction. Normal appendix. 3. A 7.5 x 7.0 cm heterogeneously enhancing mass in the right lobe of the liver most consistent with malignancy such as hepatocellular carcinoma. Further evaluation with MRI without and with contrast a nonemergent basis recommended. 4. Aortic Atherosclerosis (ICD10-I70.0). 5. Cachexia. Electronically Signed   By: Anner Crete M.D.   On: 09/19/2019 21:15   Mr Liver W Wo Contrast  Addendum Date: 09/20/2019   ADDENDUM REPORT: 09/20/2019 08:31 ADDENDUM: These results were called by telephone at the time of interpretation on 09/20/2019 at 8:30 am to provider Dr. Horris Latino , who verbally acknowledged these results. Low T2 signal likely relates to fibrotic changes within this lesion. Electronically Signed   By: Zetta Bills M.D.   On: 09/20/2019 08:31   Result Date: 09/20/2019 CLINICAL DATA:  History hepatic  malignancy presenting to the emergency department generalized abdominal pain, worsening past month. EXAM: MRI ABDOMEN WITHOUT AND WITH CONTRAST TECHNIQUE: Multiplanar multisequence MR imaging of the abdomen was performed both before and after the administration of intravenous contrast. CONTRAST:  58mL GADAVIST GADOBUTROL 1 MMOL/ML IV SOLN COMPARISON:  CT evaluation 09/20/2019. FINDINGS: Lower chest: No signs of pleural effusion or definite pulmonary abnormality. Hepatobiliary: 7.9 x 7.5 7.4 cm mass in the posterior right hepatic lobe is poorly enhancing, mainly in the periphery. Lesion displays mixed T2 signal. No signs chronic liver disease portal hypertension. Cholelithiasis. No biliary ductal dilation. Pancreas: No mass, inflammatory changes, or other parenchymal abnormality identified. Spleen:  Within normal limits in size and appearance. Adrenals/Urinary Tract: No masses identified. No evidence of hydronephrosis. Bilateral renal cysts. Stomach/Bowel: Bowel distention is similar to the previous exam. The sigmoid colon not assessed on the current examination. Vascular/Lymphatic: Patent abdominal vasculature. Retroaortic left renal vein. No adenopathy. Other:  None. Musculoskeletal: No signs acute musculoskeletal finding. IMPRESSION: Large right hepatic mass lesion which is poorly enhancing. Constellation of findings favors metastatic lesion from obstructing colon cancer leading to contained perforation adjacent to  the sigmoid colon. Primary hepatic neoplasm is felt less likely. No signs of vascular invasion or adenopathy. The presence of susceptibility in the setting of bowel distension limits assessment. Electronically Signed: By: Zetta Bills M.D. On: 09/20/2019 07:59   US Biopsy (liver)  Result Date: 09/30/2019 INDICATION: 78 year old with a recent perforated bowel and concern for colorectal cancer. History of prostate cancer. Patient has a large liver lesion and need tissue diagnosis. EXAM:  ULTRASOUND-GUIDED LIVER LESION BIOPSY MEDICATIONS: None. ANESTHESIA/SEDATION: Moderate (conscious) sedation was employed during this procedure. A total of Versed 1.0 mg and Fentanyl 50 mcg was administered intravenously. Moderate Sedation Time: 21 minutes. The patient's level of consciousness and vital signs were monitored continuously by radiology nursing throughout the procedure under my direct supervision. FLUOROSCOPY TIME:  None COMPLICATIONS: None immediate. PROCEDURE: Informed written consent was obtained from the patient after a thorough discussion of the procedural risks, benefits and alternatives. All questions were addressed. Maximal Sterile Barrier Technique was utilized including caps, mask, sterile gowns, sterile gloves, sterile drape, hand hygiene and skin antiseptic. A timeout was performed prior to the initiation of the procedure. Liver was evaluated with ultrasound. The hepatic lesion was identified. The right side of the abdomen was prepped with chlorhexidine and sterile field was created. Skin and soft tissues were anesthetized with 1% lidocaine. 64 gauge coaxial needle was directed into the liver lesion with real-time ultrasound guidance. Three core biopsies were obtained with an 18 gauge core device. Gel-Foam slurry was injected along the needle track following the biopsies. Bandage placed over the puncture site. FINDINGS: Trace perihepatic ascites near the hepatic dome. Not enough fluid for a paracentesis. Large hyperechoic liver mass in the right hepatic lobe. Needle position confirmed within the liver lesion. Adequate core biopsies obtained. No significant bleeding or hematoma following the core biopsies. IMPRESSION: Ultrasound-guided core biopsies of the hepatic mass. Electronically Signed   By: Markus Daft M.D.   On: 09/30/2019 13:16   Dg Abd 2 Views  Result Date: 09/27/2019 CLINICAL DATA:  Follow-up bowel obstruction. EXAM: ABDOMEN - 2 VIEW COMPARISON:  Radiograph of September 25, 2019.  FINDINGS: Nasogastric tube tip is seen in proximal stomach. Stable diffuse gaseous dilatation of large and small bowel is noted. Surgical drain is noted in the pelvis. Phleboliths are noted in the pelvis. No definite pneumoperitoneum is noted. IMPRESSION: Grossly stable large and small bowel dilatation is noted concerning for ileus or less likely obstruction. Electronically Signed   By: Marijo Conception M.D.   On: 09/27/2019 11:31   Dg Abd 2 Views  Addendum Date: 09/22/2019   ADDENDUM REPORT: 09/22/2019 12:09 ADDENDUM: Above findings discussed with Dr. Johnathan Hausen at 11:34 a.m. Electronically Signed   By: Marin Olp M.D.   On: 09/22/2019 12:09   Result Date: 09/22/2019 CLINICAL DATA:  Colonic obstruction. EXAM: ABDOMEN - 2 VIEW COMPARISON:  09/21/2019 FINDINGS: Nasogastric tube has been pulled back as tip is located in the expected region of the distal esophagus just above the gastroesophageal junction. This could be advanced 8-10 cm. Continued air-filled loops of large and small bowel with moderate fecal retention over the right colon and distal descending colon. Collection of air under the right hemidiaphragm on the upright film which may represent free peritoneal air. No definite dilated small bowel loops. Persistent mildly distended bladder. Remainder the exam is unchanged. IMPRESSION: Persistent multiple air-filled loops of large and small bowel with moderate fecal retention over the right and left colon. No definite dilated small bowel loops. Possible  free air under the right hemidiaphragm. Right lateral decubitus film versus CT could confirm this finding. Nasogastric tube with tip over the region of the distal esophagus just above the gastroesophageal junction. This could be advanced 8-10 cm. Electronically Signed: By: Marin Olp M.D. On: 09/22/2019 11:25   Dg Abd 2 Views  Result Date: 09/19/2019 CLINICAL DATA:  Distended abdomen EXAM: ABDOMEN - 2 VIEW COMPARISON:  None. FINDINGS: Markedly  distended colon extending to the rectum. Mild amount of small bowel gas. No free air. No abnormal calcifications or skeletal lesion. IMPRESSION: Markedly distended colon to the rectum. Findings most compatible with colonic ileus although rectal obstruction possible. Electronically Signed   By: Franchot Gallo M.D.   On: 09/19/2019 16:01   Dg Abd Portable 1v  Result Date: 09/25/2019 CLINICAL DATA:  Acute onset abdominal distension today. EXAM: PORTABLE ABDOMEN - 1 VIEW COMPARISON:  Single-view of the abdomen 09/23/2019 and 09/22/2019. FINDINGS: NG tube remains in place in good position. Diffuse gaseous distention of small and large bowel persists. Large colonic stool burden is again seen. Tube projecting over the pelvis with its tip over the left ilium is consistent with a surgical drain. IMPRESSION: Diffuse gaseous distention of small and large bowel compatible with ileus. Large colonic stool burden noted. Electronically Signed   By: Inge Rise M.D.   On: 09/25/2019 11:02   Dg Abd Portable 1v  Result Date: 09/21/2019 CLINICAL DATA:  NG tube placement EXAM: PORTABLE ABDOMEN - 1 VIEW COMPARISON:  09/19/2019 FINDINGS: The enteric tube projects over the expected region of the gastric body. The tube is kinked at the side-hole. Again noted is extensive gaseous distention of loops of small bowel and colon scattered throughout the abdomen. There is no definite pneumatosis or free air, however evaluation for both is limited by the diffuse gaseous distension. The urinary bladder is significantly distended. IMPRESSION: 1. Enteric tube as above. 2. Persistent gaseous distension of small bowel and colon. 3. Distended urinary bladder. Electronically Signed   By: Constance Holster M.D.   On: 09/21/2019 13:20   [place] Ekg Site Rite  Result Date: 09/25/2019 If Site Rite image not attached, placement could not be confirmed due to current cardiac rhythm.     Subjective: "I hope I can."  Discharge Exam: Vitals:    10/03/19 1341 10/03/19 2124  BP: 98/63 108/71  Pulse: 90 85  Resp:  18  Temp: 97.8 F (36.6 C) (!) 97.4 F (36.3 C)  SpO2: 100% 100%   Vitals:   10/02/19 2214 10/03/19 0457 10/03/19 1341 10/03/19 2124  BP:  110/71 98/63 108/71  Pulse: 83 85 90 85  Resp: 18 17  18   Temp: 97.7 F (36.5 C) 98 F (36.7 C) 97.8 F (36.6 C) (!) 97.4 F (36.3 C)  TempSrc: Oral Axillary Oral Oral  SpO2: 100% 99% 100% 100%  Weight:      Height:        General:78 y.o.maleresting in bed in NAD Cardiovascular: RRR, +S1, S2, no m/g/r Respiratory: CTABL, no w/r/r, normal WOB GI: BS+, NDNT, no masses noted, ostomy noted MSK: No e/c/c Neuro: Alert to name, follows commands Psyc: Appropriate interaction and affect, calm/cooperative    The results of significant diagnostics from this hospitalization (including imaging, microbiology, ancillary and laboratory) are listed below for reference.     Microbiology: No results found for this or any previous visit (from the past 240 hour(s)).   Labs: BNP (last 3 results) No results for input(s): BNP in the last  8760 hours. Basic Metabolic Panel: Recent Labs  Lab 09/28/19 0348 09/29/19 0405 09/30/19 0329 10/03/19 0450 10/04/19 0438  NA 150* 146* 139 137 133*  K 3.4* 3.7 4.0 4.1 4.0  CL 114* 114* 112* 108 105  CO2 27 25 23 23 22   GLUCOSE 127* 127* 127* 120* 101*  BUN 18 19 16 11 11   CREATININE 0.72 0.72 0.70 0.65 0.73  CALCIUM 8.0* 7.7* 7.4* 7.7* 7.7*  MG 2.0 1.7 1.8 1.7 1.6*  PHOS 2.5 2.7 2.7 3.3 3.1   Liver Function Tests: Recent Labs  Lab 09/30/19 0329 10/03/19 0450 10/04/19 0438  AST 41 43*  --   ALT 21 29  --   ALKPHOS 63 101  --   BILITOT 0.4 0.5  --   PROT 4.4* 4.9*  --   ALBUMIN 1.4* 1.4* 1.7*   No results for input(s): LIPASE, AMYLASE in the last 168 hours. No results for input(s): AMMONIA in the last 168 hours. CBC: Recent Labs  Lab 09/28/19 0348 09/30/19 0329 10/01/19 0337 10/04/19 0438  WBC 12.2* 7.4 6.5 6.3   NEUTROABS  --  6.1  --  4.9  HGB 11.6* 10.4* 10.5* 10.7*  HCT 36.5* 33.7* 32.3* 33.4*  MCV 97.6 98.8 95.8 95.7  PLT 306 279 275 349   Cardiac Enzymes: No results for input(s): CKTOTAL, CKMB, CKMBINDEX, TROPONINI in the last 168 hours. BNP: Invalid input(s): POCBNP CBG: Recent Labs  Lab 10/03/19 1554 10/03/19 2122 10/04/19 0008 10/04/19 0508 10/04/19 0803  GLUCAP 101* 104* 112* 113* 101*   D-Dimer No results for input(s): DDIMER in the last 72 hours. Hgb A1c No results for input(s): HGBA1C in the last 72 hours. Lipid Profile No results for input(s): CHOL, HDL, LDLCALC, TRIG, CHOLHDL, LDLDIRECT in the last 72 hours. Thyroid function studies No results for input(s): TSH, T4TOTAL, T3FREE, THYROIDAB in the last 72 hours.  Invalid input(s): FREET3 Anemia work up No results for input(s): VITAMINB12, FOLATE, FERRITIN, TIBC, IRON, RETICCTPCT in the last 72 hours. Urinalysis    Component Value Date/Time   COLORURINE YELLOW 09/26/2019 1941   APPEARANCEUR HAZY (A) 09/26/2019 1941   LABSPEC 1.026 09/26/2019 1941   PHURINE 5.0 09/26/2019 1941   GLUCOSEU NEGATIVE 09/26/2019 1941   HGBUR LARGE (A) 09/26/2019 [place] NEGATIVE 09/26/2019 [place] NEGATIVE 09/26/2019 1941   PROTEINUR 30 (A) 09/26/2019 1941   NITRITE NEGATIVE 09/26/2019 1941   LEUKOCYTESUR TRACE (A) 09/26/2019 1941   Sepsis Labs Invalid input(s): PROCALCITONIN,  WBC,  LACTICIDVEN Microbiology No results found for this or any previous visit (from the past 240 hour(s)).   Time coordinating discharge: 35 minutes  SIGNED:   Jonnie Finner, DO  [facility] 10/04/2019, 11:17 AM Pager   If 7PM-7AM, please contact night-coverage [email or web address] Password [email or web address]

## 2019-10-04 NOTE — Progress Notes (Signed)
PT Cancellation Note  Patient Details Name: Pharoah Slavin MRN: [id number] DOB: [birth year]   Cancelled Treatment:    Reason Eval/Treat Not Completed: Patient declined, no reason specified patient declines due to likely going to SNF today from what his wife has said. Will attempt to try back if he is still in house and if time/schedule allow later on.    Deniece Ree PT, DPT, CBIS  Supplemental Physical Therapist [facility]    Pager [phone] Acute Rehab Office [phone]

## 2019-10-04 NOTE — TOC Progression Note (Addendum)
Transition of Care [facility]) - Progression Note    Patient Details  Name: Charles Espinoza MRN: [id number] Date of Birth: 01-29-1941  Transition of Care [facility]) CM/SW [place], [place] Phone Number: 10/04/2019, 10:03 AM  Clinical Narrative:    Pt COVID still pending, has insurance approval [phone])  to admit to [facility] when this has resulted.    Expected Discharge Plan: Skilled Nursing Facility Barriers to Discharge: Continued Medical Work up(weaning TPN; COVID pending.)  Expected Discharge Plan and Services Expected Discharge Plan: [place]   Discharge Planning Services: CM Consult Post Acute Care Choice: Home Health Living arrangements for the past 2 months: Single Family Home           DME Arranged: N/A  HH Arranged: NA  Social Determinants of Health (SDOH) Interventions    Readmission Risk Interventions Readmission Risk Prevention Plan 10/03/2019  Transportation Screening Complete  PCP or Specialist Appt within 5-7 Days Not Complete  Not Complete comments plan for SNF  Home Care Screening Complete  Medication Review (RN CM) Complete  Some recent data might be hidden

## 2019-10-05 LAB — GLUCOSE, CAPILLARY
Glucose-Capillary: 105 mg/dL — ABNORMAL HIGH (ref 70–99)
Glucose-Capillary: 113 mg/dL — ABNORMAL HIGH (ref 70–99)
Glucose-Capillary: 117 mg/dL — ABNORMAL HIGH (ref 70–99)
Glucose-Capillary: 285 mg/dL — ABNORMAL HIGH (ref 70–99)

## 2019-10-05 MED ORDER — ADULT MULTIVITAMIN W/MINERALS CH: 1.0000 | ORAL_TABLET | Freq: Every day | ORAL | 0 refills | Status: AC

## 2019-10-05 MED ORDER — DOCUSATE SODIUM 100 MG PO CAPS: 100.0000 mg | ORAL_CAPSULE | Freq: Every day | ORAL | 0 refills | Status: DC

## 2019-10-05 MED ORDER — TAMSULOSIN HCL 0.4 MG PO CAPS: 0.4000 mg | ORAL_CAPSULE | Freq: Every day | ORAL | 1 refills | Status: AC

## 2019-10-05 MED ORDER — POLYETHYLENE GLYCOL 3350 17 G PO PACK: 17.0000 g | PACK | Freq: Every day | ORAL | 0 refills | Status: AC

## 2019-10-05 MED ORDER — MAGNESIUM OXIDE 400 (241.3 MG) MG PO TABS: 400.0000 mg | ORAL_TABLET | Freq: Two times a day (BID) | ORAL | 0 refills | Status: DC

## 2019-10-05 MED ORDER — ENSURE ENLIVE PO LIQD: 237.0000 mL | Freq: Three times a day (TID) | ORAL | 12 refills | Status: AC

## 2019-10-05 MED ORDER — OXYCODONE HCL 5 MG PO TABS: 5.0000 mg | ORAL_TABLET | ORAL | 0 refills | Status: AC | PRN

## 2019-10-05 NOTE — Progress Notes (Signed)
Patient discharged to [facility], SNF. Patient report phoned to [facility]. Patient belongings and AVS summary transported with patient via [place].  Patient  transported off unit by [facility] at 1322.

## 2019-10-05 NOTE — TOC Transition Note (Signed)
Transition of Care [facility]) - CM/SW Discharge Note   Patient Details  Name: Charles Espinoza MRN: [id number] Date of Birth: 08/17/1941  [facility] CM/SW Contact:  Wende Neighbors, LCSW Phone Number: 10/05/2019, 12:16 PM   Clinical Narrative:   Patient to discharge to [facility] via [facility]. RN to call [phone] (rm [phone]) for report. [facility] has been called for pick up     Final next level of care: [place] Barriers to Discharge: No Barriers Identified   Patient Goals and CMS Choice Patient states their goals for this hospitalization and ongoing recovery are:: to get stronger CMS Medicare.gov Compare Post Acute Care list provided to:: Patient Represenative (must comment)(spouse) Choice offered to / list presented to : Patient, Spouse  Discharge Placement              Patient chooses bed at: [facility] Patient to be transferred to facility by: ptar Name of family member notified: attemted to call wife twice Patient and family notified of of transfer: 10/05/19  Discharge Plan and Services   Discharge Planning Services: CM Consult Post Acute Care Choice: Home Health          DME Arranged: N/A         HH Arranged: NA          Social Determinants of Health (SDOH) Interventions     Readmission Risk Interventions Readmission Risk Prevention Plan 10/03/2019  Transportation Screening Complete  PCP or Specialist Appt within 5-7 Days Not Complete  Not Complete comments plan for SNF  Home Care Screening Complete  Medication Review (RN CM) Complete  Some recent data might be hidden

## 2019-10-09 ENCOUNTER — Inpatient Hospital Stay: Payer: Medicare Other | Attending: Nurse Practitioner | Admitting: Nurse Practitioner

## 2019-10-09 ENCOUNTER — Telehealth: Payer: Self-pay

## 2019-10-09 ENCOUNTER — Other Ambulatory Visit: Payer: Self-pay

## 2019-10-09 ENCOUNTER — Encounter: Payer: Self-pay | Admitting: Nurse Practitioner

## 2019-10-09 VITALS — BP 104/65 | HR 97 | Temp 98.3°F | Resp 15 | Ht 68.0 in | Wt 119.7 lb

## 2019-10-09 DIAGNOSIS — Z923 Personal history of irradiation: Secondary | ICD-10-CM | POA: Diagnosis not present

## 2019-10-09 DIAGNOSIS — Z8546 Personal history of malignant neoplasm of prostate: Secondary | ICD-10-CM | POA: Diagnosis not present

## 2019-10-09 DIAGNOSIS — R5383 Other fatigue: Secondary | ICD-10-CM | POA: Insufficient documentation

## 2019-10-09 DIAGNOSIS — L89159 Pressure ulcer of sacral region, unspecified stage: Secondary | ICD-10-CM | POA: Diagnosis not present

## 2019-10-09 DIAGNOSIS — R338 Other retention of urine: Secondary | ICD-10-CM | POA: Diagnosis not present

## 2019-10-09 DIAGNOSIS — F17210 Nicotine dependence, cigarettes, uncomplicated: Secondary | ICD-10-CM

## 2019-10-09 DIAGNOSIS — E46 Unspecified protein-calorie malnutrition: Secondary | ICD-10-CM | POA: Insufficient documentation

## 2019-10-09 DIAGNOSIS — N401 Enlarged prostate with lower urinary tract symptoms: Secondary | ICD-10-CM | POA: Insufficient documentation

## 2019-10-09 DIAGNOSIS — J449 Chronic obstructive pulmonary disease, unspecified: Secondary | ICD-10-CM

## 2019-10-09 DIAGNOSIS — C787 Secondary malignant neoplasm of liver and intrahepatic bile duct: Secondary | ICD-10-CM | POA: Insufficient documentation

## 2019-10-09 DIAGNOSIS — Z79899 Other long term (current) drug therapy: Secondary | ICD-10-CM | POA: Diagnosis not present

## 2019-10-09 DIAGNOSIS — Z933 Colostomy status: Secondary | ICD-10-CM | POA: Diagnosis not present

## 2019-10-09 DIAGNOSIS — C20 Malignant neoplasm of rectum: Secondary | ICD-10-CM

## 2019-10-09 NOTE — Progress Notes (Addendum)
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 Findings most compatible with colonic ileus although rectal obstruction possible.   09/19/2019 Imaging   CT AP with contrast IMPRESSION: 1. Segmental thickening of the sigmoid colon with adjacent inflammatory changes. A 7.3 x 4.5 cm complex collection containing debris and air may represent an abscess or a large malignant ulceration. CT of the pelvis with rectal contrast may provide better evaluation. The thickened appearance of the sigmoid colon may be infectious or inflammatory in etiology or represent infiltrative neoplasm. Further evaluation with sigmoidoscopy following resolution of acute inflammatory changes recommended. 2. Diffuse distention of the colon proximal to the thickened segment of sigmoid. No valvular. No evidence of small-bowel obstruction. Normal appendix. 3. A 7.5 x 7.0 cm heterogeneously enhancing mass in the right lobe of the liver most consistent with malignancy such as hepatocellular carcinoma. Further evaluation with MRI without and with contrast a nonemergent basis recommended. 4. Aortic Atherosclerosis (ICD10-I70.0). 5. Cachexia.       09/20/2019 Imaging   MRI liver with and without contrast  hepatobiliary: 7.9 x 7.5 7.4 cm mass in the posterior right hepatic lobe is poorly enhancing, mainly in the periphery. Lesion displays mixed T2 signal. No signs chronic liver disease portal hypertension. Cholelithiasis. No biliary ductal dilation.  IMPRESSION: Large right hepatic mass  lesion which is poorly enhancing. Constellation of findings favors metastatic lesion from obstructing colon cancer leading to contained perforation adjacent to the sigmoid colon. Primary hepatic neoplasm is felt less likely.   No signs of vascular invasion or adenopathy. The presence of susceptibility in the setting of bowel distension limits assessment.     09/20/2019 Imaging   CT chest without contrast IMPRESSION: 1. Mass extends from the AP window of the mediastinum into the left upper lobe consistent with neoplastic disease, which could be primary or metastatic. 2. There are no other lung masses or nodules to support metastatic disease. 3. No acute findings. 4. Incidental finding a 1.6 cm thyroid nodule on the left. Depending on the outcome of the patient's other findings on this chest CT and prior abdomen and pelvis CT, further evaluation with thyroid ultrasound. If patient is clinically hyperthyroid, consider nuclear medicine thyroid uptake and scan. 5. Mild centrilobular emphysema.  Mild aortic atherosclerosis.   09/22/2019 Imaging   CT AP without contrastIMPRESSION: CT demonstrates free air in the bilateral subdiaphragmatic region, compatible with hollow viscus perforation. The precise site of perforation is not identified on the current CT, however, the presumed diagnosis is a perforated and obstructing rectal colon carcinoma, of which this would be the most likely source of the perforation.   Persisting evidence of colonic obstruction, with moderate to large stool burden.   Metastatic disease to the liver again demonstrated.   Perirectal abscess measuring 7.9 cm at the site of the presumed rectal cancer.   Distention of urinary bladder, which is the presumed etiology of mild bilateral dilation of the collecting system.   Body wall/mesenteric edema/anasarca.   Additional ancillary findings as above.     09/30/2019 Initial Biopsy   DIAGNOSIS:   A. LIVER,  RIGHT HEPATIC  LOBE, BIOPSY:  - Adenocarcinoma, see comment.  COMMENT:  Immunohistochemistry is positive for cytokeratin 20 and CDX-2.  Cytokeratin 20 is negative. The findings are consistent with metastatic  colorectal primary. There is likely sufficient tissue for additional  studies if requested (Block A2).    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 male who presented to ED on 09/19/19 complaining of 1 month history of abdominal pain and distention, diarrhea, anorexia with pounds weight loss nearing 30 pounds to date.  Abdominal x-ray showed markedly distended colon, compatible with colonic ileus versus rectal obstruction.  CT AP on 10/1 showed segmental thickening of the sigmoid colon and a 7.3 x 4.5 complex collection containing debris concerning for abscess versus large malignant ulceration.  Additionally, there is a 7.5 x 7.0 cm enhancing mass in the right lobe of the liver, consistent with malignancy.  Liver MRI on 10/2 again noted to be 7.9 x 7.5 x7.4 cm mass in the posterior right hepatic lobe which favored metastatic lesion from the obstructing colon cancer with contained perforation.  There were no signs of vascular invasion or adenopathy.  Staging work-up with CT chest on 10/2 showed a mass extending from the AP window of the mediastinum into the left upper lobe, consistent with malignancy ; other findings to support metastatic disease.  There was an incidental finding of a 1.6 cm left thyroid nodule.  The time of his work-up PSA in normal range at 0.53, normal AFP at 2.8, CEA elevated to 10.1.  He was taken to the OR on 09/23/2019 by Dr. Ninfa Linden he was found to have an obstructing rectal cancer that appeared to be fixated in the pelvis and going into the bladder, it was not felt to be resectable.  A large liver mass was also not amenable to a safe biopsy.  Dr. Ninfa Linden proceeded with a loop  descending colostomy.  He underwent IR liver biopsy on 09/30/2019 at his positive for adenocarcinoma with metastatic colorectal primary. He was discharged to SNF on 10/04/19 he continues to get PT and OT.    Past medical history is significant for prostate cancer status post radiation by Dr. Valere Dross at [facility] in 2015.  His last colonoscopy was more than 10 years ago.  His father, previously a Curator.  He lives with his spouse.  Before he presented to the ER he was healthy and active, worked in the yard, and independent of ADLs.  He is a current daily smoker, 1 pack/day x 60 years, drinks alcohol socially.  He smokes marijuana daily x50 years, no other drug use.  He has 3 healthy children.  Denies family history of any cancer.  Today he presents by himself from [facility] facility.  He reports minimal fatigue and a normal appetite, drinking 3 Ensure per day.  He is able to get up by himself and ambulate, has PT/OT daily.  He reports his abdominal wound is still open as is a pressure sore on his coccyx.  He has a urinary catheter in place, urine is amber.  He has 1-2 soft BM per colostomy daily, denies blood in stool.  Denies any abdominal or rectal pain.  He has cough and dyspnea related to "smoker's cough."  His wife was able to be on the phone for today's visit.  MEDICAL HISTORY:  Past Medical History:  Diagnosis Date   COPD (chronic obstructive pulmonary disease) ([place])  Elevated PSA    Hx of radiation therapy 09/25/13- 11/27/13   prostate 7800 cGy 39 sessions   Hyperlipidemia    Prostate cancer ([place]) 02/27/13   gleason 6, volume 133 cc   Prostatitis    Vitamin D deficiency     SURGICAL HISTORY: Past Surgical History:  Procedure Laterality Date   COLOSTOMY  09/23/2019   Procedure: DIVERTING LOOP DESCENDING COLOSTOMY;  Surgeon: Coralie Keens, MD;  Location: [facility] OR;  Service: General;;   LAPAROTOMY N/A 09/23/2019   Procedure: EXPLORATORY LAPAROTOMY;  Surgeon: Coralie Keens,  MD;  Location: [place];  Service: General;  Laterality: N/A;   PROSTATE BIOPSY  02/27/2013   TONSILLECTOMY      SOCIAL HISTORY: Social History   Socioeconomic History   Marital status: Married    Spouse name: Not on file   Number of children: 3   Years of education: Not on file   Highest education level: Not on file  Occupational History   Occupation: Curator, retired  Scientist, product/process development strain: Not on file   Food insecurity    Worry: Not on file    Inability: Not on Lexicographer needs    Medical: Not on file    Non-medical: Not on file  Tobacco Use   Smoking status: Current Every Day Smoker    Packs/day: 1.00    Years: 60.00    Pack years: 60.00   Smokeless tobacco: Never Used  Substance and Sexual Activity   Alcohol use: Yes    Frequency: Never    Comment: Social   Drug use: Yes    Types: Marijuana    Comment: Daily x50 years   Sexual activity: Not on file  Lifestyle   Physical activity    Days per week: Not on file    Minutes per session: Not on file   Stress: Not on file  Relationships   Social connections    Talks on phone: Not on file    Gets together: Not on file    Attends religious service: Not on file    Active member of club or organization: Not on file    Attends meetings of clubs or organizations: Not on file    Relationship status: Not on file   Intimate partner violence    Fear of current or ex partner: Not on file    Emotionally abused: Not on file    Physically abused: Not on file    Forced sexual activity: Not on file  Other Topics Concern   Not on file  Social History Narrative   Not on file    FAMILY HISTORY: Family History  Problem Relation Age of Onset   Prostate cancer Other     ALLERGIES:  is allergic to penicillins.  MEDICATIONS:  Current Outpatient Medications  Medication Sig Dispense Refill   Difluprednate (DUREZOL) 0.05 % EMUL Place 1 drop into the left eye daily.      docusate sodium (COLACE) 100 MG capsule Take 1 capsule (100 mg total) by mouth daily for 14 days. 14 capsule 0   feeding supplement, ENSURE ENLIVE, (ENSURE ENLIVE) LIQD Take 237 mLs by mouth 3 (three) times daily between meals. 237 mL 12   magnesium oxide (MAG-OX) 400 (241.3 Mg) MG tablet Take 1 tablet (400 mg total) by mouth 2 (two) times daily for 14 days. 28 tablet 0   Multiple Vitamin (MULTIVITAMIN WITH MINERALS) TABS tablet Take 1 tablet by mouth daily. 30 tablet 0  oxyCODONE (OXY IR/ROXICODONE) 5 MG immediate release tablet Take 1 tablet (5 mg total) by mouth every 4 (four) hours as needed for up to 5 days for moderate pain. 30 tablet 0   polyethylene glycol (MIRALAX / GLYCOLAX) 17 g packet Take 17 g by mouth daily. 14 each 0   tamsulosin (FLOMAX) 0.4 MG CAPS capsule Take 1 capsule (0.4 mg total) by mouth daily. 30 capsule 1   tuberculin (TUBERSOL) 5 UNIT/0.1ML injection Inject 5 Units into the skin once.     No current facility-administered medications for this visit.     REVIEW OF SYSTEMS:   Constitutional: Denies fevers, chills or abnormal night sweats (+) weight loss Eyes: Denies blurriness of vision, double vision or watery eyes Ears, nose, mouth, throat, and face: Denies mucositis or sore throat Respiratory: Denies wheezes (+) cough (+) dyspnea Cardiovascular: Denies palpitation, chest discomfort or lower extremity swelling Gastrointestinal:  Denies nausea, heartburn or change in bowel habits (+) colostomy Skin: Denies abnormal skin rashes (+) open midline abdominal incision (+) pressure sore to coccyx Lymphatics: Denies new lymphadenopathy or easy bruising Neurological:Denies numbness, tingling (+) generalized weakness Behavioral/Psych: Mood is stable, no new changes  All other systems were reviewed with the patient and are negative.  PHYSICAL EXAMINATION: ECOG PERFORMANCE STATUS: 2 - Symptomatic, <50% confined to bed  Vitals:   10/09/19 1526  BP: 104/65  Pulse: 97   Resp: 15  Temp: 98.3 F (36.8 C)  SpO2: 99%   Filed Weights   10/09/19 1526  Weight: 119 lb 11.2 oz (54.3 kg)    GENERAL:alert, no distress and comfortable SKIN: no obvious rash. Dressing to midline abdominal incision is c/d/i. Pressure ulcer to coccyx covered with dressing  EYES: sclera clear NECK: without mass LUNGS: clear with normal breathing effort, no wheezing HEART: regular rate & rhythm, no lower extremity edema ABDOMEN: abdomen soft, non-tender and normal bowel sounds. Left colostomy in place Musculoskeletal:no cyanosis of digits  PSYCH: alert & oriented x 3 with fluent speech NEURO: mild weakness, gets up to exam table with minimal assistance   LABORATORY DATA:  I have reviewed the data as listed CBC Latest Ref Rng & Units 10/04/2019 10/01/2019 09/30/2019  WBC 4.0 - 10.5 K/uL 6.3 6.5 7.4  Hemoglobin 13.0 - 17.0 g/dL 10.7(L) 10.5(L) 10.4(L)  Hematocrit 39.0 - 52.0 % 33.4(L) 32.3(L) 33.7(L)  Platelets 150 - 400 K/uL 349 275 279   CMP Latest Ref Rng & Units 10/04/2019 10/03/2019 09/30/2019  Glucose 70 - 99 mg/dL 101(H) 120(H) 127(H)  BUN 8 - 23 mg/dL 11 11 16   Creatinine 0.61 - 1.24 mg/dL 0.73 0.65 0.70  Sodium 135 - 145 mmol/L 133(L) 137 139  Potassium 3.5 - 5.1 mmol/L 4.0 4.1 4.0  Chloride 98 - 111 mmol/L 105 108 112(H)  CO2 22 - 32 mmol/L 22 23 23   Calcium 8.9 - 10.3 mg/dL 7.7(L) 7.7(L) 7.4(L)  Total Protein 6.5 - 8.1 g/dL - 4.9(L) 4.4(L)  Total Bilirubin 0.3 - 1.2 mg/dL - 0.5 0.4  Alkaline Phos 38 - 126 U/L - 101 63  AST 15 - 41 U/L - 43(H) 41  ALT 0 - 44 U/L - 29 21    RADIOGRAPHIC STUDIES: I have personally reviewed the radiological images as listed and agreed with the findings in the report. Ct Abdomen Pelvis Wo Contrast  Result Date: 09/22/2019 CLINICAL DATA:  78 year old male with free air on prior plain film EXAM: CT ABDOMEN AND PELVIS WITHOUT CONTRAST TECHNIQUE: Multidetector CT imaging of the abdomen and  pelvis was performed following the standard  protocol without IV contrast. COMPARISON:  Plain film 09/22/2019, CT 09/19/2019 FINDINGS: Lower chest: Atelectasis at the lung bases with respiratory motion somewhat limiting the lung evaluation. Gastric tube terminates at the GE junction. Hepatobiliary: The previous identified right liver mass is incompletely characterized on the current CT. Unremarkable gallbladder. No radiopaque cholelithiasis. Pancreas: Unremarkable Spleen: Unremarkable Adrenals/Urinary Tract: Unremarkable adrenal glands. Fullness in the bilateral collecting system with distention of the urinary. Bilateral cysts of the kidneys, likely Bosniak 1 cysts. Stomach/Bowel: Stomach is decompressed. Similar degree mild distention of small bowel loops. Similar degree of colonic distention with moderate to large stool burden. The irregular appearance of the distal rectum, potentially a rectal carcinoma and better characterized on prior CT, is similar in appearance. There is evidence of ongoing abscess in the perirectal region, with fluid and gas collection measuring transversely 7.9 cm. Evidence of free air in the left and right subdiaphragmatic region corresponding to findings on prior plain film. Vascular/Lymphatic: Mild atherosclerotic changes of the aorta and iliac arteries. Reproductive: Prostate measures 5.2 cm transverse diameter with impression on the bladder base. Other: Body wall edema and mesenteric edema/anasarca. Musculoskeletal: . no acute displaced fracture. Degenerative changes of the spine. IMPRESSION: CT demonstrates free air in the bilateral subdiaphragmatic region, compatible with hollow viscus perforation. The precise site of perforation is not identified on the current CT, however, the presumed diagnosis is a perforated and obstructing rectal colon carcinoma, of which this would be the most likely source of the perforation. Persisting evidence of colonic obstruction, with moderate to large stool burden. Metastatic disease to the liver  again demonstrated. Perirectal abscess measuring 7.9 cm at the site of the presumed rectal cancer. These results were discussed by telephone at the time of interpretation on 09/22/2019 at 3:14 pm with Dr. Georganna Skeans of surgery. Distention of urinary bladder, which is the presumed etiology of mild bilateral dilation of the collecting system. Body wall/mesenteric edema/anasarca. Additional ancillary findings as above. Electronically Signed   By: Corrie Mckusick D.O.   On: 09/22/2019 15:17   Dg Abd 1 View  Result Date: 09/23/2019 CLINICAL DATA:  Nasogastric tube advancement. Recent exploratory laparotomy with colostomy. EXAM: ABDOMEN - 1 VIEW COMPARISON:  One view abdomen 09/23/2019 and 09/21/2019. FINDINGS: 1309 hours. The enteric tube has been advanced with the tip at the level of the gastric fundus and the side hole near the GE junction. Mild diffuse bowel distention appears unchanged. There is mild atelectasis at both lung bases. IMPRESSION: The enteric tube has been advanced, tip now overlying the gastric fundus. Electronically Signed   By: Richardean Sale M.D.   On: 09/23/2019 13:34   X-ray Abdomen Ap  Result Date: 09/23/2019 CLINICAL DATA:  Reason for exam: S/P abdominal surgery, follow up exam RN informs there is questionable displacement of NG tube. S/P EXPLORATORY LAPAROTOMY EXAM: ABDOMEN - 1 VIEW COMPARISON:  Abdominal radiograph 09/21/2019 FINDINGS: The nasogastric tube side port tip projects over the mid esophagus. Diffuse gaseous distention of bowel loops similar to prior exams. Lung bases are clear. No acute finding in the visualized skeleton. IMPRESSION: Nasogastric tube tip projects over the mid esophagus and could be advanced approximately 13 cm to reach the stomach. Electronically Signed   By: Audie Pinto M.D.   On: 09/23/2019 12:22   Ct Chest Wo Contrast  Result Date: 09/20/2019 CLINICAL DATA:  Staging for colon carcinoma. EXAM: CT CHEST WITHOUT CONTRAST TECHNIQUE: Multidetector  CT imaging of the chest was performed following  the standard protocol without IV contrast. COMPARISON:  MRI of the abdomen, 09/20/2019. CT of the abdomen pelvis, 09/19/2019. FINDINGS: Cardiovascular: Heart is normal in size and configuration. No pericardial effusion. No coronary artery calcifications. Great vessels are normal in caliber. Minor aortic atherosclerotic calcifications. Mediastinum/Nodes: There is a lobulated masses extends from the AP window region of the mediastinum into the left upper lobe. It measures 6.1 x 4.9 cm transversely at the level of the AP window. It extends for a length of 8.5 cm obliquely from inferomedial to anterolateral. It abuts the left anteromedial pleural margin, which may be invaded. 1.6 cm predominantly hypoattenuating posterior left thyroid lobe nodule. No neck base or axillary masses or enlarged lymph nodes. No other mediastinal masses.  No mediastinal or hilar adenopathy. Trachea and esophagus are unremarkable. Lungs/Pleura: Linear/reticular opacities extend from the left upper lobe mass to the anterior, anterolateral and anteromedial pleural margins. There are no other lung masses. No nodules. Linear opacities are noted in the right lower lobe consistent with atelectasis. Mild centrilobular emphysema. No evidence of pneumonia or pulmonary edema. No pleural effusion or pneumothorax. Upper Abdomen: Poorly defined mass in the right lobe of the liver better seen on the previous day's study. Distention of the visualized bowel prominently with gas. No acute findings. Musculoskeletal: Degenerative changes of the visualized lower cervical spine. No fracture or acute finding. No osteoblastic or osteolytic lesions. IMPRESSION: 1. Mass extends from the AP window of the mediastinum into the left upper lobe consistent with neoplastic disease, which could be primary or metastatic. 2. There are no other lung masses or nodules to support metastatic disease. 3. No acute findings. 4.  Incidental finding a 1.6 cm thyroid nodule on the left. Depending on the outcome of the patient's other findings on this chest CT and prior abdomen and pelvis CT, further evaluation with thyroid ultrasound. If patient is clinically hyperthyroid, consider nuclear medicine thyroid uptake and scan. 5. Mild centrilobular emphysema.  Mild aortic atherosclerosis. Aortic Atherosclerosis (ICD10-I70.0) and Emphysema (ICD10-J43.9). Electronically Signed   By: Lajean Manes M.D.   On: 09/20/2019 13:55   Ct Abdomen Pelvis W Contrast  Result Date: 09/19/2019 CLINICAL DATA:  78 year old male with abdominal pain and swelling. EXAM: CT ABDOMEN AND PELVIS WITH CONTRAST TECHNIQUE: Multidetector CT imaging of the abdomen and pelvis was performed using the standard protocol following bolus administration of intravenous contrast. CONTRAST:  154mL OMNIPAQUE IOHEXOL 300 MG/ML  SOLN COMPARISON:  Abdominal radiograph dated 09/19/2019 and CT of the abdomen pelvis dated 05/17/2013 FINDINGS: Lower chest: The visualized lung bases are clear. There is mild emphysema. No intra-abdominal free air. No free fluid. Hepatobiliary: There is a 7.5 x 7.0 cm heterogeneously enhancing mass in the right lobe of the liver (segment VII). This mass is suboptimally characterized but concerning for a malignancy such as hepatocellular carcinoma. Further evaluation with MRI without and with contrast a nonemergent basis recommended. No intrahepatic biliary ductal dilatation. The gallbladder is unremarkable. Pancreas: Pancreas is grossly unremarkable as visualized. Spleen: Normal in size without focal abnormality. Adrenals/Urinary Tract: The adrenal glands are unremarkable. There is a 5 mm right renal inferior pole nonobstructing stone. There is no hydronephrosis on either side. Bilateral renal cysts measure up to 2.5 cm in the interpolar aspect of the right kidney. There is a punctate nonobstructing left renal upper pole calculus. There is symmetric enhancement  and excretion of contrast by both kidneys. There is diffuse thickened appearance of bladder wall which may be partly related to underdistention or represent cystitis.  An infiltrative process is not excluded. Stomach/Bowel: There is segmental thickening of the sigmoid colon. There is a 7.3 x 4.5 cm collection containing debris and air within the pelvis posterior to the urinary bladder and anterior to the rectosigmoid. There is mild diffuse inflammatory changes of the pelvis and perirectal fat. This complex collection is not well characterized but may represent an abscess, and inflamed large diverticula, or a malignancy with a large ulceration. There is distention of the colon with air and stool proximal to the thickened segment of the sigmoid colon. The cecum is located in the mid abdomen. No evidence of twisting or volvulus. There is no definite evidence of small-bowel obstruction. The appendix is normal. Vascular/Lymphatic: There is moderate aortoiliac atherosclerotic disease. The IVC is unremarkable. No portal venous gas. There is no adenopathy. Reproductive: Mildly enlarged prostate gland with multiple fiducial markers. Prostate measures approximately 5.2 cm in transverse axial diameter. The seminal vesicles are symmetric. Other: There is loss of subcutaneous and mesenteric fat and cachexia. Musculoskeletal: No acute or significant osseous findings. IMPRESSION: 1. Segmental thickening of the sigmoid colon with adjacent inflammatory changes. A 7.3 x 4.5 cm complex collection containing debris and air may represent an abscess or a large malignant ulceration. CT of the pelvis with rectal contrast may provide better evaluation. The thickened appearance of the sigmoid colon may be infectious or inflammatory in etiology or represent infiltrative neoplasm. Further evaluation with sigmoidoscopy following resolution of acute inflammatory changes recommended. 2. Diffuse distention of the colon proximal to the thickened  segment of sigmoid. No valvular. No evidence of small-bowel obstruction. Normal appendix. 3. A 7.5 x 7.0 cm heterogeneously enhancing mass in the right lobe of the liver most consistent with malignancy such as hepatocellular carcinoma. Further evaluation with MRI without and with contrast a nonemergent basis recommended. 4. Aortic Atherosclerosis (ICD10-I70.0). 5. Cachexia. Electronically Signed   By: Anner Crete M.D.   On: 09/19/2019 21:15   Mr Liver W Wo Contrast  Addendum Date: 09/20/2019   ADDENDUM REPORT: 09/20/2019 08:31 ADDENDUM: These results were called by telephone at the time of interpretation on 09/20/2019 at 8:30 am to provider Dr. Horris Latino , who verbally acknowledged these results. Low T2 signal likely relates to fibrotic changes within this lesion. Electronically Signed   By: Zetta Bills M.D.   On: 09/20/2019 08:31   Result Date: 09/20/2019 CLINICAL DATA:  History hepatic malignancy presenting to the emergency department generalized abdominal pain, worsening past month. EXAM: MRI ABDOMEN WITHOUT AND WITH CONTRAST TECHNIQUE: Multiplanar multisequence MR imaging of the abdomen was performed both before and after the administration of intravenous contrast. CONTRAST:  31mL GADAVIST GADOBUTROL 1 MMOL/ML IV SOLN COMPARISON:  CT evaluation 09/20/2019. FINDINGS: Lower chest: No signs of pleural effusion or definite pulmonary abnormality. Hepatobiliary: 7.9 x 7.5 7.4 cm mass in the posterior right hepatic lobe is poorly enhancing, mainly in the periphery. Lesion displays mixed T2 signal. No signs chronic liver disease portal hypertension. Cholelithiasis. No biliary ductal dilation. Pancreas: No mass, inflammatory changes, or other parenchymal abnormality identified. Spleen:  Within normal limits in size and appearance. Adrenals/Urinary Tract: No masses identified. No evidence of hydronephrosis. Bilateral renal cysts. Stomach/Bowel: Bowel distention is similar to the previous exam. The sigmoid colon  not assessed on the current examination. Vascular/Lymphatic: Patent abdominal vasculature. Retroaortic left renal vein. No adenopathy. Other:  None. Musculoskeletal: No signs acute musculoskeletal finding. IMPRESSION: Large right hepatic mass lesion which is poorly enhancing. Constellation of findings favors metastatic lesion from obstructing colon cancer  leading to contained perforation adjacent to the sigmoid colon. Primary hepatic neoplasm is felt less likely. No signs of vascular invasion or adenopathy. The presence of susceptibility in the setting of bowel distension limits assessment. Electronically Signed: By: Zetta Bills M.D. On: 09/20/2019 07:59   US Biopsy (liver)  Result Date: 09/30/2019 INDICATION: 78 year old with a recent perforated bowel and concern for colorectal cancer. History of prostate cancer. Patient has a large liver lesion and need tissue diagnosis. EXAM: ULTRASOUND-GUIDED LIVER LESION BIOPSY MEDICATIONS: None. ANESTHESIA/SEDATION: Moderate (conscious) sedation was employed during this procedure. A total of Versed 1.0 mg and Fentanyl 50 mcg was administered intravenously. Moderate Sedation Time: 21 minutes. The patient's level of consciousness and vital signs were monitored continuously by radiology nursing throughout the procedure under my direct supervision. FLUOROSCOPY TIME:  None COMPLICATIONS: None immediate. PROCEDURE: Informed written consent was obtained from the patient after a thorough discussion of the procedural risks, benefits and alternatives. All questions were addressed. Maximal Sterile Barrier Technique was utilized including caps, mask, sterile gowns, sterile gloves, sterile drape, hand hygiene and skin antiseptic. A timeout was performed prior to the initiation of the procedure. Liver was evaluated with ultrasound. The hepatic lesion was identified. The right side of the abdomen was prepped with chlorhexidine and sterile field was created. Skin and soft tissues were  anesthetized with 1% lidocaine. 57 gauge coaxial needle was directed into the liver lesion with real-time ultrasound guidance. Three core biopsies were obtained with an 18 gauge core device. Gel-Foam slurry was injected along the needle track following the biopsies. Bandage placed over the puncture site. FINDINGS: Trace perihepatic ascites near the hepatic dome. Not enough fluid for a paracentesis. Large hyperechoic liver mass in the right hepatic lobe. Needle position confirmed within the liver lesion. Adequate core biopsies obtained. No significant bleeding or hematoma following the core biopsies. IMPRESSION: Ultrasound-guided core biopsies of the hepatic mass. Electronically Signed   By: Markus Daft M.D.   On: 09/30/2019 13:16   Dg Abd 2 Views  Result Date: 09/27/2019 CLINICAL DATA:  Follow-up bowel obstruction. EXAM: ABDOMEN - 2 VIEW COMPARISON:  Radiograph of September 25, 2019. FINDINGS: Nasogastric tube tip is seen in proximal stomach. Stable diffuse gaseous dilatation of large and small bowel is noted. Surgical drain is noted in the pelvis. Phleboliths are noted in the pelvis. No definite pneumoperitoneum is noted. IMPRESSION: Grossly stable large and small bowel dilatation is noted concerning for ileus or less likely obstruction. Electronically Signed   By: Marijo Conception M.D.   On: 09/27/2019 11:31   Dg Abd 2 Views  Addendum Date: 09/22/2019   ADDENDUM REPORT: 09/22/2019 12:09 ADDENDUM: Above findings discussed with Dr. Johnathan Hausen at 11:34 a.m. Electronically Signed   By: Marin Olp M.D.   On: 09/22/2019 12:09   Result Date: 09/22/2019 CLINICAL DATA:  Colonic obstruction. EXAM: ABDOMEN - 2 VIEW COMPARISON:  09/21/2019 FINDINGS: Nasogastric tube has been pulled back as tip is located in the expected region of the distal esophagus just above the gastroesophageal junction. This could be advanced 8-10 cm. Continued air-filled loops of large and small bowel with moderate fecal retention over the  right colon and distal descending colon. Collection of air under the right hemidiaphragm on the upright film which may represent free peritoneal air. No definite dilated small bowel loops. Persistent mildly distended bladder. Remainder the exam is unchanged. IMPRESSION: Persistent multiple air-filled loops of large and small bowel with moderate fecal retention over the right and left colon. No  definite dilated small bowel loops. Possible free air under the right hemidiaphragm. Right lateral decubitus film versus CT could confirm this finding. Nasogastric tube with tip over the region of the distal esophagus just above the gastroesophageal junction. This could be advanced 8-10 cm. Electronically Signed: By: Marin Olp M.D. On: 09/22/2019 11:25   Dg Abd 2 Views  Result Date: 09/19/2019 CLINICAL DATA:  Distended abdomen EXAM: ABDOMEN - 2 VIEW COMPARISON:  None. FINDINGS: Markedly distended colon extending to the rectum. Mild amount of small bowel gas. No free air. No abnormal calcifications or skeletal lesion. IMPRESSION: Markedly distended colon to the rectum. Findings most compatible with colonic ileus although rectal obstruction possible. Electronically Signed   By: Franchot Gallo M.D.   On: 09/19/2019 16:01   Dg Abd Portable 1v  Result Date: 09/25/2019 CLINICAL DATA:  Acute onset abdominal distension today. EXAM: PORTABLE ABDOMEN - 1 VIEW COMPARISON:  Single-view of the abdomen 09/23/2019 and 09/22/2019. FINDINGS: NG tube remains in place in good position. Diffuse gaseous distention of small and large bowel persists. Large colonic stool burden is again seen. Tube projecting over the pelvis with its tip over the left ilium is consistent with a surgical drain. IMPRESSION: Diffuse gaseous distention of small and large bowel compatible with ileus. Large colonic stool burden noted. Electronically Signed   By: Inge Rise M.D.   On: 09/25/2019 11:02   Dg Abd Portable 1v  Result Date:  09/21/2019 CLINICAL DATA:  NG tube placement EXAM: PORTABLE ABDOMEN - 1 VIEW COMPARISON:  09/19/2019 FINDINGS: The enteric tube projects over the expected region of the gastric body. The tube is kinked at the side-hole. Again noted is extensive gaseous distention of loops of small bowel and colon scattered throughout the abdomen. There is no definite pneumatosis or free air, however evaluation for both is limited by the diffuse gaseous distension. The urinary bladder is significantly distended. IMPRESSION: 1. Enteric tube as above. 2. Persistent gaseous distension of small bowel and colon. 3. Distended urinary bladder. Electronically Signed   By: Constance Holster M.D.   On: 09/21/2019 13:20   [place] Ekg Site Rite  Result Date: 09/25/2019 If Site Rite image not attached, placement could not be confirmed due to current cardiac rhythm.   ASSESSMENT & PLAN: 78 yo male with h/o prostate cancer s/p radiation in 2015 presented with anorexia, weight loss, abdominal pain and distention   1. 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 He presented with anorexia, weight loss, and abdominal pain; he was found to have a perforated rectal cancer invading the bladder. Liver biopsy confirmed metastatic adenocarcinoma. On work up he was found to have a left lung mass concerning for metastasis vs primary lung cancer. He has a 60 pack year smoking history -Unfortunately his rectal cancer is stage IV and not resectable, therefore not curable.  -Due to his age, limited PS and delayed recovery from recent hospitalization and poor nutrition, he is not an ideal candidate for chemotherapy. He reports being in good health before presenting to ED in 09/2019 -Will request FO testing on liver biopsy to see if he is a candidate for immunotherapy or targeted therapy -we discussed his lung mass is worrisome for cancer, he is  agreeable to work up with possible bronchoscopy for tissue diagnosis. Will refer to Dr. Valeta Harms.  -He resides at [facility] rehab, insurance has approved 21 days, currently  on day 4. He gets daily PT/OT and is able to ambulate by himself for the most part.  -He has open abdominal wound and a pressure sore on his coccyx, we reviewed he would need to heal more, gain weight, and improve his nutrition before we consider treatment. He seems motivated to recover -due to significant weight loss, will refer him to dietician. He drinks Ensure TID and reports eating a normal diet -Plan to f/u in 3 weeks to review work up and discuss treatment options   2. Anorexia, weight loss -he lost nearly 30 lbs, usual weight is 145-150 lbs Secondary to malignancy -malnutrition is also contributing to his pressure sore  -drinks Ensure TID, reports a normal diet -I placed an urgent referral to dietician  3. Urinary retention with indwelling foley catheter -previously followed by [facility]. Lowella Bandy at [facility] for prostate cancer -will refer him back to [facility] to see who can follow him now -he seems motivated to learn foley care on his own  4. Goals of care -Mr. Ostrow and his wife understand this is stage IV, incurable cancer, but it is treatable if his PS and nutrition improve so that he can tolerate treatment -He has strong faith in God but is also interested in medical therapy if he is a candidate  -Full code   5. 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 Arloa Koh   6. Social issues  -He lives with his spouse when not at SNF, has 3 children -His wife is dealing with recent hip replacement and her own health issues, she was not aware to come to the appointment today but was available on the phone -She was strongly encouraged to come to his next appt in 3 weeks   PLAN: -Medical record reviewed -Request FO testing on liver biopsy   -Referrals to dietician, pulm Dr. Valeta Harms, and urology  -Possible bronchoscopy for tissue diagnosis  -Lab, f/u in 3 weeks to review work up and discuss treatment    Orders Placed This Encounter  Procedures   Ambulatory referral to Nutrition and Diabetic E    Referral Priority:   Urgent    Referral Type:   Consultation    Referral Reason:   Specialty Services Required    Number of Visits Requested:   1   Ambulatory referral to Urology    Referral Priority:   Routine    Referral Type:   Consultation    Referral Reason:   Specialty Services Required    Requested Specialty:   Urology    Number of Visits Requested:   1   Ambulatory referral to Pulmonology    Referral Priority:   Urgent    Referral Type:   Consultation    Referral Reason:   Specialty Services Required    Referred to Provider:   Garner Nash, DO    Requested Specialty:   Pulmonary Disease    Number of Visits Requested:   1    All questions were answered. The patient knows to call the clinic with any problems, questions or concerns.     Alla Feeling, NP 10/09/2019 5:28 PM  Addendum  I have seen the patient, examined him. I agree with the assessment and and plan and have edited the notes.   I have reviewed his recent images in person and discussed with pt and his wife (over the phone) in detail.  Unfortunately he has locally advanced  rectal cancer, unresectable, complicated with bowel obstruction and perforation, and a large biopsy confirmed liver metastasis from colon cancer.  His CTs chest is also concerning for primary left upper lobe lung cancer with possible hilar and mediastinal node metastasis, vs colon cancer metastasis. He is currently in SNF for rehab.  I reviewed that this is incurable colon cancer, and he likely has secondary primary lung cancer.  Patient however does not seem to understand this well, and believes "I will heal myself with God's help", his wife is much more realistic, and voiced good  understanding.  Patient was previously healthy and had good PS before he presented to hospital. He is open to further work up and treatment we recommend. Due to his advanced age, intensive chemo will be challenge fo rhim to take, will order FO on his liver biopsy to see if he is a candidate for immunotherapy and or targeted therapy.  Will refer him to pulmonary Dr. Valeta Harms for bronchoscopy and a biopsy. He still has open surgical wound and sacral pressure ulcer, I encouraged him to eat well and continue physical therapy to improve his overall condition and PS, will see him back in 3 weeks for re-evaluation.   Truitt Merle  10/10/2019

## 2019-10-09 NOTE — Telephone Encounter (Signed)
Called and spoke with Mr. Detoro to introduce myself and to explain role of GI Nurse Navigator. Mrs. Dudziak will accompany patient to appointment this afternoon. Advised to arrive twenty minutes early to appointment. Scheduling message added to allow Mrs. Balius to accompany patient to clinic. I will follow up with patient as needed after initial consult.

## 2019-10-10 ENCOUNTER — Telehealth: Payer: Self-pay | Admitting: Nurse Practitioner

## 2019-10-10 ENCOUNTER — Telehealth: Payer: Self-pay

## 2019-10-10 NOTE — Telephone Encounter (Signed)
Patient's wife said that someone called there house and didn't leave a message. I spoke to her and told her I am assuming it is regarding his appointment with the nutritionist here at the [facility] on Friday 10/23 at 12:00. She wondered if they let [facility] know for transportation.  She will call them and let them know.

## 2019-10-10 NOTE — Telephone Encounter (Signed)
-----  Message from Garner Nash, DO sent at 10/10/2019  9:01 AM EDT ----- Regarding: FW: Marye Round,  Please schedule with me for next available consult slot. Put him in for next Wednesday/Thursday if you can.   Thanks  Leory Plowman ----- Message ----- From: Truitt Merle, MD Sent: 10/10/2019   8:37 AM EDT To: Coralie Keens, MD, Arna Snipe, RN, #  Brooke Pace,  We reviewed his case in our GI tumor board, his left upper lobe lung lesion which extends to hilar and AP window is suspicious for primary lung cancer. His liver biopsy confirmed colon cancer metastasis.   He is an interesting characteristic, believes "I will heal myself with God's help", but is open to workup and treatment we offer. His wife is much more realistic, please have her in the conversation if you can. He is 78 yo, but has been healthy and functioning well at home until he presented to hospital with colon perforation.   Please see him when you are available, not urgent. Thanks much.  Dr. Ninfa Linden, thanks for your referral.    Krista Blue  ----- Message ----- From: Alla Feeling, NP Sent: 10/09/2019   5:29 PM EDT To: Jonnie Finner, RN, Karie Mainland, RD, #  Hi Team, Dr. Burr Medico and I met [name] today:   Dr. Valeta Harms, this is an unfortunate guy who presented with perforated rectal cancer with liver metastasis. He has a left lung mass concerning for metastasis vs lung primary. He currently lives in SNF rehab, smoker, etc. He is agreeable to discuss bronchoscopy for tissue diagnosis if you feel he is a candidate. I have referred him to you.   Barb, I placed an urgent nutrition referral for nearly 30 lbs weight loss. He currently drinks Ensure TID.   [facility] Mood or [employer],  He will be in SNF for a couple more weeks. His wife had a hip replacement and is dealing with her own health issues currently. He is a good candidate for home health after he gets home. Please refer him for nursing care (indwelling foley and wound care for  abdominal wound and pressure sore) and PT.   We plan to see him back in 3 weeks.  Thanks all, Regan Rakers

## 2019-10-10 NOTE — Telephone Encounter (Signed)
Scheduled appt per 10/21 los.  Spoke with pt and he is aware of his appt date and time.  Patient stated he need transportation.  He will be getting enrolled with transportation today.

## 2019-10-10 NOTE — Telephone Encounter (Signed)
Pt is at [facility] and had to leave message w/scheduler to schedule appt.  Needs appt with Dr. Valeta Harms 10/28 or 10/29.

## 2019-10-11 ENCOUNTER — Other Ambulatory Visit: Payer: Self-pay

## 2019-10-11 ENCOUNTER — Telehealth: Payer: Self-pay

## 2019-10-11 ENCOUNTER — Inpatient Hospital Stay: Payer: Medicare Other | Admitting: Nutrition

## 2019-10-11 DIAGNOSIS — C787 Secondary malignant neoplasm of liver and intrahepatic bile duct: Secondary | ICD-10-CM

## 2019-10-11 DIAGNOSIS — C20 Malignant neoplasm of rectum: Secondary | ICD-10-CM

## 2019-10-11 NOTE — Progress Notes (Signed)
78 year old male diagnosed with rectal cancer status post colostomy.  Patient is currently at [facility] and has noted abdominal wound as well as a pressure sore on his coccyx.  Patient will need to increase his nutrition status before treatment for cancer could be initiated.  Past medical history includes prostate cancer status post radiation treatments, COPD, hyperlipidemia, and vitamin D deficiency.  Medications include Colace, magnesium oxide, multivitamin, and MiraLAX.  Labs include sodium 133, glucose 101, and albumin 1.7 on October 16.  Height: 5 feet 8 inches. Weight: 119 pounds on October 21. Usual body weight: 155 pounds September 23, 2019. BMI: 18.2.  I met with patient and wife.  Patient remains a patient at a SNF. He reports he eats well and enjoys the food.  He eats 3 meals daily as well as Ensure Enlive twice a day. He reports that his staff have told him his wounds are healing well. He denies nutrition impact symptoms at this time. He does not think he is taking a multivitamin at this time.  Nutrition diagnosis: Increased nutrient needs related to new diagnosis of rectal cancer and associated treatments as evidenced by 23% weight loss and increased needs for healing.  Intervention: Patient was educated on high-calorie, high-protein foods. Encouraged him to consume specific foods at breakfast, lunch and dinner. Recommended he ask to increase Ensure Enlive 3 times daily between meals. Recommend patient begin a multivitamin, 500 mg of vitamin C twice daily and 220 mg zinc to promote healing. I have left a message with Isaias Cowman regarding nutrition recommendations. Provided fact sheets.  Questions were answered.  Teach back method used.  Contact information given.  Monitoring, evaluation, goals: Patient will tolerate increased calories, protein and nutrients to promote healing.  Next visit: To be scheduled as needed.  **Disclaimer: This note was dictated with voice  recognition software. Similar sounding words can inadvertently be transcribed and this note may contain transcription errors which may not have been corrected upon publication of note.**

## 2019-10-11 NOTE — Telephone Encounter (Signed)
Faxed referral to [facility] with demographics and OV note from 10/09/2019 requesting SN and PT services start upon patient's discharge from [facility].  This was faxed to [phone], received confirmation fax went through.

## 2019-10-15 ENCOUNTER — Emergency Department
Admission: EM | Admit: 2019-10-15 | Discharge: 2019-10-16 | Disposition: A | Discharge status: Other Acute Inpatient Hospital | Payer: Medicare Other | Attending: Emergency Medicine | Admitting: Emergency Medicine

## 2019-10-15 ENCOUNTER — Emergency Department: Payer: Medicare Other

## 2019-10-15 ENCOUNTER — Encounter: Payer: Self-pay | Admitting: Emergency Medicine

## 2019-10-15 ENCOUNTER — Other Ambulatory Visit: Payer: Self-pay

## 2019-10-15 DIAGNOSIS — A419 Sepsis, unspecified organism: Secondary | ICD-10-CM | POA: Diagnosis not present

## 2019-10-15 DIAGNOSIS — R4182 Altered mental status, unspecified: Secondary | ICD-10-CM | POA: Diagnosis not present

## 2019-10-15 DIAGNOSIS — R6521 Severe sepsis with septic shock: Secondary | ICD-10-CM | POA: Diagnosis not present

## 2019-10-15 DIAGNOSIS — R509 Fever, unspecified: Secondary | ICD-10-CM | POA: Diagnosis present

## 2019-10-15 DIAGNOSIS — F17200 Nicotine dependence, unspecified, uncomplicated: Secondary | ICD-10-CM | POA: Insufficient documentation

## 2019-10-15 DIAGNOSIS — Z20828 Contact with and (suspected) exposure to other viral communicable diseases: Secondary | ICD-10-CM | POA: Insufficient documentation

## 2019-10-15 DIAGNOSIS — C20 Malignant neoplasm of rectum: Secondary | ICD-10-CM | POA: Diagnosis not present

## 2019-10-15 DIAGNOSIS — Z933 Colostomy status: Secondary | ICD-10-CM | POA: Diagnosis not present

## 2019-10-15 DIAGNOSIS — Z79899 Other long term (current) drug therapy: Secondary | ICD-10-CM | POA: Insufficient documentation

## 2019-10-15 DIAGNOSIS — J449 Chronic obstructive pulmonary disease, unspecified: Secondary | ICD-10-CM | POA: Diagnosis not present

## 2019-10-15 LAB — LACTIC ACID, PLASMA: Lactic Acid, Venous: 1.4 mmol/L (ref 0.5–1.9)

## 2019-10-15 LAB — CBC WITH DIFFERENTIAL/PLATELET
Abs Immature Granulocytes: 0.07 10*3/uL (ref 0.00–0.07)
Basophils Absolute: 0 10*3/uL (ref 0.0–0.1)
Basophils Relative: 0 %
Eosinophils Absolute: 0 10*3/uL (ref 0.0–0.5)
Eosinophils Relative: 0 %
HCT: 30 % — ABNORMAL LOW (ref 39.0–52.0)
Hemoglobin: 9.6 g/dL — ABNORMAL LOW (ref 13.0–17.0)
Immature Granulocytes: 1 %
Lymphocytes Relative: 6 %
Lymphs Abs: 0.7 10*3/uL (ref 0.7–4.0)
MCH: 30.3 pg (ref 26.0–34.0)
MCHC: 32 g/dL (ref 30.0–36.0)
MCV: 94.6 fL (ref 80.0–100.0)
Monocytes Absolute: 1 10*3/uL (ref 0.1–1.0)
Monocytes Relative: 8 %
Neutro Abs: 10.2 10*3/uL — ABNORMAL HIGH (ref 1.7–7.7)
Neutrophils Relative %: 85 %
Platelets: 273 10*3/uL (ref 150–400)
RBC: 3.17 MIL/uL — ABNORMAL LOW (ref 4.22–5.81)
RDW: 14.7 % (ref 11.5–15.5)
WBC: 12 10*3/uL — ABNORMAL HIGH (ref 4.0–10.5)
nRBC: 0 % (ref 0.0–0.2)

## 2019-10-15 LAB — COMPREHENSIVE METABOLIC PANEL
ALT: 19 U/L (ref 0–44)
AST: 36 U/L (ref 15–41)
Albumin: 1.9 g/dL — ABNORMAL LOW (ref 3.5–5.0)
Alkaline Phosphatase: 119 U/L (ref 38–126)
Anion gap: 10 (ref 5–15)
BUN: 18 mg/dL (ref 8–23)
CO2: 27 mmol/L (ref 22–32)
Calcium: 8 mg/dL — ABNORMAL LOW (ref 8.9–10.3)
Chloride: 103 mmol/L (ref 98–111)
Creatinine, Ser: 1.28 mg/dL — ABNORMAL HIGH (ref 0.61–1.24)
GFR calc Af Amer: >60 mL/min (ref >60)
GFR calc non Af Amer: 53 mL/min — ABNORMAL LOW (ref >60)
Glucose, Bld: 133 mg/dL — ABNORMAL HIGH (ref 70–99)
Potassium: 3.2 mmol/L — ABNORMAL LOW (ref 3.5–5.1)
Sodium: 140 mmol/L (ref 135–145)
Total Bilirubin: 0.4 mg/dL (ref 0.3–1.2)
Total Protein: 6 g/dL — ABNORMAL LOW (ref 6.5–8.1)

## 2019-10-15 LAB — URINALYSIS, COMPLETE (UACMP) WITH MICROSCOPIC
Bilirubin Urine: NEGATIVE
Glucose, UA: NEGATIVE mg/dL
Ketones, ur: NEGATIVE mg/dL
Nitrite: NEGATIVE
Protein, ur: 30 mg/dL — AB
RBC / HPF: >50 RBC/hpf — ABNORMAL HIGH (ref 0–5)
Specific Gravity, Urine: 1.013 (ref 1.005–1.030)
Squamous Epithelial / HPF: NONE SEEN (ref 0–5)
pH: 5 (ref 5.0–8.0)

## 2019-10-15 LAB — SARS CORONAVIRUS 2 BY RT PCR (HOSPITAL ORDER, PERFORMED IN ~~LOC~~ HOSPITAL LAB): SARS Coronavirus 2: NEGATIVE

## 2019-10-15 MED ORDER — SODIUM CHLORIDE 0.9 % IV BOLUS
1000.0000 mL | Freq: Once | INTRAVENOUS | Status: AC
  Administered 2019-10-15: 1000 mL via INTRAVENOUS

## 2019-10-15 MED ORDER — IOHEXOL 350 MG/ML SOLN
75.0000 mL | Freq: Once | INTRAVENOUS | Status: AC | PRN
  Administered 2019-10-15: 75 mL via INTRAVENOUS

## 2019-10-15 MED ORDER — IOHEXOL 300 MG/ML  SOLN: 75.0000 mL | Freq: Once | INTRAMUSCULAR | Status: DC | PRN

## 2019-10-15 MED ORDER — METRONIDAZOLE IN NACL 5-0.79 MG/ML-% IV SOLN
500.0000 mg | Freq: Once | INTRAVENOUS | Status: AC
  Administered 2019-10-15: 500 mg via INTRAVENOUS
  Filled 2019-10-15: qty 100

## 2019-10-15 MED ORDER — SODIUM CHLORIDE 0.9 % IV SOLN
2.0000 g | Freq: Once | INTRAVENOUS | Status: AC
  Administered 2019-10-15: 2 g via INTRAVENOUS
  Filled 2019-10-15: qty 2

## 2019-10-15 MED ORDER — VANCOMYCIN HCL 500 MG IV SOLR
500.0000 mg | Freq: Once | INTRAVENOUS | Status: AC
  Administered 2019-10-15: 500 mg via INTRAVENOUS
  Filled 2019-10-15: qty 500

## 2019-10-15 MED ORDER — SODIUM CHLORIDE 0.9 % IV BOLUS
500.0000 mL | Freq: Once | INTRAVENOUS | Status: AC
  Administered 2019-10-15: 500 mL via INTRAVENOUS

## 2019-10-15 MED ORDER — VANCOMYCIN HCL IN DEXTROSE 1-5 GM/200ML-% IV SOLN
1000.0000 mg | Freq: Once | INTRAVENOUS | Status: AC
  Administered 2019-10-15: 1000 mg via INTRAVENOUS
  Filled 2019-10-15: qty 200

## 2019-10-15 MED ORDER — ACETAMINOPHEN 500 MG PO TABS
1000.0000 mg | ORAL_TABLET | Freq: Once | ORAL | Status: AC
  Administered 2019-10-15: 1000 mg via ORAL
  Filled 2019-10-15: qty 2

## 2019-10-15 NOTE — Progress Notes (Signed)
CODE SEPSIS - PHARMACY COMMUNICATION  **Broad Spectrum Antibiotics should be administered within 1 hour of Sepsis diagnosis**  Time Code Sepsis Called/Page Received: 1713  Antibiotics Ordered: Flagyl,Vancomycin,Cefepime  Time of 1st antibiotic administration: 1727  Additional action taken by pharmacy: none  If necessary, Name of Provider/Nurse Contacted: N/A    Pearla Dubonnet ,PharmD Clinical Pharmacist  10/15/2019  6:37 PM

## 2019-10-15 NOTE — ED Provider Notes (Signed)
[facility] Emergency Department Provider Note  ____________________________________________   First MD Initiated Contact with Patient 10/15/19 1652     (approximate)  I have reviewed the triage vital signs and the nursing notes.   HISTORY  Chief Complaint Catheter issue.   HPI Charles Espinoza is a 78 y.o. male with COPD, prostate cancer who presents from [facility].  Patient initially came in for catheter displacement the patient was found to be febrile and slightly hypotensive on their arrival.  Patient initially temperature was 100.7.  Here patient's temperature is 99.9 and patient's blood pressures were 90s over 60s.  Patient says he pulled out his catheter yesterday because he had a spasm in his arm.  He denied having a lot of pain with it.  Came here to have his catheter replaced but then they noted these abnormal vital signs.  He denies any cough, shortness of breath, abdominal pain.  Patient has some mild penile pain after pulling out the catheter that was moderate, constant, better on its own, onset yesterday.   On review of records patient was just discharged on 10/16 with CT showing volvulus and complex sigmoid collection.  Patient underwent explorative laparotomy with diverting colostomy.          Past Medical History:  Diagnosis Date  . COPD (chronic obstructive pulmonary disease) ([place])   . Elevated PSA   . Hx of radiation therapy 09/25/13- 11/27/13   prostate 7800 cGy 39 sessions  . Hyperlipidemia   . Prostate cancer ([employer]) 02/27/13   gleason 6, volume 133 cc  . Prostatitis   . Vitamin D deficiency     Patient Active Problem List   Diagnosis Date Noted  . 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 Pressure injury of skin 10/04/2019  . Colon obstruction ([place])   . Palliative care encounter   . Protein-calorie malnutrition, severe 09/26/2019  . Abdominal distension   . Liver mass 09/19/2019  . Colonic mass  09/19/2019  . Mass of colon 09/19/2019  . Malignant neoplasm of prostate ([place]) 08/15/2013    Past Surgical History:  Procedure Laterality Date  . COLOSTOMY  09/23/2019   Procedure: DIVERTING LOOP DESCENDING COLOSTOMY;  Surgeon: Coralie Keens, MD;  Location: [place];  Service: General;;  . LAPAROTOMY N/A 09/23/2019   Procedure: EXPLORATORY LAPAROTOMY;  Surgeon: Coralie Keens, MD;  Location: [place];  Service: General;  Laterality: N/A;  . PROSTATE BIOPSY  02/27/2013  . TONSILLECTOMY      Prior to Admission medications   Medication Sig Start Date End Date Taking? Authorizing Provider  Difluprednate (DUREZOL) 0.05 % EMUL Place 1 drop into the left eye daily.    [provider]  docusate sodium (COLACE) 100 MG capsule Take 1 capsule (100 mg total) by mouth daily for 14 days. 10/06/19 10/20/19  Cherylann Ratel A, DO  feeding supplement, ENSURE ENLIVE, (ENSURE ENLIVE) LIQD Take 237 mLs by mouth 3 (three) times daily between meals. 10/05/19   Cherylann Ratel A, DO  magnesium oxide (MAG-OX) 400 (241.3 Mg) MG tablet Take 1 tablet (400 mg total) by mouth 2 (two) times daily for 14 days. 10/05/19 10/19/19  Cherylann Ratel A, DO  Multiple Vitamin (MULTIVITAMIN WITH MINERALS) TABS tablet Take 1 tablet by mouth daily. 10/06/19 11/05/19  Cherylann Ratel A, DO  polyethylene glycol (MIRALAX / GLYCOLAX) 17 g packet Take 17 g by mouth daily. 10/06/19   Cherylann Ratel A, DO  tamsulosin (FLOMAX) 0.4 MG CAPS capsule Take 1  capsule (0.4 mg total) by mouth daily. 10/06/19 12/05/19  Cherylann Ratel A, DO  tuberculin (TUBERSOL) 5 UNIT/0.1ML injection Inject 5 Units into the skin once. 10/05/19 10/20/19  [provider]    Allergies Penicillins  Family History  Problem Relation Age of Onset  . Prostate cancer Other     Social History Social History   Tobacco Use  . Smoking status: Current Every Day Smoker    Packs/day: 1.00    Years: 60.00    Pack years: 60.00  . Smokeless tobacco: Never Used   Substance Use Topics  . Alcohol use: Yes    Frequency: Never    Comment: Social  . Drug use: Yes    Types: Marijuana    Comment: Daily x50 years      Review of Systems Constitutional: No fever/chills Eyes: No visual changes. ENT: No sore throat. Cardiovascular: Denies chest pain. Respiratory: Denies shortness of breath. Gastrointestinal: No abdominal pain.  No nausea, no vomiting.  No diarrhea.  No constipation. Genitourinary: Negative for dysuria.  Penile pain, catheter removed Musculoskeletal: Negative for back pain. Skin: Negative for rash. Neurological: Negative for headaches, focal weakness or numbness. All other ROS negative ____________________________________________   PHYSICAL EXAM:  VITAL SIGNS: ED Triage Vitals [10/15/19 1546]  Enc Vitals Group     BP 91/61     Pulse Rate 94     Resp 18     Temp 99.9 F (37.7 C)     Temp Source Oral     SpO2 100 %     Weight      Height      Head Circumference      Peak Flow      Pain Score 0     Pain Loc      Pain Edu?      Excl. in [place]?     Constitutional: Alert and oriented. Well appearing and in no acute distress. Eyes: Conjunctivae are normal. EOMI. Head: Atraumatic. Nose: No congestion/rhinnorhea. Mouth/Throat: Mucous membranes are moist.   Neck: No stridor. Trachea Midline. FROM Cardiovascular: Normal rate, regular rhythm. Grossly normal heart sounds.  Good peripheral circulation. Respiratory: Normal respiratory effort.  No retractions. Lungs CTAB. Gastrointestinal: Soft and nontender. No distention. No abdominal bruits.  Colostomy bag in place. Musculoskeletal: No lower extremity tenderness nor edema.  No joint effusions. Neurologic:  Normal speech and language. No gross focal neurologic deficits are appreciated.  Skin:  Skin is warm, dry and intact. No rash noted. Psychiatric: Mood and affect are normal. Speech and behavior are normal. GU: Catheter has been replaced without any blood around the urethra.   No testicle tenderness.  ____________________________________________   LABS (all labs ordered are listed, but only abnormal results are displayed)  Labs Reviewed  CBC WITH DIFFERENTIAL/PLATELET - Abnormal; Notable for the following components:      Result Value   WBC 12.0 (*)    RBC 3.17 (*)    Hemoglobin 9.6 (*)    HCT 30.0 (*)    Neutro Abs 10.2 (*)    All other components within normal limits  COMPREHENSIVE METABOLIC PANEL - Abnormal; Notable for the following components:   Potassium 3.2 (*)    Glucose, Bld 133 (*)    Creatinine, Ser 1.28 (*)    Calcium 8.0 (*)    Total Protein 6.0 (*)    Albumin 1.9 (*)    GFR calc non Af Amer 53 (*)    All other components within normal limits  URINALYSIS,  COMPLETE (UACMP) WITH MICROSCOPIC - Abnormal; Notable for the following components:   Color, Urine YELLOW (*)    APPearance HAZY (*)    Hgb urine dipstick LARGE (*)    Protein, ur 30 (*)    Leukocytes,Ua TRACE (*)    RBC / HPF >50 (*)    Bacteria, UA MANY (*)    All other components within normal limits  SARS CORONAVIRUS 2 BY RT PCR (HOSPITAL ORDER, [facility])  URINE CULTURE  CULTURE, BLOOD (ROUTINE X 2)  CULTURE, BLOOD (ROUTINE X 2)  GI PATHOGEN PANEL BY PCR, STOOL  C DIFFICILE QUICK SCREEN W PCR REFLEX  LACTIC ACID, PLASMA  LACTIC ACID, PLASMA   ____________________________________________   ED ECG REPORT I, Vanessa Marathon, the attending physician, personally viewed and interpreted this ECG.  EKG is difficult to interpret due to patient's baseline tremors however appears regular in nature.  No obvious ST elevation or T wave inversion.  Unable to fully determine intervals. ____________________________________________  [place], personally viewed and evaluated these images (plain radiographs) as part of my medical decision making, as well as reviewing the written report by the radiologist.  ED MD interpretation: Possible  pneumonia on chest x-ray.  Official radiology report(s): Dg Chest Portable 1 View  Result Date: 10/15/2019 CLINICAL DATA:  Fever EXAM: PORTABLE CHEST 1 VIEW COMPARISON:  September 27, 2015 FINDINGS: There is soft tissue fullness in the left hilar region concerning for adenopathy. There is questionable associated infiltrate in this area. Lungs elsewhere are clear. Heart size is normal. Pulmonary vascularity appears normal. No appreciable bone lesions. IMPRESSION: Suspect left hilar adenopathy with questionable associated pneumonia. Given concern for neoplastic involvement in this area, contrast enhanced chest CT advised. Lungs elsewhere clear.  Heart size normal. Electronically Signed   By: Lowella Grip III M.D.   On: 10/15/2019 16:39    ____________________________________________   PROCEDURES  Procedure(s) performed (including Critical Care):  .Critical Care Performed by: Vanessa [place], MD Authorized by: Vanessa [place], MD   Critical care provider statement:    Critical care time (minutes):  45   Critical care was necessary to treat or prevent imminent or life-threatening deterioration of the following conditions:  Sepsis   Critical care was time spent personally by me on the following activities:  Discussions with consultants, evaluation of patient's response to treatment, examination of patient, ordering and performing treatments and interventions, ordering and review of laboratory studies, ordering and review of radiographic studies, pulse oximetry, re-evaluation of patient's condition, obtaining history from patient or surrogate and review of old charts     ____________________________________________   INITIAL IMPRESSION / ASSESSMENT AND PLAN / ED COURSE  Hairo Dooly was evaluated in Emergency Department on 10/15/2019 for the symptoms described in the history of present illness. He was evaluated in the context of the global COVID-19 pandemic, which necessitated consideration  that the patient might be at risk for infection with the SARS-CoV-2 virus that causes COVID-19. Institutional protocols and algorithms that pertain to the evaluation of patients at risk for COVID-19 are in a state of rapid change based on information released by regulatory bodies including the CDC and federal and state organizations. These policies and algorithms were followed during the patient's care in the ED.    Patient is an 78 year old who presents for catheter replacement but was found to be febrile and hypotensive.  This concerning for sepsis.  Will get work-up to evaluate for UTI, pneumonia,  bacteremia.  Given patient's recent surgery will get CT abdomen to ensure there is no evidence of abscess collection.  We will also get CT PE to evaluate for blood clot given his cancer history and he is a little hypoxic as well.  Patient is white count is elevated 12 and with his pulse rate of greater than 90 he meets sirs criteria.  Patient started on broad-spectrum antibiotics.  Patient CT scan shows known rectal mass with concern for metastatic disease in his chest small fluid collection near the rectal mass concerning for small postoperative abscess.  9:02 PM D/w Zacarias Pontes patient getting fluids now and will reassess blood pressure to see if he stable for floor bed.  Blood pressure soft with maps 60-65.  9:45 PM patient not febrile to 102.  Will give Tylenol additional liter of fluid and send stool samples.   10:45 PM Patient's blood pressure is better after the fluids.  Discussed with Dr.Kararkandy and there are no beds at [facility] but patient was accepted to stepdown at [facility] long.       ____________________________________________   FINAL CLINICAL IMPRESSION(S) / ED DIAGNOSES   Final diagnoses:  Sepsis, due to unspecified organism, unspecified whether acute organ dysfunction present [facility])      MEDICATIONS GIVEN DURING THIS VISIT:  Medications  sodium chloride 0.9 % bolus 500  mL (0 mLs Intravenous Stopped 10/15/19 1706)  ceFEPIme (MAXIPIME) 2 g in sodium chloride 0.9 % 100 mL IVPB (0 g Intravenous Stopped 10/15/19 1911)  metroNIDAZOLE (FLAGYL) IVPB 500 mg (0 mg Intravenous Stopped 10/15/19 1911)  vancomycin (VANCOCIN) IVPB 1000 mg/200 mL premix (1,000 mg Intravenous New Bag/Given 10/15/19 2123)  vancomycin (VANCOCIN) 500 mg in sodium chloride 0.9 % 100 mL IVPB (0 mg Intravenous Stopped 10/15/19 2235)  iohexol (OMNIPAQUE) 350 MG/ML injection 75 mL (75 mLs Intravenous Contrast Given 10/15/19 1839)  sodium chloride 0.9 % bolus 1,000 mL (0 mLs Intravenous Stopped 10/15/19 2235)  acetaminophen (TYLENOL) tablet 1,000 mg (1,000 mg Oral Given 10/15/19 2152)  sodium chloride 0.9 % bolus 1,000 mL (1,000 mLs Intravenous New Bag/Given 10/15/19 2214)     ED Discharge Orders    None       Note:  This document was prepared using Dragon voice recognition software and may include unintentional dictation errors.   Vanessa [address], MD 10/15/19 2245

## 2019-10-15 NOTE — Telephone Encounter (Signed)
Appt has been scheduled. Nothing further is needed at this time.

## 2019-10-15 NOTE — ED Notes (Signed)
IV in the right forearm has infiltrated. Only NS has been put through the iv. Pt is shaking and febrile at this time and will answer questions but is in mild distress. IV catheter in place. Ice placed on infiltrate and elevated. Pt tolerating well. Supplies received from orderly. New colostomy bag replaced with a 13mm bag. Area around the stoma cleaned and new wafer and bag applied. Pt tolerated well. Stool sample from the colostomy bag sent to the lab. Pt is hot to touch. Rectal temp taken and is 102.6. Pt had been afebrile at [place]. Bed is wet with body fluids. Pt cleaned and all linens changed and given new blankets. This RN attempted another IV on the left side with nos success. Butch RN going to attempt. NS and Vanc infusing in the left ac and pt is tolerating well.

## 2019-10-15 NOTE — Progress Notes (Signed)
PHARMACY -  BRIEF ANTIBIOTIC NOTE   Pharmacy has received consult(s) for Vancomycin and Cefepime from an ED provider.  The patient's profile has been reviewed for ht/wt/allergies/indication/available labs.    One time order(s) placed for Vancomycin 1500mg (as 2 separate doses) and Cefepime 2g x 1.  Further antibiotics/pharmacy consults should be ordered by admitting physician if indicated.                       Thank you, Pearla Dubonnet 10/15/2019  5:23 PM

## 2019-10-15 NOTE — ED Notes (Signed)
Report called to [facility] to room 8 via stretcher

## 2019-10-15 NOTE — ED Notes (Signed)
Pt repositioned in bed. Patient belongings placed in patient belonging bag at bedside. This RN explained to pt need for admission due to patient stating "I need to go". Pt stated understanding.

## 2019-10-15 NOTE — ED Notes (Signed)
MD made aware of pt status. New order received for tylenol. Pt given PO fluid as well.

## 2019-10-15 NOTE — ED Notes (Addendum)
Pt back from CT. Pt colostomy bag began to leak while in CT. Staff taped over the the wafer to prevent leakage. Staff unable to replace bag at this time as supplies are not stocked in ED. Floor called to send supplies. Bloody urine noted in catheter at this time.

## 2019-10-15 NOTE — ED Triage Notes (Signed)
Presents via EMS from [facility]  EMS was called for cath displacement  But found pt febrile and sl hypotensive on their arrival

## 2019-10-15 NOTE — ED Notes (Signed)
Patient is resting comfortably. 

## 2019-10-15 NOTE — ED Notes (Signed)
ED Provider at bedside. 

## 2019-10-16 ENCOUNTER — Inpatient Hospital Stay (HOSPITAL_COMMUNITY): Payer: Medicare Other

## 2019-10-16 ENCOUNTER — Encounter (HOSPITAL_COMMUNITY): Payer: Self-pay | Admitting: Physician Assistant

## 2019-10-16 ENCOUNTER — Inpatient Hospital Stay (HOSPITAL_COMMUNITY)
Admission: AD | Admit: 2019-10-16 | Discharge: 2019-10-25 | DRG: 698 | Disposition: A | Discharge status: Hospice | Payer: Medicare Other | Source: Other Acute Inpatient Hospital | Attending: Internal Medicine | Admitting: Internal Medicine

## 2019-10-16 DIAGNOSIS — N179 Acute kidney failure, unspecified: Secondary | ICD-10-CM | POA: Diagnosis present

## 2019-10-16 DIAGNOSIS — R531 Weakness: Secondary | ICD-10-CM

## 2019-10-16 DIAGNOSIS — E876 Hypokalemia: Secondary | ICD-10-CM | POA: Diagnosis present

## 2019-10-16 DIAGNOSIS — A4181 Sepsis due to Enterococcus: Secondary | ICD-10-CM | POA: Diagnosis not present

## 2019-10-16 DIAGNOSIS — Z20828 Contact with and (suspected) exposure to other viral communicable diseases: Secondary | ICD-10-CM | POA: Diagnosis present

## 2019-10-16 DIAGNOSIS — R6521 Severe sepsis with septic shock: Secondary | ICD-10-CM | POA: Diagnosis present

## 2019-10-16 DIAGNOSIS — C20 Malignant neoplasm of rectum: Secondary | ICD-10-CM | POA: Diagnosis not present

## 2019-10-16 DIAGNOSIS — A419 Sepsis, unspecified organism: Secondary | ICD-10-CM | POA: Diagnosis present

## 2019-10-16 DIAGNOSIS — Z8546 Personal history of malignant neoplasm of prostate: Secondary | ICD-10-CM | POA: Diagnosis not present

## 2019-10-16 DIAGNOSIS — C787 Secondary malignant neoplasm of liver and intrahepatic bile duct: Secondary | ICD-10-CM | POA: Diagnosis present

## 2019-10-16 DIAGNOSIS — Z933 Colostomy status: Secondary | ICD-10-CM

## 2019-10-16 DIAGNOSIS — Z452 Encounter for adjustment and management of vascular access device: Secondary | ICD-10-CM

## 2019-10-16 DIAGNOSIS — N390 Urinary tract infection, site not specified: Secondary | ICD-10-CM | POA: Diagnosis not present

## 2019-10-16 DIAGNOSIS — Z681 Body mass index (BMI) 19 or less, adult: Secondary | ICD-10-CM

## 2019-10-16 DIAGNOSIS — Z515 Encounter for palliative care: Secondary | ICD-10-CM | POA: Diagnosis not present

## 2019-10-16 DIAGNOSIS — Z923 Personal history of irradiation: Secondary | ICD-10-CM

## 2019-10-16 DIAGNOSIS — C19 Malignant neoplasm of rectosigmoid junction: Secondary | ICD-10-CM | POA: Diagnosis present

## 2019-10-16 DIAGNOSIS — C61 Malignant neoplasm of prostate: Secondary | ICD-10-CM | POA: Diagnosis not present

## 2019-10-16 DIAGNOSIS — B952 Enterococcus as the cause of diseases classified elsewhere: Principal | ICD-10-CM

## 2019-10-16 DIAGNOSIS — I361 Nonrheumatic tricuspid (valve) insufficiency: Secondary | ICD-10-CM | POA: Diagnosis not present

## 2019-10-16 DIAGNOSIS — F17210 Nicotine dependence, cigarettes, uncomplicated: Secondary | ICD-10-CM | POA: Diagnosis present

## 2019-10-16 DIAGNOSIS — R160 Hepatomegaly, not elsewhere classified: Secondary | ICD-10-CM | POA: Diagnosis not present

## 2019-10-16 DIAGNOSIS — E785 Hyperlipidemia, unspecified: Secondary | ICD-10-CM | POA: Diagnosis present

## 2019-10-16 DIAGNOSIS — K611 Rectal abscess: Secondary | ICD-10-CM | POA: Diagnosis not present

## 2019-10-16 DIAGNOSIS — C227 Other specified carcinomas of liver: Secondary | ICD-10-CM | POA: Diagnosis not present

## 2019-10-16 DIAGNOSIS — T83511A Infection and inflammatory reaction due to indwelling urethral catheter, initial encounter: Secondary | ICD-10-CM | POA: Diagnosis present

## 2019-10-16 DIAGNOSIS — R7881 Bacteremia: Secondary | ICD-10-CM | POA: Diagnosis not present

## 2019-10-16 DIAGNOSIS — Z66 Do not resuscitate: Secondary | ICD-10-CM

## 2019-10-16 DIAGNOSIS — J449 Chronic obstructive pulmonary disease, unspecified: Secondary | ICD-10-CM | POA: Diagnosis present

## 2019-10-16 DIAGNOSIS — K629 Disease of anus and rectum, unspecified: Secondary | ICD-10-CM | POA: Diagnosis not present

## 2019-10-16 DIAGNOSIS — E079 Disorder of thyroid, unspecified: Secondary | ICD-10-CM | POA: Diagnosis present

## 2019-10-16 DIAGNOSIS — Z880 Allergy status to penicillin: Secondary | ICD-10-CM

## 2019-10-16 DIAGNOSIS — C7800 Secondary malignant neoplasm of unspecified lung: Secondary | ICD-10-CM | POA: Diagnosis present

## 2019-10-16 DIAGNOSIS — R509 Fever, unspecified: Secondary | ICD-10-CM | POA: Diagnosis present

## 2019-10-16 DIAGNOSIS — R918 Other nonspecific abnormal finding of lung field: Secondary | ICD-10-CM | POA: Diagnosis not present

## 2019-10-16 DIAGNOSIS — E559 Vitamin D deficiency, unspecified: Secondary | ICD-10-CM | POA: Diagnosis present

## 2019-10-16 DIAGNOSIS — T8141XA Infection following a procedure, superficial incisional surgical site, initial encounter: Secondary | ICD-10-CM | POA: Diagnosis present

## 2019-10-16 DIAGNOSIS — Z8042 Family history of malignant neoplasm of prostate: Secondary | ICD-10-CM

## 2019-10-16 DIAGNOSIS — K56609 Unspecified intestinal obstruction, unspecified as to partial versus complete obstruction: Secondary | ICD-10-CM | POA: Diagnosis present

## 2019-10-16 DIAGNOSIS — E43 Unspecified severe protein-calorie malnutrition: Secondary | ICD-10-CM | POA: Diagnosis present

## 2019-10-16 DIAGNOSIS — B965 Pseudomonas (aeruginosa) (mallei) (pseudomallei) as the cause of diseases classified elsewhere: Secondary | ICD-10-CM | POA: Diagnosis present

## 2019-10-16 HISTORY — DX: Volvulus: K56.2

## 2019-10-16 HISTORY — DX: Malignant neoplasm of colon, unspecified: C18.9

## 2019-10-16 LAB — GLUCOSE, CAPILLARY
Glucose-Capillary: 130 mg/dL — ABNORMAL HIGH (ref 70–99)
Glucose-Capillary: 94 mg/dL (ref 70–99)

## 2019-10-16 LAB — PHOSPHORUS: Phosphorus: 4 mg/dL (ref 2.5–4.6)

## 2019-10-16 LAB — BASIC METABOLIC PANEL
Anion gap: 9 (ref 5–15)
Anion gap: 8 (ref 5–15)
BUN: 15 mg/dL (ref 8–23)
BUN: 16 mg/dL (ref 8–23)
CO2: 21 mmol/L — ABNORMAL LOW (ref 22–32)
CO2: 23 mmol/L (ref 22–32)
Calcium: 7.4 mg/dL — ABNORMAL LOW (ref 8.9–10.3)
Calcium: 7.2 mg/dL — ABNORMAL LOW (ref 8.9–10.3)
Chloride: 111 mmol/L (ref 98–111)
Chloride: 108 mmol/L (ref 98–111)
Creatinine, Ser: 1.2 mg/dL (ref 0.61–1.24)
Creatinine, Ser: 1.05 mg/dL (ref 0.61–1.24)
GFR calc Af Amer: >60 mL/min (ref >60)
GFR calc Af Amer: >60 mL/min (ref >60)
GFR calc non Af Amer: 57 mL/min — ABNORMAL LOW (ref >60)
GFR calc non Af Amer: >60 mL/min (ref >60)
Glucose, Bld: 127 mg/dL — ABNORMAL HIGH (ref 70–99)
Glucose, Bld: 163 mg/dL — ABNORMAL HIGH (ref 70–99)
Potassium: 2.9 mmol/L — ABNORMAL LOW (ref 3.5–5.1)
Potassium: 3.1 mmol/L — ABNORMAL LOW (ref 3.5–5.1)
Sodium: 141 mmol/L (ref 135–145)
Sodium: 139 mmol/L (ref 135–145)

## 2019-10-16 LAB — CBC
HCT: 30.5 % — ABNORMAL LOW (ref 39.0–52.0)
Hemoglobin: 9.7 g/dL — ABNORMAL LOW (ref 13.0–17.0)
MCH: 30.6 pg (ref 26.0–34.0)
MCHC: 31.8 g/dL (ref 30.0–36.0)
MCV: 96.2 fL (ref 80.0–100.0)
Platelets: 266 10*3/uL (ref 150–400)
RBC: 3.17 MIL/uL — ABNORMAL LOW (ref 4.22–5.81)
RDW: 14.6 % (ref 11.5–15.5)
WBC: 15.7 10*3/uL — ABNORMAL HIGH (ref 4.0–10.5)
nRBC: 0 % (ref 0.0–0.2)

## 2019-10-16 LAB — C DIFFICILE QUICK SCREEN W PCR REFLEX
C Diff antigen: POSITIVE — AB
C Diff toxin: NEGATIVE

## 2019-10-16 LAB — ECHOCARDIOGRAM COMPLETE: Weight: 1950.63 oz

## 2019-10-16 LAB — MRSA PCR SCREENING: MRSA by PCR: NEGATIVE

## 2019-10-16 LAB — MAGNESIUM: Magnesium: 1.8 mg/dL (ref 1.7–2.4)

## 2019-10-16 LAB — CLOSTRIDIUM DIFFICILE BY PCR, REFLEXED: Toxigenic C. Difficile by PCR: NEGATIVE

## 2019-10-16 MED ORDER — LACTATED RINGERS IV SOLN
INTRAVENOUS | Status: AC
  Administered 2019-10-16 – 2019-10-17 (×4): via INTRAVENOUS

## 2019-10-16 MED ORDER — CHLORHEXIDINE GLUCONATE CLOTH 2 % EX PADS
6.0000 | MEDICATED_PAD | Freq: Every day | CUTANEOUS | Status: DC
  Administered 2019-10-17 – 2019-10-23 (×6): 6 via TOPICAL

## 2019-10-16 MED ORDER — TAMSULOSIN HCL 0.4 MG PO CAPS
0.4000 mg | ORAL_CAPSULE | Freq: Every day | ORAL | Status: DC
  Administered 2019-10-16 – 2019-10-18 (×3): 0.4 mg via ORAL
  Filled 2019-10-16 (×3): qty 1

## 2019-10-16 MED ORDER — DOCUSATE SODIUM 100 MG PO CAPS
100.0000 mg | ORAL_CAPSULE | Freq: Every day | ORAL | Status: DC
  Administered 2019-10-17 – 2019-10-22 (×3): 100 mg via ORAL
  Filled 2019-10-16 (×8): qty 1

## 2019-10-16 MED ORDER — LACTATED RINGERS IV BOLUS
1000.0000 mL | Freq: Once | INTRAVENOUS | Status: AC
  Administered 2019-10-16: 1000 mL via INTRAVENOUS

## 2019-10-16 MED ORDER — NOREPINEPHRINE 4 MG/250ML-% IV SOLN
0.0000 ug/min | INTRAVENOUS | Status: DC
  Administered 2019-10-16: 2 ug/min via INTRAVENOUS
  Filled 2019-10-16: qty 250

## 2019-10-16 MED ORDER — VANCOMYCIN HCL 10 G IV SOLR
1250.0000 mg | INTRAVENOUS | Status: DC
  Filled 2019-10-16: qty 1250

## 2019-10-16 MED ORDER — SODIUM CHLORIDE 0.9 % IV SOLN
2.0000 g | Freq: Two times a day (BID) | INTRAVENOUS | Status: DC
  Administered 2019-10-16 – 2019-10-17 (×3): 2 g via INTRAVENOUS
  Filled 2019-10-16 (×5): qty 2

## 2019-10-16 MED ORDER — DIFLUPREDNATE 0.05 % OP EMUL: 1.0000 [drp] | Freq: Every day | OPHTHALMIC | Status: DC

## 2019-10-16 MED ORDER — METRONIDAZOLE IN NACL 5-0.79 MG/ML-% IV SOLN
500.0000 mg | Freq: Three times a day (TID) | INTRAVENOUS | Status: DC
  Administered 2019-10-16 – 2019-10-17 (×4): 500 mg via INTRAVENOUS
  Filled 2019-10-16 (×4): qty 100

## 2019-10-16 MED ORDER — ACETAMINOPHEN 325 MG PO TABS: 650.0000 mg | ORAL_TABLET | ORAL | Status: DC | PRN

## 2019-10-16 MED ORDER — POTASSIUM CHLORIDE 10 MEQ/100ML IV SOLN
10.0000 meq | INTRAVENOUS | Status: DC
  Administered 2019-10-16: 10 meq via INTRAVENOUS
  Filled 2019-10-16 (×2): qty 100

## 2019-10-16 MED ORDER — MAGNESIUM OXIDE 400 (241.3 MG) MG PO TABS
400.0000 mg | ORAL_TABLET | Freq: Two times a day (BID) | ORAL | Status: DC
  Administered 2019-10-16 – 2019-10-25 (×18): 400 mg via ORAL
  Filled 2019-10-16 (×18): qty 1

## 2019-10-16 MED ORDER — POTASSIUM CHLORIDE CRYS ER 20 MEQ PO TBCR
40.0000 meq | EXTENDED_RELEASE_TABLET | Freq: Once | ORAL | Status: AC
  Administered 2019-10-16: 11:00:00 40 meq via ORAL
  Filled 2019-10-16: qty 2

## 2019-10-16 MED ORDER — ADULT MULTIVITAMIN W/MINERALS CH
1.0000 | ORAL_TABLET | Freq: Every day | ORAL | Status: DC
  Administered 2019-10-16 – 2019-10-25 (×10): 1 via ORAL
  Filled 2019-10-16 (×10): qty 1

## 2019-10-16 MED ORDER — ENSURE ENLIVE PO LIQD
237.0000 mL | Freq: Three times a day (TID) | ORAL | Status: DC
  Administered 2019-10-16 – 2019-10-17 (×4): 237 mL via ORAL

## 2019-10-16 MED ORDER — NOREPINEPHRINE 4 MG/250ML-% IV SOLN
0.0000 ug/min | INTRAVENOUS | Status: DC
  Administered 2019-10-16 (×2): 12 ug/min via INTRAVENOUS
  Administered 2019-10-16: 10 ug/min via INTRAVENOUS
  Administered 2019-10-17: 5 ug/min via INTRAVENOUS
  Administered 2019-10-17 (×2): 10 ug/min via INTRAVENOUS
  Administered 2019-10-18: 5 ug/min via INTRAVENOUS
  Administered 2019-10-19: 4 ug/min via INTRAVENOUS
  Filled 2019-10-16 (×8): qty 250

## 2019-10-16 MED ORDER — HEPARIN SODIUM (PORCINE) 5000 UNIT/ML IJ SOLN
5000.0000 [IU] | Freq: Three times a day (TID) | INTRAMUSCULAR | Status: DC
  Administered 2019-10-16 – 2019-10-24 (×21): 5000 [IU] via SUBCUTANEOUS
  Filled 2019-10-16 (×23): qty 1

## 2019-10-16 MED ORDER — OXYCODONE HCL 5 MG PO TABS: 5.0000 mg | ORAL_TABLET | ORAL | Status: DC | PRN

## 2019-10-16 MED ORDER — POTASSIUM CHLORIDE CRYS ER 20 MEQ PO TBCR
30.0000 meq | EXTENDED_RELEASE_TABLET | ORAL | Status: AC
  Administered 2019-10-16 (×2): 30 meq via ORAL
  Filled 2019-10-16 (×2): qty 1

## 2019-10-16 MED ORDER — SODIUM CHLORIDE 0.9 % IV SOLN
250.0000 mL | INTRAVENOUS | Status: DC
  Administered 2019-10-16: 250 mL via INTRAVENOUS

## 2019-10-16 NOTE — ED Provider Notes (Signed)
Patient started on levophed for septic shock unresponsive to IVF. Discussed with Charles Espinoza as patient had initially been accepted to stepdown. Patient now accepted to [facility] ICU.    Alfred Levins, [place], MD 10/16/19 0200

## 2019-10-16 NOTE — Progress Notes (Signed)
Initial Nutrition Assessment  RD working remotely.  DOCUMENTATION CODES:   Not applicable, suspect pt with malnutrition but unable to confirm without NFPE  INTERVENTION:   - Once diet advanced, Ensure Enlive po TID, each supplement provides 350 kcal and 20 grams of protein  - Once diet advanced, Magic cup TID with meals, each supplement provides 290 kcal and 9 grams of protein  - MVI with minerals daily  - Encourage adequate PO intake  NUTRITION DIAGNOSIS:   Inadequate oral intake related to inability to eat as evidenced by NPO status.  GOAL:   Patient will meet greater than or equal to 90% of their needs  MONITOR:   Diet advancement, Labs, Weight trends, Skin, I & O's, Supplement acceptance  REASON FOR ASSESSMENT:   Malnutrition Screening Tool    ASSESSMENT:   78 year old male who presented to the ED after pulling out Foley and having fever and hypotension. PMH recent sigmoid malignant ulceration with subsequent perforation s/p ex-lap with diverting colostomy, history of prostate cancer and likely recently diagnosed metastatic colorectal cancer, COPD, HLD. Pt admitted with severe sepsis secondary to postop rectal abscess vs UTI/pyelo.   Per review of notes, pt is confused and agitated this AM, yelling at Surgery PA and RN. RD did not attempt to call pt at this time. Will attempt to obtain diet and weight history upon follow-up.  Pt is NPO at this time but per Surgery note, can have a diet from their standpoint. RD will monitor for diet advancement and order oral nutrition supplements.  Reviewed weight history in chart. Pt with a weight loss of 15 kg since 09/23/19. This is a 21.3% weight loss which is severe and significant for timeframe. Strongly suspect pt with severe malnutrition but unable to confirm at this time.  Medications reviewed and include: Colace, Ensure Enlive TID, magnesium oxide 400 mg BID, Klor-con 40 mEq once, IV abx, Levophed IVF: NS @ 10 ml/hr, LR @  125 ml/hr  Labs reviewed: potassium 2.9, hemoglobin 9.7 CBG's: 94, 130  NUTRITION - FOCUSED PHYSICAL EXAM:  Unable to complete at this time. RD working remotely.  Diet Order:   Diet Order            Diet NPO time specified  Diet effective now              EDUCATION NEEDS:   Not appropriate for education at this time  Skin:  Skin Assessment: Skin Integrity Issues: Skin Integrity Issues: Stage II: sacrum Incisions: abdomen  Last BM:  10/16/19 colostomy  Height:   Ht Readings from Last 1 Encounters:  10/09/19 5\' 8"  (1.727 m)    Weight:   Wt Readings from Last 1 Encounters:  10/16/19 55.3 kg    Ideal Body Weight:  70 kg  BMI:  Body mass index is 18.54 kg/m.  Estimated Nutritional Needs:   Kcal:  1800-2000  Protein:  80-100 grams  Fluid:  >/= 1.8 L    Gaynell Face, MS, RD, LDN Inpatient Clinical Dietitian Pager: [phone] Weekend/After Hours: [phone]

## 2019-10-16 NOTE — ED Notes (Signed)
Dr Alfred Levins has a goal of a MAP of 60. She wants to keep the levophed as low as possible. Pt has had higher BP with fluids. Waiting to see how pt status is before pt is transferred to either [facility] or [facility].

## 2019-10-16 NOTE — ED Notes (Signed)
Pt becomes aggitated when this RN comes in and messes with IV. Explained to pt he needs meds. Pt goes back to sleep.

## 2019-10-16 NOTE — Progress Notes (Signed)
  Echocardiogram 2D Echocardiogram has been performed.  Charles Espinoza 10/16/2019, 10:40 AM

## 2019-10-16 NOTE — Progress Notes (Signed)
eLink Physician-Brief Progress Note Patient Name: Charles Espinoza DOB: [birth year] MRN: [id number]   Date of Service  10/16/2019  HPI/Events of Note  49 M prostate CA, more recently colorectal adenocarcinoma with liver metastasis s/p exlap with diverting colostomy 10/5. He presents for Foley catheter replacement but was noted to be hypotensive and febrile. CT abdomen with fluid collection adjacent to rectal mass suspicious for abscess.  Urine with 6-10 WBC many bacteria. Given 3.5 liters of crystalloids and started on pressors.  eICU Interventions  Septic shock, possible rectal abscess, UTI less likely. On broad spectrum antibiotics, surgery consulted.     Intervention Category Major Interventions: Sepsis - evaluation and management;Hypotension - evaluation and management Evaluation Type: New Patient Evaluation  Judd Lien 10/16/2019, 4:51 AM

## 2019-10-16 NOTE — Procedures (Signed)
Central Venous Catheter Insertion Procedure Note Charles Espinoza [id number] 10/11/1941  Procedure: Insertion of Central Venous Catheter Indications: Assessment of intravascular volume, Drug and/or fluid administration and Frequent blood sampling  Procedure Details Consent: Risks of procedure as well as the alternatives and risks of each were explained to the (patient/caregiver).  Consent for procedure obtained. Time Out: Verified patient identification, verified procedure, site/side was marked, verified correct patient position, special equipment/implants available, medications/allergies/relevent history reviewed, required imaging and test results available.  Performed  Maximum sterile technique was used including antiseptics, cap, gloves, gown, hand hygiene, mask and sheet. Skin prep: Chlorhexidine; local anesthetic administered A antimicrobial bonded/coated triple lumen catheter was placed in the left internal jugular vein using the Seldinger technique.  Evaluation Blood flow good Complications: No apparent complications Patient did tolerate procedure well. Chest X-ray ordered to verify placement.  CXR: pending.  Charles Cancel, NP-C [facility] After hours pager: [phone]. 10/16/2019, 12:38 PM

## 2019-10-16 NOTE — Progress Notes (Signed)
PHARMACY - PHYSICIAN COMMUNICATION CRITICAL VALUE ALERT - BLOOD CULTURE IDENTIFICATION (BCID)  Charles Espinoza is an 78 y.o. male who presented to [facility] on 10/15/2019 with a chief complaint of fever  Assessment:  Lab reported 1 of 4 bottles + for GPC.   Name of physician (or Provider) Contacted: G. Abbott [place] at [facility] as pt transferred to [facility] ICU  Current antibiotics: Vancomycin and Cefepime  Changes to prescribed antibiotics recommended:  None, pt on appropriate ABX  No results found for this or any previous visit.  [name] 10/16/2019  6:47 AM

## 2019-10-16 NOTE — H&P (Addendum)
NAME:  Charles Espinoza, MRN:  [id number], DOB:  25-May-1941, LOS: 0 ADMISSION DATE:  10/16/2019, CONSULTATION DATE:  10/16/19 REFERRING MD:  Dr. Alfred Levins, CHIEF COMPLAINT:  Sepsis    Brief History   78 year old male with recent sigmoid malignant ulceration with subsequent perforation and he underwent ex lap with diverting colostomy, history of prostate cancer and likely recently diagnosed metastatic colorectal cancer who presented to the emergency department with fever and hypotension and likely small rectal postoperative abscess.  History of present illness   Charles Espinoza is a 78 year old male with a history of prostate cancer treated with radiation in 2014-2015 who was recently admitted earlier this month for abdominal pain and found to have complex sigmoid collection.  He underwent an exlap with diverting colostomy on 09/23/2019.  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  He was referred to oncology outpatient and discharged to rehab.  Patient is not sure when his last colonoscopy was.  Yesterday, patient apparently pulled out his Foley catheter in his sleep, it appears that EMS was called for this and found patient to be febrile and hypotensive so he was taken to the emergency department.  In the ED, patient's blood pressure was as low as 66/44, temp 102F.  Labs show leukocytosis of 12.0, creatinine 1.28, K3.2 and normal lactic acid, UA with bacteria and leukocytes.  CT head was without acute findings, CTA chest showed no PE but left lung density likely related to metastatic disease, CT abdomen pelvis with postoperative changes but noted small 3.0 x 2.0 cm fluid collection adjacent to rectal mass suspicious for postoperative abscess.  He was started on Levophed and PCCM consulted for admission.  At the time of admission, patient states that he has been feeling well with no complaints.  He did  not notice any abdominal or rectal pain, no myalgias.  Palliative care was consulted during last admission and patient and family desire aggressive treatment plan and he is full code.  Past Medical History   Past Medical History:  Diagnosis Date  . Colon cancer ([place])   . COPD (chronic obstructive pulmonary disease) ([place])   . Elevated PSA   . Hx of radiation therapy 09/25/13- 11/27/13   prostate 7800 cGy 39 sessions  . Hyperlipidemia   . Prostate cancer ([place]) 02/27/13   Charles Espinoza 6, volume 133 cc  . Prostatitis   . Sigmoid volvulus ([place])    s/p ex-lap  . Vitamin D deficiency      Significant Hospital Events   10/28 Admit to PCCM  Consults:  Surgery  Procedures:    Significant Diagnostic Tests:  CT head>>no acute findings CT Chest>> L Lung demonstrates confluent density arising from the left suprahilar region extending superiorly into the upper lobe CT abd/pelvis>>Liver demonstrates evidence of an 8.8 x 7.7 cm centrally necrotic mass lesion, small fluid attenuation area adjacent to the rectal mass, 3 x 2 cm and is suspicious for a small postoperative abscess, Patchy enhancement pattern particularly within the right kidney suspicious for pyelonephritis.   Micro Data:  10/28 BCx2>> 10/28 UC>> 10/27 Sars-CoV-2>>neg  Antimicrobials:  Vancomycin 10/28- Cefepime 10/28-  Interim history/subjective:  Transferred from [facility] to [facility], pt awake and stable on 10 mcg of Levophed  Objective   Temperature 97.7 F (36.5 C), temperature source Oral.       No intake or output data in the 24 hours ending 10/16/19 0450 There were  no vitals filed for this visit.  General: Thin, elderly male, and in no acute distress HEENT: MM pink/dry Neuro: Alert and oriented x3, no focal deficits CV: s1s2 regular rate and rhythm, no m/r/g PULM: CTAB on room air GI: soft, bsx4 active, colostomy bag present Extremities: warm/dry, no edema  Skin: no rashes or lesions   Resolved  Hospital Problem list     Assessment & Plan:   Severe sepsis secondary to postop rectal abscess vs UTI/Pyelo -Normal lactic acid -MAR and order history reviewed, it appears the patient has not received any IV fluids -Patchy enhancement of R kidney on CT suspicious for pyelo P: -Give 1 L LR now, then maintenance IV fluid -Wean pressors as able to maintain MAP > 65, if pressor needs are escalating patient may need central access -Obtain blood cultures x2, urine culture pending -Foley replaced in the ED -Broad-spectrum antibiotics, Vanco and cefepime -No echocardiogram in epic, will obtain -General surgery consult for possible small rectal abscess   Acute kidney injury -Creatinine 1.28 up from baseline <1.0 P: -IV fluid and follow repeat metabolic panel -Monitor I&O -Avoid nephrotoxic medication  Hypokalemia -K 3.2 P: -Replace with [id number] and repeat this morning -Check magnesium level   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 Pain/Anxiety/Delirium protocol (if indicated): None VAP protocol (if indicated): n/a DVT prophylaxis: heparin GI prophylaxis: none Glucose control: n/a Mobility: bed rest Code Status: full  Family Communication: pt stated no need to call family, he would update Disposition: ICU  Labs   CBC: Recent Labs  Lab 10/15/19 1624  WBC 12.0*  NEUTROABS 10.2*  HGB 9.6*  HCT 30.0*  MCV 94.6  PLT 123456    Basic Metabolic Panel: Recent Labs  Lab 10/15/19 1624  NA 140  K 3.2*  CL 103  CO2 27  GLUCOSE 133*  BUN 18  CREATININE 1.28*  CALCIUM 8.0*   GFR: Estimated Creatinine Clearance: 35.4 mL/min (A) (by C-G formula based on SCr of 1.28 mg/dL (H)). Recent Labs  Lab  10/15/19 1624  WBC 12.0*  LATICACIDVEN 1.4    Liver Function Tests: Recent Labs  Lab 10/15/19 1624  AST 36  ALT 19  ALKPHOS 119  BILITOT 0.4  PROT 6.0*  ALBUMIN 1.9*   No results for input(s): LIPASE, AMYLASE in the last 168 hours. No results for input(s): AMMONIA in the last 168 hours.  ABG No results found for: PHART, PCO2ART, PO2ART, HCO3, TCO2, ACIDBASEDEF, O2SAT   Coagulation Profile: No results for input(s): INR, PROTIME in the last 168 hours.  Cardiac Enzymes: No results for input(s): CKTOTAL, CKMB, CKMBINDEX, TROPONINI in the last 168 hours.  HbA1C: No results found for: HGBA1C  CBG: Recent Labs  Lab 10/16/19 0358  GLUCAP 130*    Review of Systems:   Negative except as noted in HPI  Past Medical History  He,  has a past medical history of COPD (chronic obstructive pulmonary disease) ([place]), Elevated PSA, radiation therapy (09/25/13- 11/27/13), Hyperlipidemia, Prostate cancer ([place]) (02/27/13), Prostatitis, and Vitamin D deficiency.   Surgical History    Past Surgical History:  Procedure Laterality Date  . COLOSTOMY  09/23/2019   Procedure: DIVERTING LOOP DESCENDING COLOSTOMY;  Surgeon: Coralie Keens, MD;  Location: [place] [place];  Service: General;;  .  LAPAROTOMY N/A 09/23/2019   Procedure: EXPLORATORY LAPAROTOMY;  Surgeon: Coralie Keens, MD;  Location: [place];  Service: General;  Laterality: N/A;  . PROSTATE BIOPSY  02/27/2013  . TONSILLECTOMY       Social History   reports that he has been smoking. He has a 60.00 pack-year smoking history. He has never used smokeless tobacco. He reports current alcohol use. He reports current drug use. Drug: Marijuana.   Family History   His family history includes Prostate cancer in an other family member.   Allergies Allergies  Allergen Reactions  . Penicillins     Did it involve swelling of the face/tongue/throat, SOB, or low BP? n/a Did it involve sudden or severe rash/hives, skin peeling, or any reaction on  the inside of your mouth or nose? n/a Did you need to seek medical attention at a hospital or doctor's office? n/a When did it last happen?[id number] If all above answers are "NO", may proceed with cephalosporin use.     Home Medications  Prior to Admission medications   Medication Sig Start Date End Date Taking? Authorizing Provider  Difluprednate (DUREZOL) 0.05 % EMUL Place 1 drop into the left eye daily.    [provider]  docusate sodium (COLACE) 100 MG capsule Take 1 capsule (100 mg total) by mouth daily for 14 days. 10/06/19 10/20/19  Cherylann Ratel A, DO  feeding supplement, ENSURE ENLIVE, (ENSURE ENLIVE) LIQD Take 237 mLs by mouth 3 (three) times daily between meals. 10/05/19   Cherylann Ratel A, DO  ferrous sulfate 325 (65 FE) MG tablet Take 325 mg by mouth 2 (two) times daily.    [provider]  magnesium oxide (MAG-OX) 400 (241.3 Mg) MG tablet Take 1 tablet (400 mg total) by mouth 2 (two) times daily for 14 days. 10/05/19 10/19/19  Cherylann Ratel A, DO  Multiple Vitamin (MULTIVITAMIN WITH MINERALS) TABS tablet Take 1 tablet by mouth daily. 10/06/19 11/05/19  Cherylann Ratel A, DO  polyethylene glycol (MIRALAX / GLYCOLAX) 17 g packet Take 17 g by mouth daily. 10/06/19   Cherylann Ratel A, DO  tamsulosin (FLOMAX) 0.4 MG CAPS capsule Take 1 capsule (0.4 mg total) by mouth daily. 10/06/19 12/05/19  Cherylann Ratel A, DO  vitamin C (ASCORBIC ACID) 500 MG tablet Take 500 mg by mouth 2 (two) times daily.    [provider]     Critical care time: 55 minutes     CRITICAL CARE Performed by: Otilio Carpen Alane Hanssen   Total critical care time: 55 minutes  Critical care time was exclusive of separately billable procedures and treating other patients.  Critical care was necessary to treat or prevent imminent or life-threatening deterioration.  Critical care was time spent personally by me on the following activities: development of treatment plan with patient and/or surrogate  as well as nursing, discussions with consultants, evaluation of patient's response to treatment, examination of patient, obtaining history from patient or surrogate, ordering and performing treatments and interventions, ordering and review of laboratory studies, ordering and review of radiographic studies, pulse oximetry and re-evaluation of patient's condition.  Otilio Carpen Murice Barbar, PA-C [facility]  Pager# [phone], if no answer [phone]

## 2019-10-16 NOTE — Progress Notes (Addendum)
Patient ID: Charles Espinoza, male   DOB: Mar 28, 1941, 78 y.o.   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  He underwent a diverting loop colostomy secondary to a LBO by Dr. Ninfa Linden on 09/23/19.  He was discharged to SNF apparently.  He returns overnight with sepsis.  He does not know why he was brought back to the hospital.  He states it was only because his "prostate bag" came loose.  He denies pain with urination.  He does not really remember why he was here in the hospital a couple of weeks ago, but then when asked if he remembers that he has cancer he states that yes he does.  He says he remembers seeing the oncologist.  When asked if he wanted to pursue treatment for his cancer he states "YES! I want to live.  Only a foul wouldn't want to treat this."  ADDENDUM: patient is clearly confused.  He is yelling at [place], and myself complaining about "laying in shit" and "y'all need to get your act together."  I explained that he just got here last night and he began yelling at me again saying "you don't know what you are talking about."  He thinks he has been here for weeks and that we aren't doing our job.  We try to reorient him and it only makes him more angry and mean.  ROS: See above, otherwise other systems negative  Objective: Vital signs in last 24 hours: Temp:  [97.7 F (36.5 C)-99.9 F (37.7 C)] 97.7 F (36.5 C) (10/28 0405) Pulse Rate:  [57-96] 67 (10/28 0600) Resp:  [16-31] 26 (10/28 0600) BP: (66-109)/(44-95) 97/57 (10/28 0600) SpO2:  [90 %-100 %] 100 % (10/28 0600) Weight:  [55.3 kg] 55.3 kg (10/28 0500) Last BM Date: 10/16/19  Intake/Output from previous day: 10/27 0701 - 10/28 0700 In: 473.6 [I.V.:38.3; IV Piggyback:435.3] Out: 125 [Urine:125] Intake/Output this shift: No intake/output data recorded.  PE: Heart: regular Lungs: CTAB Abd:  soft, NT, ND, +BS, ostomy with viable stoma and some stool in bag as well.  Midline wound is healing well.  Some packing in place, but wound has no depth to it and is almost healed  Lab Results:  Recent Labs    10/15/19 1624  WBC 12.0*  HGB 9.6*  HCT 30.0*  PLT 273   BMET Recent Labs    10/15/19 1624 10/16/19 0559  NA 140 141  K 3.2* 2.9*  CL 103 111  CO2 27 21*  GLUCOSE 133* 127*  BUN 18 15  CREATININE 1.28* 1.20  CALCIUM 8.0* 7.4*   PT/INR No results for input(s): LABPROT, INR in the last 72 hours. CMP     Component Value Date/Time   NA 141 10/16/2019 0559   NA 143 12/27/2012   K 2.9 (L) 10/16/2019 0559   CL 111 10/16/2019 0559   CO2 21 (L) 10/16/2019 0559   GLUCOSE 127 (H) 10/16/2019 0559   BUN 15 10/16/2019 0559   BUN 9 12/27/2012   CREATININE 1.20 10/16/2019 0559   CALCIUM 7.4 (L) 10/16/2019 0559   PROT 6.0 (L) 10/15/2019 1624   ALBUMIN 1.9 (L) 10/15/2019 1624   AST 36 10/15/2019 1624   ALT 19 10/15/2019 1624   ALKPHOS 119 10/15/2019 1624   BILITOT 0.4 10/15/2019 1624   GFRNONAA 57 (L) 10/16/2019 0559  GFRAA >60 10/16/2019 0559   Lipase     Component Value Date/Time   LIPASE 21 09/19/2019 1658       Studies/Results: Ct Head Wo Contrast  Result Date: 10/15/2019 CLINICAL DATA:  Altered level of consciousness. Hypotensive. Fever. No reported injury. EXAM: CT HEAD WITHOUT CONTRAST TECHNIQUE: Contiguous axial images were obtained from the base of the skull through the vertex without intravenous contrast. COMPARISON:  None. FINDINGS: Brain: No evidence of parenchymal hemorrhage or extra-axial fluid collection. No mass lesion, mass effect, or midline shift. No CT evidence of acute infarction. Generalized cerebral volume loss. Nonspecific mild subcortical and periventricular white matter hypodensity, most in keeping with chronic small vessel ischemic change. No ventriculomegaly. Vascular: No acute abnormality. Skull: No evidence of calvarial fracture.  Sinuses/Orbits: No fluid levels. Mild patchy opacification of the bilateral ethmoidal air cells. Other:  The mastoid air cells are unopacified. IMPRESSION: 1. No evidence of acute intracranial abnormality. No evidence of calvarial fracture. 2. Generalized cerebral volume loss and mild chronic small vessel ischemic changes in the cerebral white matter. 3. Mild paranasal sinusitis of uncertain chronicity. Electronically Signed   By: Ilona Sorrel M.D.   On: 10/15/2019 19:12   Ct Angio Chest Pe W And/or Wo Contrast  Result Date: 10/15/2019 CLINICAL DATA:  Pain and fevers EXAM: CT ANGIOGRAPHY CHEST CT ABDOMEN AND PELVIS WITH CONTRAST TECHNIQUE: Multidetector CT imaging of the chest was performed using the standard protocol during bolus administration of intravenous contrast. Multiplanar CT image reconstructions and MIPs were obtained to evaluate the vascular anatomy. Multidetector CT imaging of the abdomen and pelvis was performed using the standard protocol during bolus administration of intravenous contrast. CONTRAST:  75 mL Omnipaque 350 COMPARISON:  09/22/2019, 09/20/2019 FINDINGS: CTA CHEST FINDINGS Cardiovascular: Thoracic aorta and its branches demonstrate atherosclerotic calcifications. No aneurysmal dilatation or dissection is seen. No cardiac enlargement is noted. No significant coronary calcifications are seen. Pulmonary artery is well visualized without intraluminal filling defect to suggest pulmonary embolism. Mediastinum/Nodes: The thoracic inlet shows no focal abnormality. The known left thyroid lesion is not as well visualized due to scatter artifact and enhancement in the thyroid. No significant hilar or mediastinal adenopathy is noted. The esophagus as visualized is within normal limits. Lungs/Pleura: The right lung is well aerated without focal infiltrate or sizable effusion. The left lung demonstrates confluent density arising from the left suprahilar region extending superiorly into the upper  lobe. Occlusion of the upper lobe bronchus is noted with some indentation upon the pulmonary artery on the left. This may be related to metastatic disease with peripheral consolidation although the possibility of a primary lesion could not be totally excluded. This is stable from a prior CT examination from 09/20/2019. This area measures at least 6.8 x 4.3 cm. No other focal mass is noted. No sizable effusion is seen. Musculoskeletal: No chest wall abnormality. No acute or significant osseous findings. Review of the MIP images confirms the above findings. CT ABDOMEN and PELVIS FINDINGS Hepatobiliary: Liver demonstrates evidence of an 8.8 x 7.7 cm centrally necrotic mass lesion with peripheral enhancement consistent with metastatic disease. This corresponds to the recently biopsied liver lesion. No other focal lesion is identified within the liver. The gallbladder is unremarkable. Pancreas: Unremarkable. No pancreatic ductal dilatation or surrounding inflammatory changes. Spleen: Normal in size without focal abnormality. Adrenals/Urinary Tract: Adrenal glands are within normal limits. The kidneys demonstrate cystic change bilaterally. Additionally some patchy irregular enhancement of the right kidney is noted suspicious for pyelonephritis. The bladder is  decompressed by Foley catheter. Stomach/Bowel: There are changes consistent with a descending loop colostomy on the left consistent with the given clinical history. The large rectal mass is again identified measuring at least 6.3 cm in transverse diameter and extending for several cm along the course of the rectosigmoid. The previously seen fluid collections in the pelvis is not well visualized and likely represented some dilated colon proximal to the obstructing lesion. Normal decompressed colon is noted proximal to the rectosigmoid mass. No obstructive changes in the large or small bowel are noted. The stomach is within normal limits. There is however a small fluid  attenuation area adjacent to the rectal mass best seen on image number 71 of series 3. This measures 3 x 2 cm and is suspicious for a small postoperative abscess. Vascular/Lymphatic: Aortic atherosclerosis. No enlarged abdominal or pelvic lymph nodes. Reproductive: Prostate is unremarkable. Other: No abdominal wall hernia or abnormality. No abdominopelvic ascites. Musculoskeletal: No acute or significant osseous findings. Review of the MIP images confirms the above findings. IMPRESSION: CTA of the chest: No evidence of pulmonary emboli. Persistent mass lesion arising from the left hilum along the mediastinum in into the upper lobe is again seen stable from prior CT examination. Again this likely represents metastatic disease with peripheral consolidation although the possibility of a primary lesion deserves consideration. The known left thyroid lesion is not well appreciated on today's exam due to scatter artifact and underlying enhancement of the thyroid. CT of the abdomen and pelvis: Persistent large rectosigmoid mass lesion consistent with the given clinical history of rectal carcinoma. The previously seen fluid collection adjacent to this mass likely represented a dilated loop of colon as no significant abscess was noted on the intraoperative evaluation and no persistent fluid collection is noted on today's exam. There is however a small 3.0 x 2.0 cm fluid collection adjacent to the rectal mass best seen on image number 71 of series 3 suspicious for a small postoperative abscess. Patchy enhancement pattern particularly within the right kidney suspicious for pyelonephritis. Metastatic disease within the posterior aspect of the right lobe of the liver stable from previous studies. New left mid abdominal loop colostomy. Electronically Signed   By: Inez Catalina M.D.   On: 10/15/2019 19:30   Ct Abdomen Pelvis W Contrast  Result Date: 10/15/2019 CLINICAL DATA:  Pain and fevers EXAM: CT ANGIOGRAPHY CHEST CT  ABDOMEN AND PELVIS WITH CONTRAST TECHNIQUE: Multidetector CT imaging of the chest was performed using the standard protocol during bolus administration of intravenous contrast. Multiplanar CT image reconstructions and MIPs were obtained to evaluate the vascular anatomy. Multidetector CT imaging of the abdomen and pelvis was performed using the standard protocol during bolus administration of intravenous contrast. CONTRAST:  75 mL Omnipaque 350 COMPARISON:  09/22/2019, 09/20/2019 FINDINGS: CTA CHEST FINDINGS Cardiovascular: Thoracic aorta and its branches demonstrate atherosclerotic calcifications. No aneurysmal dilatation or dissection is seen. No cardiac enlargement is noted. No significant coronary calcifications are seen. Pulmonary artery is well visualized without intraluminal filling defect to suggest pulmonary embolism. Mediastinum/Nodes: The thoracic inlet shows no focal abnormality. The known left thyroid lesion is not as well visualized due to scatter artifact and enhancement in the thyroid. No significant hilar or mediastinal adenopathy is noted. The esophagus as visualized is within normal limits. Lungs/Pleura: The right lung is well aerated without focal infiltrate or sizable effusion. The left lung demonstrates confluent density arising from the left suprahilar region extending superiorly into the upper lobe. Occlusion of the upper lobe bronchus is  noted with some indentation upon the pulmonary artery on the left. This may be related to metastatic disease with peripheral consolidation although the possibility of a primary lesion could not be totally excluded. This is stable from a prior CT examination from 09/20/2019. This area measures at least 6.8 x 4.3 cm. No other focal mass is noted. No sizable effusion is seen. Musculoskeletal: No chest wall abnormality. No acute or significant osseous findings. Review of the MIP images confirms the above findings. CT ABDOMEN and PELVIS FINDINGS Hepatobiliary:  Liver demonstrates evidence of an 8.8 x 7.7 cm centrally necrotic mass lesion with peripheral enhancement consistent with metastatic disease. This corresponds to the recently biopsied liver lesion. No other focal lesion is identified within the liver. The gallbladder is unremarkable. Pancreas: Unremarkable. No pancreatic ductal dilatation or surrounding inflammatory changes. Spleen: Normal in size without focal abnormality. Adrenals/Urinary Tract: Adrenal glands are within normal limits. The kidneys demonstrate cystic change bilaterally. Additionally some patchy irregular enhancement of the right kidney is noted suspicious for pyelonephritis. The bladder is decompressed by Foley catheter. Stomach/Bowel: There are changes consistent with a descending loop colostomy on the left consistent with the given clinical history. The large rectal mass is again identified measuring at least 6.3 cm in transverse diameter and extending for several cm along the course of the rectosigmoid. The previously seen fluid collections in the pelvis is not well visualized and likely represented some dilated colon proximal to the obstructing lesion. Normal decompressed colon is noted proximal to the rectosigmoid mass. No obstructive changes in the large or small bowel are noted. The stomach is within normal limits. There is however a small fluid attenuation area adjacent to the rectal mass best seen on image number 71 of series 3. This measures 3 x 2 cm and is suspicious for a small postoperative abscess. Vascular/Lymphatic: Aortic atherosclerosis. No enlarged abdominal or pelvic lymph nodes. Reproductive: Prostate is unremarkable. Other: No abdominal wall hernia or abnormality. No abdominopelvic ascites. Musculoskeletal: No acute or significant osseous findings. Review of the MIP images confirms the above findings. IMPRESSION: CTA of the chest: No evidence of pulmonary emboli. Persistent mass lesion arising from the left hilum along the  mediastinum in into the upper lobe is again seen stable from prior CT examination. Again this likely represents metastatic disease with peripheral consolidation although the possibility of a primary lesion deserves consideration. The known left thyroid lesion is not well appreciated on today's exam due to scatter artifact and underlying enhancement of the thyroid. CT of the abdomen and pelvis: Persistent large rectosigmoid mass lesion consistent with the given clinical history of rectal carcinoma. The previously seen fluid collection adjacent to this mass likely represented a dilated loop of colon as no significant abscess was noted on the intraoperative evaluation and no persistent fluid collection is noted on today's exam. There is however a small 3.0 x 2.0 cm fluid collection adjacent to the rectal mass best seen on image number 71 of series 3 suspicious for a small postoperative abscess. Patchy enhancement pattern particularly within the right kidney suspicious for pyelonephritis. Metastatic disease within the posterior aspect of the right lobe of the liver stable from previous studies. New left mid abdominal loop colostomy. Electronically Signed   By: Inez Catalina M.D.   On: 10/15/2019 19:30   Dg Chest Portable 1 View  Result Date: 10/15/2019 CLINICAL DATA:  Fever EXAM: PORTABLE CHEST 1 VIEW COMPARISON:  September 27, 2015 FINDINGS: There is soft tissue fullness in the left hilar region  concerning for adenopathy. There is questionable associated infiltrate in this area. Lungs elsewhere are clear. Heart size is normal. Pulmonary vascularity appears normal. No appreciable bone lesions. IMPRESSION: Suspect left hilar adenopathy with questionable associated pneumonia. Given concern for neoplastic involvement in this area, contrast enhanced chest CT advised. Lungs elsewhere clear.  Heart size normal. Electronically Signed   By: Lowella Grip III M.D.   On: 10/15/2019 16:39     Anti-infectives: Anti-infectives (From admission, onward)   Start     Dose/Rate Route Frequency Ordered Stop   10/17/19 1800  vancomycin (VANCOCIN) 1,250 mg in sodium chloride 0.9 % 250 mL IVPB     1,250 mg 166.7 mL/hr over 90 Minutes Intravenous Every 48 hours 10/16/19 0456     10/16/19 0800  ceFEPIme (MAXIPIME) 2 g in sodium chloride 0.9 % 100 mL IVPB     2 g 200 mL/hr over 30 Minutes Intravenous Every 12 hours 10/16/19 0456     10/16/19 0800  metroNIDAZOLE (FLAGYL) IVPB 500 mg     500 mg 100 mL/hr over 60 Minutes Intravenous Every 8 hours 10/16/19 0646         Assessment/Plan Urosepsis - per medicine H/o prostate cancer COPD  Adenocarcinoma of colon, s/p diverting loop colostomy with small 2x3cm fluid collection near mass -this collection is likely too small to drain.  It is unlikely to be causing his source of sepsis given what his UA looks like.   -recommend abx therapy for this collection and nothing more at this point as he is diverted -he may eat from our standpoint -routine ostomy care -will place aquacel ag on wound for dressing changes every 1-2 days as his wound is almost completely healed    FEN - NPO, but may have a diet from our standpoint VTE - heparin ID - maxipime, flagyl, vanc   LOS: 0 days    Henreitta Cea , [facility] 10/16/2019, 7:44 AM Please see Amion for pager number during day hours 7:00am-4:30pm

## 2019-10-16 NOTE — Progress Notes (Signed)
Pharmacy Antibiotic Note  Charles Espinoza is a 78 y.o. male admitted on 10/16/2019 with fevers/hypotension, severe sepsis.  Pharmacy has been consulted for Vancomycin and Cefepime dosing.  Vancomycin 1500 mg IV given in ED at 2130    Plan: Vancomycin 1250 mg IV q48h Est AUC 479 Cefepime 2 gIV q12h     Temp (24hrs), Avg:99.2 F (37.3 C), Min:97.7 F (36.5 C), Max:99.9 F (37.7 C)  Recent Labs  Lab 10/15/19 1624  WBC 12.0*  CREATININE 1.28*  LATICACIDVEN 1.4    Estimated Creatinine Clearance: 35.4 mL/min (A) (by C-G formula based on SCr of 1.28 mg/dL (H)).    Allergies  Allergen Reactions  . Penicillins     Did it involve swelling of the face/tongue/throat, SOB, or low BP? n/a Did it involve sudden or severe rash/hives, skin peeling, or any reaction on the inside of your mouth or nose? n/a Did you need to seek medical attention at a hospital or doctor's office? n/a When did it last happen?[id number] If all above answers are "NO", may proceed with cephalosporin use.    Charles Espinoza 10/16/2019 4:50 AM

## 2019-10-16 NOTE — ED Notes (Signed)
Pt now in the care of [facility]. Pt verbalizes understanding of transfer to [facility].

## 2019-10-16 NOTE — ED Notes (Signed)
This RN gave report to [facility] and [facility] cabin crew gave report to Product manager at [facility].

## 2019-10-16 NOTE — Consult Note (Signed)
[facility] Nurse ostomy follow up Patient receiving care in [place]. Stoma type/location: LMQ colostomy Stomal assessment/size: 2 1/4 inches, round, red, producing brown stool Peristomal assessment: deferred, supplies had to be ordered. Treatment options for stomal/peristomal skin: barrier ring Output: brown stool  Ostomy pouching: 2pc. Flat with barrier ring Primary RN, Christine in room at the time of my assessment.  Rigid support rod was in place.  I removed it.  This was no longer needed and will facilitate pouching and maintaining a seal. WOC nurse will not follow at this time.  Please re-consult the [place] team if needed.  Val Riles, RN, MSN, CWOCN, CNS-BC, pager [phone]

## 2019-10-16 NOTE — ED Notes (Signed)
Pt unable to sign consent due to mental status. Pt is aware that he is going to [facility].

## 2019-10-17 ENCOUNTER — Institutional Professional Consult (permissible substitution): Payer: Medicare Other | Admitting: Pulmonary Disease

## 2019-10-17 DIAGNOSIS — C227 Other specified carcinomas of liver: Secondary | ICD-10-CM | POA: Diagnosis not present

## 2019-10-17 DIAGNOSIS — R918 Other nonspecific abnormal finding of lung field: Secondary | ICD-10-CM | POA: Diagnosis not present

## 2019-10-17 DIAGNOSIS — R6521 Severe sepsis with septic shock: Secondary | ICD-10-CM | POA: Diagnosis not present

## 2019-10-17 DIAGNOSIS — N390 Urinary tract infection, site not specified: Secondary | ICD-10-CM | POA: Diagnosis not present

## 2019-10-17 DIAGNOSIS — F17210 Nicotine dependence, cigarettes, uncomplicated: Secondary | ICD-10-CM

## 2019-10-17 DIAGNOSIS — R7881 Bacteremia: Secondary | ICD-10-CM | POA: Diagnosis not present

## 2019-10-17 DIAGNOSIS — A4181 Sepsis due to Enterococcus: Secondary | ICD-10-CM | POA: Diagnosis not present

## 2019-10-17 DIAGNOSIS — Z933 Colostomy status: Secondary | ICD-10-CM

## 2019-10-17 DIAGNOSIS — B952 Enterococcus as the cause of diseases classified elsewhere: Secondary | ICD-10-CM

## 2019-10-17 DIAGNOSIS — K629 Disease of anus and rectum, unspecified: Secondary | ICD-10-CM

## 2019-10-17 DIAGNOSIS — Z8546 Personal history of malignant neoplasm of prostate: Secondary | ICD-10-CM

## 2019-10-17 DIAGNOSIS — Z923 Personal history of irradiation: Secondary | ICD-10-CM

## 2019-10-17 LAB — CBC
HCT: 27.1 % — ABNORMAL LOW (ref 39.0–52.0)
Hemoglobin: 8.8 g/dL — ABNORMAL LOW (ref 13.0–17.0)
MCH: 30.7 pg (ref 26.0–34.0)
MCHC: 32.5 g/dL (ref 30.0–36.0)
MCV: 94.4 fL (ref 80.0–100.0)
Platelets: 243 10*3/uL (ref 150–400)
RBC: 2.87 MIL/uL — ABNORMAL LOW (ref 4.22–5.81)
RDW: 14.6 % (ref 11.5–15.5)
WBC: 9.8 10*3/uL (ref 4.0–10.5)
nRBC: 0 % (ref 0.0–0.2)

## 2019-10-17 LAB — MAGNESIUM: Magnesium: 1.6 mg/dL — ABNORMAL LOW (ref 1.7–2.4)

## 2019-10-17 LAB — BASIC METABOLIC PANEL
Anion gap: 6 (ref 5–15)
BUN: 15 mg/dL (ref 8–23)
CO2: 22 mmol/L (ref 22–32)
Calcium: 7.4 mg/dL — ABNORMAL LOW (ref 8.9–10.3)
Chloride: 109 mmol/L (ref 98–111)
Creatinine, Ser: 1.07 mg/dL (ref 0.61–1.24)
GFR calc Af Amer: >60 mL/min (ref >60)
GFR calc non Af Amer: >60 mL/min (ref >60)
Glucose, Bld: 137 mg/dL — ABNORMAL HIGH (ref 70–99)
Potassium: 3.9 mmol/L (ref 3.5–5.1)
Sodium: 137 mmol/L (ref 135–145)

## 2019-10-17 MED ORDER — SODIUM CHLORIDE 0.9 % IV SOLN
3.0000 g | Freq: Four times a day (QID) | INTRAVENOUS | Status: DC
  Administered 2019-10-17 – 2019-10-19 (×9): 3 g via INTRAVENOUS
  Filled 2019-10-17: qty 8
  Filled 2019-10-17: qty 3
  Filled 2019-10-17: qty 8
  Filled 2019-10-17 (×2): qty 3
  Filled 2019-10-17 (×2): qty 8
  Filled 2019-10-17: qty 3
  Filled 2019-10-17: qty 8
  Filled 2019-10-17: qty 3
  Filled 2019-10-17 (×2): qty 8

## 2019-10-17 MED ORDER — EPINEPHRINE 0.3 MG/0.3ML IJ SOAJ
0.3000 mg | Freq: Once | INTRAMUSCULAR | Status: DC | PRN
  Filled 2019-10-17: qty 0.6

## 2019-10-17 MED ORDER — MAGNESIUM SULFATE 2 GM/50ML IV SOLN
2.0000 g | Freq: Once | INTRAVENOUS | Status: AC
  Administered 2019-10-17: 2 g via INTRAVENOUS
  Filled 2019-10-17: qty 50

## 2019-10-17 MED ORDER — VANCOMYCIN HCL IN DEXTROSE 750-5 MG/150ML-% IV SOLN
750.0000 mg | INTRAVENOUS | Status: DC
  Administered 2019-10-17: 750 mg via INTRAVENOUS
  Filled 2019-10-17: qty 150

## 2019-10-17 MED ORDER — ENSURE ENLIVE PO LIQD
237.0000 mL | Freq: Four times a day (QID) | ORAL | Status: DC
  Administered 2019-10-17: 237 mL via ORAL

## 2019-10-17 MED ORDER — DIPHENHYDRAMINE HCL 50 MG/ML IJ SOLN: 25.0000 mg | Freq: Once | INTRAMUSCULAR | Status: DC | PRN

## 2019-10-17 MED ORDER — AMOXICILLIN 500 MG PO CAPS
500.0000 mg | ORAL_CAPSULE | Freq: Once | ORAL | Status: AC
  Administered 2019-10-17: 500 mg via ORAL
  Filled 2019-10-17: qty 1

## 2019-10-17 NOTE — Progress Notes (Signed)
NAME:  Charles Espinoza, MRN:  [id number], DOB:  [birth year], LOS: 1 ADMISSION DATE:  10/16/2019, CONSULTATION DATE:  10/17/19 REFERRING MD:  Dr. Alfred Levins, CHIEF COMPLAINT:  Sepsis    Brief History   78 year old male with recent sigmoid malignant ulceration with subsequent perforation and he underwent ex lap with diverting colostomy, history of prostate cancer and likely recently diagnosed metastatic colorectal cancer who presented to the emergency department with fever and hypotension and likely small rectal postoperative abscess.  History of present illness   Charles Espinoza is a 78 year old male with a history of prostate cancer treated with radiation in 2014-2015 who was recently admitted earlier this month for abdominal pain and found to have complex sigmoid collection.  He underwent an exlap with diverting colostomy on 09/23/2019.  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  He was referred to oncology outpatient and discharged to rehab.  Patient is not sure when his last colonoscopy was.  Yesterday, patient apparently pulled out his Foley catheter in his sleep, it appears that EMS was called for this and found patient to be febrile and hypotensive so he was taken to the emergency department.  In the ED, patient's blood pressure was as low as 66/44, temp 102F.  Labs show leukocytosis of 12.0, creatinine 1.28, K3.2 and normal lactic acid, UA with bacteria and leukocytes.  CT head was without acute findings, CTA chest showed no PE but left lung density likely related to metastatic disease, CT abdomen pelvis with postoperative changes but noted small 3.0 x 2.0 cm fluid collection adjacent to rectal mass suspicious for postoperative abscess.  He was started on Levophed and PCCM consulted for admission.  At the time of admission, patient states that he has been feeling well with no complaints.  He did  not notice any abdominal or rectal pain, no myalgias.  Palliative care was consulted during last admission and patient and family desire aggressive treatment plan and he is full code.  Past Medical History   Past Medical History:  Diagnosis Date  . Colon cancer ([place])   . COPD (chronic obstructive pulmonary disease) ([place])   . Elevated PSA   . Hx of radiation therapy 09/25/13- 11/27/13   prostate 7800 cGy 39 sessions  . Hyperlipidemia   . Prostate cancer ([place]) 02/27/13   gleason 6, volume 133 cc  . Prostatitis   . Sigmoid volvulus ([place])    s/p ex-lap  . Vitamin D deficiency      Significant Hospital Events   10/28 Admit to PCCM  Consults:  Surgery  Procedures:    Significant Diagnostic Tests:  CT head>>no acute findings CT Chest>> L Lung demonstrates confluent density arising from the left suprahilar region extending superiorly into the upper lobe CT abd/pelvis>>Liver demonstrates evidence of an 8.8 x 7.7 cm centrally necrotic mass lesion, small fluid attenuation area adjacent to the rectal mass, 3 x 2 cm and is suspicious for a small postoperative abscess, Patchy enhancement pattern particularly within the right kidney suspicious for pyelonephritis. ECHO 10/28> LVEF 60-65%, normal RV systolic function    Micro Data:  10/28 BCx2>> 10/28 UC>> 10/27 Sars-CoV-2>>neg 10/27 C diff antigen positive, C.diff toxin negative   Antimicrobials:  Vancomycin 10/28- Cefepime 10/28- Flagyl 10/28-   Interim history/subjective:  Remain on NE Patient is agitated stating "no doctor has seen me this entire time I am fed up with all these lady practitioners get the  damn doctor."  We discussed the physicians who have been involved in the patient's case as well as the role of APPs. Patient is insistent that no physician has seen the patient and demands that no APP be involved in his care.   Objective   Blood pressure 91/64, pulse 77, temperature 98 F (36.7 C), temperature source Oral,  resp. rate (!) 24, weight 57.9 kg, SpO2 100 %. CVP:  [3 mmHg-5 mmHg] 4 mmHg      Intake/Output Summary (Last 24 hours) at 10/17/2019 0943 Last data filed at 10/17/2019 0900 Gross per 24 hour  Intake 3562.55 ml  Output 1690 ml  Net 1872.55 ml   Filed Weights   10/16/19 0500 10/17/19 0339  Weight: 55.3 kg 57.9 kg    General: Chronically ill appearing, older adult M in NAD  HEENT: NCAT pink mmm, trachea mindline Neuro: Awake, alert, Oriented x3 however with poor memory CV: RRR s1s2 no rgm.  PULM: CTA bilaterally, no accessory muscle use on RA, shallow respirations, symmetrical chest expansion  GI: Soft, round, Ostomy with feculent output. + bowel sounds  Extremities: No cyanosis or clubbing Skin: c/d/w/i without rash   Resolved Hospital Problem list     Assessment & Plan:   Shock -Septic shock -UTI/pyelo, small post-op abscess -C.Diff antigen positive, toxin negative  -Normal lactic acid -Patchy enhancement of R kidney on CT suspicious for pyelo -Foley replaced in ED -possible component of hypovolemia -MAR and order history reviewed, it appears the patient has not received any IV fluids -ECHO 10/28 LVEF 60-65%. Normal RV systolic function. IVC with > 50% respiratory variability  P: -MAP goal > 65. NE for goal  -Continue abx, narrow as able pending micro results -Trend WBC, fever curve  -Surgery has seen-- fluid collection is not large enough for drain; cont abx   Adenocarcinoma of colon s/p diverting loop colostomy -small 2cm x 3cm fluid collection near mass -primary colorectal cancer with metastases to the liver, lung, rectum -Pathology resulted from liver biopsy earlier this month -Last DC summary notes patient has been referred to oncology outpatient P: Surgery has evaluated /followed patient, is now signed off.  Wound recs per surgery note  Fluid collection not large enough for drain; Continue abx therapy Per Surgery, ok for PO intake from their perspective   Follow up with onc outpatient  Acute kidney injury, improved  -Creatinine 1.28 up from baseline <1.0 P: -Trend renal indices and electrolytes -trend UOP  Hypokalemia, improved Hypomagnesemia P: -AM BMP, mag, phos -Check ionized calcium  -Continue mag ox BID  -Additional electrolyte correction PRN  Best practice:  Diet: thin liquid Pain/Anxiety/Delirium protocol (if indicated): oxycodone PRN VAP protocol (if indicated): n/a DVT prophylaxis: subq heparin GI prophylaxis: none Glucose control: n/a Mobility: bed rest Code Status: full  Family Communication: pending 10/29 Disposition: Remains in ICU to due vasoactive medications   Labs   CBC: Recent Labs  Lab 10/15/19 1624 10/16/19 0559 10/17/19 0344  WBC 12.0* 15.7* 9.8  NEUTROABS 10.2*  --   --   HGB 9.6* 9.7* 8.8*  HCT 30.0* 30.5* 27.1*  MCV 94.6 96.2 94.4  PLT 273 266 0000000    Basic Metabolic Panel: Recent Labs  Lab 10/15/19 1624 10/16/19 0559 10/16/19 1429 10/17/19 0344  NA 140 141 139 137  K 3.2* 2.9* 3.1* 3.9  CL 103 111 108 109  CO2 27 21* 23 22  GLUCOSE 133* 127* 163* 137*  BUN 18 15 16 15   CREATININE 1.28* 1.20 1.05 1.07  CALCIUM 8.0* 7.4* 7.2* 7.4*  MG  --  1.8  --  1.6*  PHOS  --  4.0  --   --    GFR: Estimated Creatinine Clearance: 45.1 mL/min (by C-G formula based on SCr of 1.07 mg/dL). Recent Labs  Lab 10/15/19 1624 10/16/19 0559 10/17/19 0344  WBC 12.0* 15.7* 9.8  LATICACIDVEN 1.4  --   --     Liver Function Tests: Recent Labs  Lab 10/15/19 1624  AST 36  ALT 19  ALKPHOS 119  BILITOT 0.4  PROT 6.0*  ALBUMIN 1.9*   No results for input(s): LIPASE, AMYLASE in the last 168 hours. No results for input(s): AMMONIA in the last 168 hours.  ABG No results found for: PHART, PCO2ART, PO2ART, HCO3, TCO2, ACIDBASEDEF, O2SAT   Coagulation Profile: No results for input(s): INR, PROTIME in the last 168 hours.  Cardiac Enzymes: No results for input(s): CKTOTAL, CKMB, CKMBINDEX,  TROPONINI in the last 168 hours.  HbA1C: No results found for: HGBA1C  CBG: Recent Labs  Lab 10/16/19 0358 10/16/19 0753  GLUCAP 130* 94     Critical care time: 35 minutes     CRITICAL CARE Performed by: Cristal Generous   Total critical care time: 35 minutes  Critical care time was exclusive of separately billable procedures and treating other patients.  Critical care was necessary to treat or prevent imminent or life-threatening deterioration.  Critical care was time spent personally by me on the following activities: development of treatment plan with patient and/or surrogate as well as nursing, discussions with consultants, evaluation of patient's response to treatment, examination of patient, obtaining history from patient or surrogate, ordering and performing treatments and interventions, ordering and review of laboratory studies, ordering and review of radiographic studies, pulse oximetry and re-evaluation of patient's condition.

## 2019-10-17 NOTE — Consult Note (Signed)
[facility]  Total days of antibiotics 2(vanco/cefepime/metro)       Reason for Consult:enterococcal bacteremia   Referring Physician: ellison  Active Problems:   Sepsis ([place])    HPI: Charles Espinoza is a 78 y.o. 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 From this admission, he was found to have large lung metastatic lesions, liver mass and rectal mass. 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 He was discharged on 10/17 - to SNF with foley catheter.   On 10/29 he was brought back to the ED after pulling out catheter with mild penile pain, found to have fever of 1007 as well as hypotension with Sbp in 60-80s. WBC of 15K, LA 1.4. he was fluid rescucitated and started on broad spectrum abtx. He did undergo repeat imaging that showed possible new fluid collection next to rectal mass. His infectious work up showed + e.faecalis in blood cx as well as urine cx+ efaecalis plus ecoli. He is feeling improved.  Imaging showed:  Chest ct: left lung demonstrates confluent density arising from the left suprahilar region extending superiorly into the upper lobe. Occlusion of the upper lobe bronchus is noted with some indentation upon the pulmonary artery on the left. This may be related to metastatic disease This is stable from a prior CT examination from10/01/2019. This area measures at least 6.8 x 4.3 cm.  Liver:Liver demonstrates evidence of an 8.8 x 7.7 cm centrally necrotic mass lesion with peripheral enhancement consistent with metastatic disease.   The large rectal mass is again  identified measuring at least 6.3 cm in transverse diameter and extending for several cm along the course of the rectosigmoid with small fluid attenuation area adjacent to the rectal mass. This measures 3 x 2 cm and is suspicious for a small postoperative abscess. 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 n/a Did it involve sudden or severe rash/hives, skin peeling, or any reaction on the inside of your mouth or nose? n/a Did you need to seek medical attention at a hospital or doctor's office? n/a When did it last happen?year1972 If all above answers are NO, may proceed with cephalosporin use.     MEDICATIONS:  Chlorhexidine Gluconate Cloth  6 each Topical Daily   Difluprednate  1 drop Left Eye Daily   docusate sodium  100 mg Oral Daily   feeding supplement (ENSURE ENLIVE)  237 mL Oral QID   heparin  5,000 Units Subcutaneous Q8H   magnesium oxide  400 mg Oral BID   multivitamin with minerals  1 tablet Oral Daily  tamsulosin  0.4 mg Oral Daily    Social History   Tobacco Use   Smoking status: Current Every Day Smoker    Packs/day: 1.00    Years: 60.00    Pack years: 60.00   Smokeless tobacco: Never Used  Substance Use Topics   Alcohol use: Yes    Frequency: Never    Comment: Social   Drug use: Yes    Types: Marijuana    Comment: Daily x50 years    Family History  Problem Relation Age of Onset   Prostate cancer Other     Review of Systems -  12 point ros reviewed in hpi. + fever,  chills, now improved. Painful urination since removal of catheter.denies abdominal pain. Healing well.   OBJECTIVE: Temp:  [97.3 F (36.3 C)-99.8 F (37.7 C)] 98 F (36.7 C) (10/29 0807) Pulse Rate:  [69-83] 77 (10/29 0300) Resp:  [9-29] 24 (10/29 0900) BP: (78-111)/(54-88) 91/64 (10/29 0900) SpO2:  [88 %-100 %] 100 % (10/29 0400) Weight:  [57.9 kg] 57.9 kg (10/29 0339) .Physical Exam  Constitutional: He is oriented to person, place, and time. He appears well-developed and well-nourished. No distress. Watching tennis on tv HENT:  Mouth/Throat: Oropharynx is clear and moist. No oropharyngeal exudate.  Cardiovascular: Normal rate, regular rhythm and normal heart sounds. Exam reveals no gallop and no friction rub.  No murmur heard.  Pulmonary/Chest: Effort normal and breath sounds normal. No respiratory distress. He has no wheezes.  Abdominal: Soft. Bowel sounds are present. Abdominal incision is c/d/i. Left lower quadrant has liquid stool Lymphadenopathy:  no cervical adenopathy.  Neurological: He is alert and oriented to person, place, and time.  Skin: Skin is warm and dry. No rash noted. No erythema.  Psychiatric: He has a normal mood and affect. His behavior is normal.    LABS: Results for orders placed or performed during the hospital encounter of 10/16/19 (from the past 48 hour(s))  MRSA PCR Screening     Status: None   Collection Time: 10/16/19  3:25 AM   Specimen: Nasal Mucosa; Nasopharyngeal  Result Value Ref Range   MRSA by PCR NEGATIVE NEGATIVE    Comment:        The GeneXpert MRSA Assay (FDA approved for NASAL specimens only), is one component of a comprehensive MRSA colonization surveillance program. It is not intended to diagnose MRSA infection nor to guide or monitor treatment for MRSA infections. Performed at [facility], [place] [address] [place], [place] [address]   Glucose, capillary     Status: Abnormal   Collection Time: 10/16/19  3:58 AM  Result  Value Ref Range   Glucose-Capillary 130 (H) 70 - 99 mg/dL  Culture, blood (routine x 2)     Status: None (Preliminary result)   Collection Time: 10/16/19  5:55 AM   Specimen: BLOOD RIGHT ARM  Result Value Ref Range   Specimen Description BLOOD RIGHT ARM    Special Requests      BOTTLES DRAWN AEROBIC AND ANAEROBIC Blood Culture results may not be optimal due to an inadequate volume of blood received in culture bottles   Culture      NO GROWTH 1 DAY Performed at [facility], [place] [address] [place], [place] [address]    Report Status PENDING   Culture, blood (routine x 2)     Status: None (Preliminary result)   Collection Time: 10/16/19  5:57 AM   Specimen: BLOOD LEFT ARM  Result Value Ref Range  Specimen Description BLOOD LEFT ARM    Special Requests      BOTTLES DRAWN AEROBIC AND ANAEROBIC Blood Culture results may not be optimal due to an inadequate volume of blood received in culture bottles   Culture      NO GROWTH 1 DAY Performed at [facility] [address], [place], [place] [address]    Report Status PENDING   CBC     Status: Abnormal   Collection Time: 10/16/19  5:59 AM  Result Value Ref Range   WBC 15.7 (H) 4.0 - 10.5 K/uL   RBC 3.17 (L) 4.22 - 5.81 MIL/uL   Hemoglobin 9.7 (L) 13.0 - 17.0 g/dL   HCT 30.5 (L) 39.0 - 52.0 %   MCV 96.2 80.0 - 100.0 fL   MCH 30.6 26.0 - 34.0 pg   MCHC 31.8 30.0 - 36.0 g/dL   RDW 14.6 11.5 - 15.5 %   Platelets 266 150 - 400 K/uL   nRBC 0.0 0.0 - 0.2 %    Comment: Performed at [facility], [place] [address], [place], [place] [id number]  Basic metabolic panel     Status: Abnormal   Collection Time: 10/16/19  5:59 AM  Result Value Ref Range   Sodium 141 135 - 145 mmol/L   Potassium 2.9 (L) 3.5 - 5.1 mmol/L   Chloride 111 98 - 111 mmol/L   CO2 21 (L) 22 - 32 mmol/L   Glucose, Bld 127 (H) 70 - 99 mg/dL   BUN 15 8 - 23 mg/dL   Creatinine, Ser 1.20 0.61 - 1.24 mg/dL   Calcium 7.4 (L) 8.9 - 10.3 mg/dL   GFR calc non  Af Amer 57 (L) >60 mL/min   GFR calc Af Amer >60 >60 mL/min   Anion gap 9 5 - 15    Comment: Performed at [facility] [address], [place], [place] [address]  Magnesium     Status: None   Collection Time: 10/16/19  5:59 AM  Result Value Ref Range   Magnesium 1.8 1.7 - 2.4 mg/dL    Comment: Performed at [facility] [address] [place], [place] [address]  Phosphorus     Status: None   Collection Time: 10/16/19  5:59 AM  Result Value Ref Range   Phosphorus 4.0 2.5 - 4.6 mg/dL    Comment: Performed at [facility] [address], [place], [place] [address]  Glucose, capillary     Status: None   Collection Time: 10/16/19  7:53 AM  Result Value Ref Range   Glucose-Capillary 94 70 - 99 mg/dL  Basic metabolic panel     Status: Abnormal   Collection Time: 10/16/19  2:29 PM  Result Value Ref Range   Sodium 139 135 - 145 mmol/L   Potassium 3.1 (L) 3.5 - 5.1 mmol/L   Chloride 108 98 - 111 mmol/L   CO2 23 22 - 32 mmol/L   Glucose, Bld 163 (H) 70 - 99 mg/dL   BUN 16 8 - 23 mg/dL   Creatinine, Ser 1.05 0.61 - 1.24 mg/dL   Calcium 7.2 (L) 8.9 - 10.3 mg/dL   GFR calc non Af Amer >60 >60 mL/min   GFR calc Af Amer >60 >60 mL/min   Anion gap 8 5 - 15    Comment: Performed at [facility], [place] [address] [place], [place] [address]  Magnesium     Status: Abnormal   Collection  Time: 10/17/19  3:44 AM  Result Value Ref Range   Magnesium 1.6 (L) 1.7 - 2.4 mg/dL    Comment: Performed at [facility] [address], [place], [place] [id number]  Basic metabolic panel     Status: Abnormal   Collection Time: 10/17/19  3:44 AM  Result Value Ref Range   Sodium 137 135 - 145 mmol/L   Potassium 3.9 3.5 - 5.1 mmol/L   Chloride 109 98 - 111 mmol/L   CO2 22 22 - 32 mmol/L   Glucose, Bld 137 (H) 70 - 99 mg/dL   BUN 15 8 - 23 mg/dL   Creatinine, Ser 1.07 0.61 - 1.24 mg/dL   Calcium 7.4 (L) 8.9 - 10.3 mg/dL   GFR calc non Af Amer >60 >60 mL/min   GFR calc Af Amer  >60 >60 mL/min   Anion gap 6 5 - 15    Comment: Performed at [facility], [place] [address], [place] [address]  CBC     Status: Abnormal   Collection Time: 10/17/19  3:44 AM  Result Value Ref Range   WBC 9.8 4.0 - 10.5 K/uL   RBC 2.87 (L) 4.22 - 5.81 MIL/uL   Hemoglobin 8.8 (L) 13.0 - 17.0 g/dL   HCT 27.1 (L) 39.0 - 52.0 %   MCV 94.4 80.0 - 100.0 fL   MCH 30.7 26.0 - 34.0 pg   MCHC 32.5 30.0 - 36.0 g/dL   RDW 14.6 11.5 - 15.5 %   Platelets 243 150 - 400 K/uL   nRBC 0.0 0.0 - 0.2 %    Comment: Performed at [facility], [place] [address], [place], [place] [address]    MICRO: efaecalis in 1 of 2 sets Urine cx showing 80K efaealis and 70k GNR IMAGING: Ct Head Wo Contrast  Result Date: 10/15/2019 CLINICAL DATA:  Altered level of consciousness. Hypotensive. Fever. No reported injury. EXAM: CT HEAD WITHOUT CONTRAST TECHNIQUE: Contiguous axial images were obtained from the base of the skull through the vertex without intravenous contrast. COMPARISON:  None. FINDINGS: Brain: No evidence of parenchymal hemorrhage or extra-axial fluid collection. No mass lesion, mass effect, or midline shift. No CT evidence of acute infarction. Generalized cerebral volume loss. Nonspecific mild subcortical and periventricular white matter hypodensity, most in keeping with chronic small vessel ischemic change. No ventriculomegaly. Vascular: No acute abnormality. Skull: No evidence of calvarial fracture. Sinuses/Orbits: No fluid levels. Mild patchy opacification of the bilateral ethmoidal air cells. Other:  The mastoid air cells are unopacified. IMPRESSION: 1. No evidence of acute intracranial abnormality. No evidence of calvarial fracture. 2. Generalized cerebral volume loss and mild chronic small vessel ischemic changes in the cerebral white matter. 3. Mild paranasal sinusitis of uncertain chronicity. Electronically Signed   By: Ilona Sorrel M.D.   On: 10/15/2019 19:12   Ct Angio Chest Pe W And/or  Wo Contrast  Result Date: 10/15/2019 CLINICAL DATA:  Pain and fevers EXAM: CT ANGIOGRAPHY CHEST CT ABDOMEN AND PELVIS WITH CONTRAST TECHNIQUE: Multidetector CT imaging of the chest was performed using the standard protocol during bolus administration of intravenous contrast. Multiplanar CT image reconstructions and MIPs were obtained to evaluate the vascular anatomy. Multidetector CT imaging of the abdomen and pelvis was performed using the standard protocol during bolus administration of intravenous contrast. CONTRAST:  75 mL Omnipaque 350 COMPARISON:  09/22/2019, 09/20/2019 FINDINGS: CTA CHEST FINDINGS Cardiovascular: Thoracic aorta and its branches demonstrate atherosclerotic calcifications. No aneurysmal dilatation or dissection is  seen. No cardiac enlargement is noted. No significant coronary calcifications are seen. Pulmonary artery is well visualized without intraluminal filling defect to suggest pulmonary embolism. Mediastinum/Nodes: The thoracic inlet shows no focal abnormality. The known left thyroid lesion is not as well visualized due to scatter artifact and enhancement in the thyroid. No significant hilar or mediastinal adenopathy is noted. The esophagus as visualized is within normal limits. Lungs/Pleura: The right lung is well aerated without focal infiltrate or sizable effusion. The left lung demonstrates confluent density arising from the left suprahilar region extending superiorly into the upper lobe. Occlusion of the upper lobe bronchus is noted with some indentation upon the pulmonary artery on the left. This may be related to metastatic disease with peripheral consolidation although the possibility of a primary lesion could not be totally excluded. This is stable from a prior CT examination from 09/20/2019. This area measures at least 6.8 x 4.3 cm. No other focal mass is noted. No sizable effusion is seen. Musculoskeletal: No chest wall abnormality. No acute or significant osseous findings.  Review of the MIP images confirms the above findings. CT ABDOMEN and PELVIS FINDINGS Hepatobiliary: Liver demonstrates evidence of an 8.8 x 7.7 cm centrally necrotic mass lesion with peripheral enhancement consistent with metastatic disease. This corresponds to the recently biopsied liver lesion. No other focal lesion is identified within the liver. The gallbladder is unremarkable. Pancreas: Unremarkable. No pancreatic ductal dilatation or surrounding inflammatory changes. Spleen: Normal in size without focal abnormality. Adrenals/Urinary Tract: Adrenal glands are within normal limits. The kidneys demonstrate cystic change bilaterally. Additionally some patchy irregular enhancement of the right kidney is noted suspicious for pyelonephritis. The bladder is decompressed by Foley catheter. Stomach/Bowel: There are changes consistent with a descending loop colostomy on the left consistent with the given clinical history. The large rectal mass is again identified measuring at least 6.3 cm in transverse diameter and extending for several cm along the course of the rectosigmoid. The previously seen fluid collections in the pelvis is not well visualized and likely represented some dilated colon proximal to the obstructing lesion. Normal decompressed colon is noted proximal to the rectosigmoid mass. No obstructive changes in the large or small bowel are noted. The stomach is within normal limits. There is however a small fluid attenuation area adjacent to the rectal mass best seen on image number 71 of series 3. This measures 3 x 2 cm and is suspicious for a small postoperative abscess. Vascular/Lymphatic: Aortic atherosclerosis. No enlarged abdominal or pelvic lymph nodes. Reproductive: Prostate is unremarkable. Other: No abdominal wall hernia or abnormality. No abdominopelvic ascites. Musculoskeletal: No acute or significant osseous findings. Review of the MIP images confirms the above findings. IMPRESSION: CTA of the  chest: No evidence of pulmonary emboli. Persistent mass lesion arising from the left hilum along the mediastinum in into the upper lobe is again seen stable from prior CT examination. Again this likely represents metastatic disease with peripheral consolidation although the possibility of a primary lesion deserves consideration. The known left thyroid lesion is not well appreciated on today's exam due to scatter artifact and underlying enhancement of the thyroid. CT of the abdomen and pelvis: Persistent large rectosigmoid mass lesion consistent with the given clinical history of rectal carcinoma. The previously seen fluid collection adjacent to this mass likely represented a dilated loop of colon as no significant abscess was noted on the intraoperative evaluation and no persistent fluid collection is noted on today's exam. There is however a small 3.0 x 2.0 cm  fluid collection adjacent to the rectal mass best seen on image number 71 of series 3 suspicious for a small postoperative abscess. Patchy enhancement pattern particularly within the right kidney suspicious for pyelonephritis. Metastatic disease within the posterior aspect of the right lobe of the liver stable from previous studies. New left mid abdominal loop colostomy. Electronically Signed   By: Inez Catalina M.D.   On: 10/15/2019 19:30   Ct Abdomen Pelvis W Contrast  Result Date: 10/15/2019 CLINICAL DATA:  Pain and fevers EXAM: CT ANGIOGRAPHY CHEST CT ABDOMEN AND PELVIS WITH CONTRAST TECHNIQUE: Multidetector CT imaging of the chest was performed using the standard protocol during bolus administration of intravenous contrast. Multiplanar CT image reconstructions and MIPs were obtained to evaluate the vascular anatomy. Multidetector CT imaging of the abdomen and pelvis was performed using the standard protocol during bolus administration of intravenous contrast. CONTRAST:  75 mL Omnipaque 350 COMPARISON:  09/22/2019, 09/20/2019 FINDINGS: CTA CHEST  FINDINGS Cardiovascular: Thoracic aorta and its branches demonstrate atherosclerotic calcifications. No aneurysmal dilatation or dissection is seen. No cardiac enlargement is noted. No significant coronary calcifications are seen. Pulmonary artery is well visualized without intraluminal filling defect to suggest pulmonary embolism. Mediastinum/Nodes: The thoracic inlet shows no focal abnormality. The known left thyroid lesion is not as well visualized due to scatter artifact and enhancement in the thyroid. No significant hilar or mediastinal adenopathy is noted. The esophagus as visualized is within normal limits. Lungs/Pleura: The right lung is well aerated without focal infiltrate or sizable effusion. The left lung demonstrates confluent density arising from the left suprahilar region extending superiorly into the upper lobe. Occlusion of the upper lobe bronchus is noted with some indentation upon the pulmonary artery on the left. This may be related to metastatic disease with peripheral consolidation although the possibility of a primary lesion could not be totally excluded. This is stable from a prior CT examination from 09/20/2019. This area measures at least 6.8 x 4.3 cm. No other focal mass is noted. No sizable effusion is seen. Musculoskeletal: No chest wall abnormality. No acute or significant osseous findings. Review of the MIP images confirms the above findings. CT ABDOMEN and PELVIS FINDINGS Hepatobiliary: Liver demonstrates evidence of an 8.8 x 7.7 cm centrally necrotic mass lesion with peripheral enhancement consistent with metastatic disease. This corresponds to the recently biopsied liver lesion. No other focal lesion is identified within the liver. The gallbladder is unremarkable. Pancreas: Unremarkable. No pancreatic ductal dilatation or surrounding inflammatory changes. Spleen: Normal in size without focal abnormality. Adrenals/Urinary Tract: Adrenal glands are within normal limits. The kidneys  demonstrate cystic change bilaterally. Additionally some patchy irregular enhancement of the right kidney is noted suspicious for pyelonephritis. The bladder is decompressed by Foley catheter. Stomach/Bowel: There are changes consistent with a descending loop colostomy on the left consistent with the given clinical history. The large rectal mass is again identified measuring at least 6.3 cm in transverse diameter and extending for several cm along the course of the rectosigmoid. The previously seen fluid collections in the pelvis is not well visualized and likely represented some dilated colon proximal to the obstructing lesion. Normal decompressed colon is noted proximal to the rectosigmoid mass. No obstructive changes in the large or small bowel are noted. The stomach is within normal limits. There is however a small fluid attenuation area adjacent to the rectal mass best seen on image number 71 of series 3. This measures 3 x 2 cm and is suspicious for a small postoperative abscess. Vascular/Lymphatic:  Aortic atherosclerosis. No enlarged abdominal or pelvic lymph nodes. Reproductive: Prostate is unremarkable. Other: No abdominal wall hernia or abnormality. No abdominopelvic ascites. Musculoskeletal: No acute or significant osseous findings. Review of the MIP images confirms the above findings. IMPRESSION: CTA of the chest: No evidence of pulmonary emboli. Persistent mass lesion arising from the left hilum along the mediastinum in into the upper lobe is again seen stable from prior CT examination. Again this likely represents metastatic disease with peripheral consolidation although the possibility of a primary lesion deserves consideration. The known left thyroid lesion is not well appreciated on today's exam due to scatter artifact and underlying enhancement of the thyroid. CT of the abdomen and pelvis: Persistent large rectosigmoid mass lesion consistent with the given clinical history of rectal carcinoma. The  previously seen fluid collection adjacent to this mass likely represented a dilated loop of colon as no significant abscess was noted on the intraoperative evaluation and no persistent fluid collection is noted on today's exam. There is however a small 3.0 x 2.0 cm fluid collection adjacent to the rectal mass best seen on image number 71 of series 3 suspicious for a small postoperative abscess. Patchy enhancement pattern particularly within the right kidney suspicious for pyelonephritis. Metastatic disease within the posterior aspect of the right lobe of the liver stable from previous studies. New left mid abdominal loop colostomy. Electronically Signed   By: Inez Catalina M.D.   On: 10/15/2019 19:30   Dg Chest Port 1 View  Result Date: 10/16/2019 CLINICAL DATA:  Central line insertion EXAM: PORTABLE CHEST 1 VIEW COMPARISON:  October 15, 2019 FINDINGS: Interval placement of left IJ central line with tip overlying the central SVC. No pneumothorax. Stable left perihilar density better evaluated on recent CT. There is mild elevation of the left hemidiaphragm. No pleural effusion. Stable cardiomediastinal contours. IMPRESSION: New left IJ central line tip overlying the SVC.  No pneumothorax. Electronically Signed   By: Macy Mis M.D.   On: 10/16/2019 12:48   Dg Chest Portable 1 View  Result Date: 10/15/2019 CLINICAL DATA:  Fever EXAM: PORTABLE CHEST 1 VIEW COMPARISON:  September 27, 2015 FINDINGS: There is soft tissue fullness in the left hilar region concerning for adenopathy. There is questionable associated infiltrate in this area. Lungs elsewhere are clear. Heart size is normal. Pulmonary vascularity appears normal. No appreciable bone lesions. IMPRESSION: Suspect left hilar adenopathy with questionable associated pneumonia. Given concern for neoplastic involvement in this area, contrast enhanced chest CT advised. Lungs elsewhere clear.  Heart size normal. Electronically Signed   By: Lowella Grip III  M.D.   On: 10/15/2019 16:39     Assessment/Plan:  78yo M with metastatic adenoca admitted with suspected sepsis of urinary source  with e.faecalis bacteremia  - will challenge with amoxicillin to see tolerates penicillin class - which he has today. We will remove PCN allergy from chart since he has outgrown allergy that he listed from 45yrs ago - plan to switch to amp/sub to cover bacteremia and possible intra-abd abscess - recommend to get tee to evaluate for endocarditis  - repeat blood cx pending  Caren Griffins B. [facility] [phone]

## 2019-10-17 NOTE — Progress Notes (Addendum)
Patient ID: Charles Espinoza, male   DOB: Sep 19, 1941, 78 y.o.   MRN: [id number]       Subjective: Patient less confused today, but doesn't remember Dr. Dema Severin or I visiting yesterday and expresses frustration that "no doctor has seen me the whole time I've been here."  I explained to him why he has a diverting colostomy and this is a palliative diversion given his colonic tumor and metastatic disease.  We discussed that there is no surgical procedure that can be done to cure his problem.  He has extremely poor insight into his situation and thinks that if he just goes to a "bigger hospital" that they can fix him.  He had a diet with no issues.  Denies abdominal pain or nausea.  ROS: See above, otherwise other systems negative  Objective: Vital signs in last 24 hours: Temp:  [97.3 F (36.3 C)-99.8 F (37.7 C)] 98.2 F (36.8 C) (10/29 0300) Pulse Rate:  [60-83] 77 (10/29 0300) Resp:  [9-29] 26 (10/29 0700) BP: (77-118)/(45-89) 103/73 (10/29 0700) SpO2:  [88 %-100 %] 100 % (10/29 0400) Weight:  [57.9 kg] 57.9 kg (10/29 0339) Last BM Date: 10/17/19  Intake/Output from previous day: 10/28 0701 - 10/29 0700 In: 3240.4 [I.V.:2698.7; IV Piggyback:541.7] Out: [id number] [Urine:1440; Stool:150] Intake/Output this shift: No intake/output data recorded.  PE: Abd: soft, some distention, but has great BS, ostomy working well with feculent output.  Wound is almost healed and looks clean.  Lab Results:  Recent Labs    10/16/19 0559 10/17/19 0344  WBC 15.7* 9.8  HGB 9.7* 8.8*  HCT 30.5* 27.1*  PLT 266 243   BMET Recent Labs    10/16/19 1429 10/17/19 0344  NA 139 137  K 3.1* 3.9  CL 108 109  CO2 23 22  GLUCOSE 163* 137*  BUN 16 15  CREATININE 1.05 1.07  CALCIUM 7.2* 7.4*   PT/INR No results for input(s): LABPROT, INR in the last 72 hours. CMP     Component Value Date/Time   NA 137 10/17/2019 0344   NA 143 12/27/2012   K 3.9 10/17/2019 0344   CL 109 10/17/2019 0344   CO2 22  10/17/2019 0344   GLUCOSE 137 (H) 10/17/2019 0344   BUN 15 10/17/2019 0344   BUN 9 12/27/2012   CREATININE 1.07 10/17/2019 0344   CALCIUM 7.4 (L) 10/17/2019 0344   PROT 6.0 (L) 10/15/2019 1624   ALBUMIN 1.9 (L) 10/15/2019 1624   AST 36 10/15/2019 1624   ALT 19 10/15/2019 1624   ALKPHOS 119 10/15/2019 1624   BILITOT 0.4 10/15/2019 1624   GFRNONAA >60 10/17/2019 0344   GFRAA >60 10/17/2019 0344   Lipase     Component Value Date/Time   LIPASE 21 09/19/2019 1658       Studies/Results: Ct Head Wo Contrast  Result Date: 10/15/2019 CLINICAL DATA:  Altered level of consciousness. Hypotensive. Fever. No reported injury. EXAM: CT HEAD WITHOUT CONTRAST TECHNIQUE: Contiguous axial images were obtained from the base of the skull through the vertex without intravenous contrast. COMPARISON:  None. FINDINGS: Brain: No evidence of parenchymal hemorrhage or extra-axial fluid collection. No mass lesion, mass effect, or midline shift. No CT evidence of acute infarction. Generalized cerebral volume loss. Nonspecific mild subcortical and periventricular white matter hypodensity, most in keeping with chronic small vessel ischemic change. No ventriculomegaly. Vascular: No acute abnormality. Skull: No evidence of calvarial fracture. Sinuses/Orbits: No fluid levels. Mild patchy opacification of the bilateral ethmoidal air cells. Other:  The mastoid  air cells are unopacified. IMPRESSION: 1. No evidence of acute intracranial abnormality. No evidence of calvarial fracture. 2. Generalized cerebral volume loss and mild chronic small vessel ischemic changes in the cerebral white matter. 3. Mild paranasal sinusitis of uncertain chronicity. Electronically Signed   By: Ilona Sorrel M.D.   On: 10/15/2019 19:12   Ct Angio Chest Pe W And/or Wo Contrast  Result Date: 10/15/2019 CLINICAL DATA:  Pain and fevers EXAM: CT ANGIOGRAPHY CHEST CT ABDOMEN AND PELVIS WITH CONTRAST TECHNIQUE: Multidetector CT imaging of the chest  was performed using the standard protocol during bolus administration of intravenous contrast. Multiplanar CT image reconstructions and MIPs were obtained to evaluate the vascular anatomy. Multidetector CT imaging of the abdomen and pelvis was performed using the standard protocol during bolus administration of intravenous contrast. CONTRAST:  75 mL Omnipaque 350 COMPARISON:  09/22/2019, 09/20/2019 FINDINGS: CTA CHEST FINDINGS Cardiovascular: Thoracic aorta and its branches demonstrate atherosclerotic calcifications. No aneurysmal dilatation or dissection is seen. No cardiac enlargement is noted. No significant coronary calcifications are seen. Pulmonary artery is well visualized without intraluminal filling defect to suggest pulmonary embolism. Mediastinum/Nodes: The thoracic inlet shows no focal abnormality. The known left thyroid lesion is not as well visualized due to scatter artifact and enhancement in the thyroid. No significant hilar or mediastinal adenopathy is noted. The esophagus as visualized is within normal limits. Lungs/Pleura: The right lung is well aerated without focal infiltrate or sizable effusion. The left lung demonstrates confluent density arising from the left suprahilar region extending superiorly into the upper lobe. Occlusion of the upper lobe bronchus is noted with some indentation upon the pulmonary artery on the left. This may be related to metastatic disease with peripheral consolidation although the possibility of a primary lesion could not be totally excluded. This is stable from a prior CT examination from 09/20/2019. This area measures at least 6.8 x 4.3 cm. No other focal mass is noted. No sizable effusion is seen. Musculoskeletal: No chest wall abnormality. No acute or significant osseous findings. Review of the MIP images confirms the above findings. CT ABDOMEN and PELVIS FINDINGS Hepatobiliary: Liver demonstrates evidence of an 8.8 x 7.7 cm centrally necrotic mass lesion with  peripheral enhancement consistent with metastatic disease. This corresponds to the recently biopsied liver lesion. No other focal lesion is identified within the liver. The gallbladder is unremarkable. Pancreas: Unremarkable. No pancreatic ductal dilatation or surrounding inflammatory changes. Spleen: Normal in size without focal abnormality. Adrenals/Urinary Tract: Adrenal glands are within normal limits. The kidneys demonstrate cystic change bilaterally. Additionally some patchy irregular enhancement of the right kidney is noted suspicious for pyelonephritis. The bladder is decompressed by Foley catheter. Stomach/Bowel: There are changes consistent with a descending loop colostomy on the left consistent with the given clinical history. The large rectal mass is again identified measuring at least 6.3 cm in transverse diameter and extending for several cm along the course of the rectosigmoid. The previously seen fluid collections in the pelvis is not well visualized and likely represented some dilated colon proximal to the obstructing lesion. Normal decompressed colon is noted proximal to the rectosigmoid mass. No obstructive changes in the large or small bowel are noted. The stomach is within normal limits. There is however a small fluid attenuation area adjacent to the rectal mass best seen on image number 71 of series 3. This measures 3 x 2 cm and is suspicious for a small postoperative abscess. Vascular/Lymphatic: Aortic atherosclerosis. No enlarged abdominal or pelvic lymph nodes. Reproductive: Prostate is  unremarkable. Other: No abdominal wall hernia or abnormality. No abdominopelvic ascites. Musculoskeletal: No acute or significant osseous findings. Review of the MIP images confirms the above findings. IMPRESSION: CTA of the chest: No evidence of pulmonary emboli. Persistent mass lesion arising from the left hilum along the mediastinum in into the upper lobe is again seen stable from prior CT examination.  Again this likely represents metastatic disease with peripheral consolidation although the possibility of a primary lesion deserves consideration. The known left thyroid lesion is not well appreciated on today's exam due to scatter artifact and underlying enhancement of the thyroid. CT of the abdomen and pelvis: Persistent large rectosigmoid mass lesion consistent with the given clinical history of rectal carcinoma. The previously seen fluid collection adjacent to this mass likely represented a dilated loop of colon as no significant abscess was noted on the intraoperative evaluation and no persistent fluid collection is noted on today's exam. There is however a small 3.0 x 2.0 cm fluid collection adjacent to the rectal mass best seen on image number 71 of series 3 suspicious for a small postoperative abscess. Patchy enhancement pattern particularly within the right kidney suspicious for pyelonephritis. Metastatic disease within the posterior aspect of the right lobe of the liver stable from previous studies. New left mid abdominal loop colostomy. Electronically Signed   By: Inez Catalina M.D.   On: 10/15/2019 19:30   Ct Abdomen Pelvis W Contrast  Result Date: 10/15/2019 CLINICAL DATA:  Pain and fevers EXAM: CT ANGIOGRAPHY CHEST CT ABDOMEN AND PELVIS WITH CONTRAST TECHNIQUE: Multidetector CT imaging of the chest was performed using the standard protocol during bolus administration of intravenous contrast. Multiplanar CT image reconstructions and MIPs were obtained to evaluate the vascular anatomy. Multidetector CT imaging of the abdomen and pelvis was performed using the standard protocol during bolus administration of intravenous contrast. CONTRAST:  75 mL Omnipaque 350 COMPARISON:  09/22/2019, 09/20/2019 FINDINGS: CTA CHEST FINDINGS Cardiovascular: Thoracic aorta and its branches demonstrate atherosclerotic calcifications. No aneurysmal dilatation or dissection is seen. No cardiac enlargement is noted. No  significant coronary calcifications are seen. Pulmonary artery is well visualized without intraluminal filling defect to suggest pulmonary embolism. Mediastinum/Nodes: The thoracic inlet shows no focal abnormality. The known left thyroid lesion is not as well visualized due to scatter artifact and enhancement in the thyroid. No significant hilar or mediastinal adenopathy is noted. The esophagus as visualized is within normal limits. Lungs/Pleura: The right lung is well aerated without focal infiltrate or sizable effusion. The left lung demonstrates confluent density arising from the left suprahilar region extending superiorly into the upper lobe. Occlusion of the upper lobe bronchus is noted with some indentation upon the pulmonary artery on the left. This may be related to metastatic disease with peripheral consolidation although the possibility of a primary lesion could not be totally excluded. This is stable from a prior CT examination from 09/20/2019. This area measures at least 6.8 x 4.3 cm. No other focal mass is noted. No sizable effusion is seen. Musculoskeletal: No chest wall abnormality. No acute or significant osseous findings. Review of the MIP images confirms the above findings. CT ABDOMEN and PELVIS FINDINGS Hepatobiliary: Liver demonstrates evidence of an 8.8 x 7.7 cm centrally necrotic mass lesion with peripheral enhancement consistent with metastatic disease. This corresponds to the recently biopsied liver lesion. No other focal lesion is identified within the liver. The gallbladder is unremarkable. Pancreas: Unremarkable. No pancreatic ductal dilatation or surrounding inflammatory changes. Spleen: Normal in size without focal abnormality. Adrenals/Urinary  Tract: Adrenal glands are within normal limits. The kidneys demonstrate cystic change bilaterally. Additionally some patchy irregular enhancement of the right kidney is noted suspicious for pyelonephritis. The bladder is decompressed by Foley  catheter. Stomach/Bowel: There are changes consistent with a descending loop colostomy on the left consistent with the given clinical history. The large rectal mass is again identified measuring at least 6.3 cm in transverse diameter and extending for several cm along the course of the rectosigmoid. The previously seen fluid collections in the pelvis is not well visualized and likely represented some dilated colon proximal to the obstructing lesion. Normal decompressed colon is noted proximal to the rectosigmoid mass. No obstructive changes in the large or small bowel are noted. The stomach is within normal limits. There is however a small fluid attenuation area adjacent to the rectal mass best seen on image number 71 of series 3. This measures 3 x 2 cm and is suspicious for a small postoperative abscess. Vascular/Lymphatic: Aortic atherosclerosis. No enlarged abdominal or pelvic lymph nodes. Reproductive: Prostate is unremarkable. Other: No abdominal wall hernia or abnormality. No abdominopelvic ascites. Musculoskeletal: No acute or significant osseous findings. Review of the MIP images confirms the above findings. IMPRESSION: CTA of the chest: No evidence of pulmonary emboli. Persistent mass lesion arising from the left hilum along the mediastinum in into the upper lobe is again seen stable from prior CT examination. Again this likely represents metastatic disease with peripheral consolidation although the possibility of a primary lesion deserves consideration. The known left thyroid lesion is not well appreciated on today's exam due to scatter artifact and underlying enhancement of the thyroid. CT of the abdomen and pelvis: Persistent large rectosigmoid mass lesion consistent with the given clinical history of rectal carcinoma. The previously seen fluid collection adjacent to this mass likely represented a dilated loop of colon as no significant abscess was noted on the intraoperative evaluation and no persistent  fluid collection is noted on today's exam. There is however a small 3.0 x 2.0 cm fluid collection adjacent to the rectal mass best seen on image number 71 of series 3 suspicious for a small postoperative abscess. Patchy enhancement pattern particularly within the right kidney suspicious for pyelonephritis. Metastatic disease within the posterior aspect of the right lobe of the liver stable from previous studies. New left mid abdominal loop colostomy. Electronically Signed   By: Inez Catalina M.D.   On: 10/15/2019 19:30   Dg Chest Port 1 View  Result Date: 10/16/2019 CLINICAL DATA:  Central line insertion EXAM: PORTABLE CHEST 1 VIEW COMPARISON:  October 15, 2019 FINDINGS: Interval placement of left IJ central line with tip overlying the central SVC. No pneumothorax. Stable left perihilar density better evaluated on recent CT. There is mild elevation of the left hemidiaphragm. No pleural effusion. Stable cardiomediastinal contours. IMPRESSION: New left IJ central line tip overlying the SVC.  No pneumothorax. Electronically Signed   By: Macy Mis M.D.   On: 10/16/2019 12:48   Dg Chest Portable 1 View  Result Date: 10/15/2019 CLINICAL DATA:  Fever EXAM: PORTABLE CHEST 1 VIEW COMPARISON:  September 27, 2015 FINDINGS: There is soft tissue fullness in the left hilar region concerning for adenopathy. There is questionable associated infiltrate in this area. Lungs elsewhere are clear. Heart size is normal. Pulmonary vascularity appears normal. No appreciable bone lesions. IMPRESSION: Suspect left hilar adenopathy with questionable associated pneumonia. Given concern for neoplastic involvement in this area, contrast enhanced chest CT advised. Lungs elsewhere clear.  Heart size  normal. Electronically Signed   By: Lowella Grip III M.D.   On: 10/15/2019 16:39    Anti-infectives: Anti-infectives (From admission, onward)   Start     Dose/Rate Route Frequency Ordered Stop   10/17/19 1800  vancomycin (VANCOCIN)  1,250 mg in sodium chloride 0.9 % 250 mL IVPB     1,250 mg 166.7 mL/hr over 90 Minutes Intravenous Every 48 hours 10/16/19 0456     10/16/19 0800  ceFEPIme (MAXIPIME) 2 g in sodium chloride 0.9 % 100 mL IVPB     2 g 200 mL/hr over 30 Minutes Intravenous Every 12 hours 10/16/19 0456     10/16/19 0800  metroNIDAZOLE (FLAGYL) IVPB 500 mg     500 mg 100 mL/hr over 60 Minutes Intravenous Every 8 hours 10/16/19 0646         Assessment/Plan Urosepsis/pyelonephritis - per medicine H/o prostate cancer COPD  Adenocarcinoma of colon, s/p diverting loop colostomy with small 2x3cm fluid collection near mass -this collection is too small to drain.  It is unlikely to be causing his source of sepsis given the size and lack of abdominal pain. -recommend abx therapy for this collection and nothing more at this point as he is diverted -he may eat from our standpoint -routine ostomy care -aquacel ag on wound for dressing changes every 1-2 days as his wound is almost completely healed -patient will be rescheduled in our office for his post operative follow up - I had a long discussion today with the patient about his colostomy, why he has it, and that he is not a candidate for reversal.  He has VERY POOR insight into his situation and expresses a desire to go a "bigger hospital" to get care.  I informed him that was his decision and freedom to do so.   -he is surgically stable.  No further surgical needs as he is eating and his bag is working well.  This small area near his colonic mass should improve with abx therapy that is being given for his urine/pyelo course. -we will sign off at this time. -our office has been contacted to call his wife to set up follow up visit with Dr. Ninfa Linden if he would like to continue to see [place].    FEN - full liquids VTE - heparin ID - maxipime, flagyl, vanc  Spoke to wife, Danton Clap, and explained his situation from a surgical standpoint.  All questions were elicited and  answered to satisfaction.  Wife is very understanding and has a better grasp of his situation in general.  We discussed the small fluid collection he has right now and that abx should resolve this.  We discussed his colostomy and that there were no plans for further surgical intervention at this time or reversal going forward.  She understood and was grateful for the phone call.   LOS: 1 day    Henreitta Cea , [facility] 10/17/2019, 8:04 AM Please see Amion for pager number during day hours 7:00am-4:30pm

## 2019-10-17 NOTE — Progress Notes (Signed)
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 When asked about his reaction the patient cannot give a good explanation as to what happened in 1972, and he is not sure if he has ever received penicillin-like antibiotics in the recent past. Given that it has been almost 50 years since his unknown reaction and the patient does not express any concerns about what his reaction was, we proceeded with an amoxicillin challenge. Approximately 80% of patients outgrow their type 1 IgE mediated reaction in the first 10 years following their reaction and the chances of them reacting continues to decrease in the years following. Pharmacy has called the nurse taking care of Toran and informed her that after she gives the medication, she should check on the patient every 20 minutes in the hour following to ensure he is tolerating.  Results of the challenge: The patient had no issues, pharmacy has deleted the penicillin allergy from the chart and called his pharmacy, PCP and living facility to de-label his allergy there as well.  Nicoletta Dress, PharmD PGY2 Infectious Disease Pharmacy Resident

## 2019-10-17 NOTE — Progress Notes (Addendum)
Pharmacy Antibiotic Note  Charles Espinoza is a 78 y.o. male admitted on 10/16/2019 with fevers/hypotension, severe sepsis.  Pharmacy has been consulted for Vancomycin and Cefepime dosing.  Vancomycin 1500 mg IV given in ED at 2130.  CT image showing small 2cm x 3cm fluid collection near mass - unable to be drained. UCx GNR and enterococcus, BCx from [facility] growing enterococcus. WBC improving 12>9.8, LA 1.3. Scr improved from 1.28 now to 1.07 (CrCl 45 mL/min). Afebrile. NGTD.     Plan: Adjust vancomycin to 750 mg IV every 24 hours given Scr (estAUC 430) Cefepime 2 gIV q12h Metronidazole 500 mg IV q8h Monitor renal fx, cx results, clinical pic, and vanc levels as appropriate  Weight: 127 lb 10.3 oz (57.9 kg)  Temp (24hrs), Avg:98.3 F (36.8 C), Min:97.3 F (36.3 C), Max:99.8 F (37.7 C)  Recent Labs  Lab 10/15/19 1624 10/16/19 0559 10/16/19 1429 10/17/19 0344  WBC 12.0* 15.7*  --  9.8  CREATININE 1.28* 1.20 1.05 1.07  LATICACIDVEN 1.4  --   --   --     Estimated Creatinine Clearance: 45.1 mL/min (by C-G formula based on SCr of 1.07 mg/dL).    Allergies  Allergen Reactions  . Penicillins     Did it involve swelling of the face/tongue/throat, SOB, or low BP? n/a Did it involve sudden or severe rash/hives, skin peeling, or any reaction on the inside of your mouth or nose? n/a Did you need to seek medical attention at a hospital or doctor's office? n/a When did it last happen?[id number] If all above answers are "NO", may proceed with cephalosporin use.   Antibiotics this Admission:  Cefepime 10/28>>  Metronidazole 10/27>> Vancomycin 10/27>>   Microbiology Results:  C diff ag +, tox -; PCR - >> poss colonized  Bcx 10/28: NGTD  MRSA PCR 10/28: neg Ucx 10/27: 70k GNR Bcx 10/27: Enterococcus faecalis  Antonietta Jewel, PharmD, BCCCP Clinical Pharmacist  Phone: [phone]  Please check AMION for all [place] phone numbers After 10:00 PM, call [employer]  [phone] 10/17/2019 10:34 AM

## 2019-10-17 NOTE — Progress Notes (Signed)
Chaplain offered listening ears and prayer to Mr. Cosper.  Mr. Stcharles expressed his frustration around having a colostomy bag. He stated that he doesn't "want to go out having a bag."  He also stated that the process of changing it was something he did not want to do.  Through conversation, Mr. Snedeker revealed his fear and anxiety around being sick and being in the hospital. This is a new journey for him that he has never experienced before, and he now finds himself losing his memory.  Mr. Winker expressed the changes happening within his body.  Mr. Hollett deeply believes in God, but also has found himself wavering through being in the hospital for about a month.  Chaplain worked to offer Mr. Kosar some peace, comfort and assurance.    Chaplain will continue to follow-up.

## 2019-10-18 DIAGNOSIS — B952 Enterococcus as the cause of diseases classified elsewhere: Secondary | ICD-10-CM

## 2019-10-18 DIAGNOSIS — R7881 Bacteremia: Secondary | ICD-10-CM

## 2019-10-18 DIAGNOSIS — K611 Rectal abscess: Secondary | ICD-10-CM

## 2019-10-18 DIAGNOSIS — A4181 Sepsis due to Enterococcus: Secondary | ICD-10-CM | POA: Diagnosis not present

## 2019-10-18 DIAGNOSIS — R6521 Severe sepsis with septic shock: Secondary | ICD-10-CM | POA: Diagnosis not present

## 2019-10-18 LAB — CBC
HCT: 26.1 % — ABNORMAL LOW (ref 39.0–52.0)
Hemoglobin: 8.5 g/dL — ABNORMAL LOW (ref 13.0–17.0)
MCH: 30.8 pg (ref 26.0–34.0)
MCHC: 32.6 g/dL (ref 30.0–36.0)
MCV: 94.6 fL (ref 80.0–100.0)
Platelets: 248 10*3/uL (ref 150–400)
RBC: 2.76 MIL/uL — ABNORMAL LOW (ref 4.22–5.81)
RDW: 14.6 % (ref 11.5–15.5)
WBC: 8.2 10*3/uL (ref 4.0–10.5)
nRBC: 0 % (ref 0.0–0.2)

## 2019-10-18 LAB — GI PATHOGEN PANEL BY PCR, STOOL

## 2019-10-18 LAB — GLUCOSE, CAPILLARY: Glucose-Capillary: 154 mg/dL — ABNORMAL HIGH (ref 70–99)

## 2019-10-18 LAB — MAGNESIUM: Magnesium: 1.5 mg/dL — ABNORMAL LOW (ref 1.7–2.4)

## 2019-10-18 LAB — BASIC METABOLIC PANEL
Anion gap: 7 (ref 5–15)
BUN: 12 mg/dL (ref 8–23)
CO2: 24 mmol/L (ref 22–32)
Calcium: 7.6 mg/dL — ABNORMAL LOW (ref 8.9–10.3)
Chloride: 109 mmol/L (ref 98–111)
Creatinine, Ser: 0.84 mg/dL (ref 0.61–1.24)
GFR calc Af Amer: >60 mL/min (ref >60)
GFR calc non Af Amer: >60 mL/min (ref >60)
Glucose, Bld: 98 mg/dL (ref 70–99)
Potassium: 3.6 mmol/L (ref 3.5–5.1)
Sodium: 140 mmol/L (ref 135–145)

## 2019-10-18 LAB — PHOSPHORUS: Phosphorus: 1.3 mg/dL — ABNORMAL LOW (ref 2.5–4.6)

## 2019-10-18 LAB — URINE CULTURE: Culture: 70000 — AB

## 2019-10-18 LAB — CALCIUM, IONIZED: Calcium, Ionized, Serum: 4.5 mg/dL (ref 4.5–5.6)

## 2019-10-18 MED ORDER — LOPERAMIDE HCL 2 MG PO CAPS: 2.0000 mg | ORAL_CAPSULE | ORAL | Status: DC | PRN

## 2019-10-18 MED ORDER — MAGNESIUM SULFATE 2 GM/50ML IV SOLN
2.0000 g | Freq: Once | INTRAVENOUS | Status: AC
  Administered 2019-10-18: 2 g via INTRAVENOUS
  Filled 2019-10-18: qty 50

## 2019-10-18 MED ORDER — LACTATED RINGERS IV BOLUS
1000.0000 mL | Freq: Once | INTRAVENOUS | Status: AC
  Administered 2019-10-18: 1000 mL via INTRAVENOUS

## 2019-10-18 NOTE — Progress Notes (Signed)
NAME:  Charles Espinoza, MRN:  [id number], DOB:  01/02/1941, LOS: 2 ADMISSION DATE:  10/16/2019, CONSULTATION DATE:  10/18/19 REFERRING MD:  Dr. Alfred Levins, CHIEF COMPLAINT:  Sepsis    Brief History   78 year old male with recent sigmoid malignant ulceration with subsequent perforation and he underwent ex lap with diverting colostomy, history of prostate cancer and likely recently diagnosed metastatic colorectal cancer who presented to the emergency department with fever and hypotension and likely small rectal postoperative abscess.  History of present illness   Charles Espinoza is a 78 year old male with a history of prostate cancer treated with radiation in 2014-2015 who was recently admitted earlier this month for abdominal pain and found to have complex sigmoid collection.  He underwent an exlap with diverting colostomy on 09/23/2019.  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  He was referred to oncology outpatient and discharged to rehab.  Patient is not sure when his last colonoscopy was.  Yesterday, patient apparently pulled out his Foley catheter in his sleep, it appears that EMS was called for this and found patient to be febrile and hypotensive so he was taken to the emergency department.  In the ED, patient's blood pressure was as low as 66/44, temp 102F.  Labs show leukocytosis of 12.0, creatinine 1.28, K3.2 and normal lactic acid, UA with bacteria and leukocytes.  CT head was without acute findings, CTA chest showed no PE but left lung density likely related to metastatic disease, CT abdomen pelvis with postoperative changes but noted small 3.0 x 2.0 cm fluid collection adjacent to rectal mass suspicious for postoperative abscess.  He was started on Levophed and PCCM consulted for admission.  At the time of admission, patient states that he has been feeling well with no complaints.  He did  not notice any abdominal or rectal pain, no myalgias.  Palliative care was consulted during last admission and patient and family desire aggressive treatment plan and he is full code.  Past Medical History   Past Medical History:  Diagnosis Date  . Colon cancer ([place])   . COPD (chronic obstructive pulmonary disease) ([place])   . Elevated PSA   . Hx of radiation therapy 09/25/13- 11/27/13   prostate 7800 cGy 39 sessions  . Hyperlipidemia   . Prostate cancer ([place]) 02/27/13   gleason 6, volume 133 cc  . Prostatitis   . Sigmoid volvulus ([place])    s/p ex-lap  . Vitamin D deficiency      Significant Hospital Events   10/28 Admit to PCCM  Consults:  Surgery  Procedures:    Significant Diagnostic Tests:  CT head>>no acute findings CT Chest>> L Lung demonstrates confluent density arising from the left suprahilar region extending superiorly into the upper lobe CT abd/pelvis>>Liver demonstrates evidence of an 8.8 x 7.7 cm centrally necrotic mass lesion, small fluid attenuation area adjacent to the rectal mass, 3 x 2 cm and is suspicious for a small postoperative abscess, Patchy enhancement pattern particularly within the right kidney suspicious for pyelonephritis. ECHO 10/28> LVEF 60-65%, normal RV systolic function    Micro Data:  10/27 BCx 1 of 2 Enterococcus Faecalis 10/27 Sars-CoV-2>>neg 10/27 C diff antigen positive, C.diff toxin negative  10/28 BCx2>> 10/28 UC>> 10/30 BCx 22  Antimicrobials:  Vancomycin 10/28-10/29 Cefepime 10/28-10/29 Flagyl 10/28- 10/29 Unasyn 10/29 -   Interim history/subjective:  Remains on vasopressor support. Eating breakfast this morning. Wife at bedside. We  discussed continuing pressors and antibiotics and plan for TEE. I addressed questions to their satisfaction.  Objective   Blood pressure 94/61, pulse 73, temperature 98.1 F (36.7 C), temperature source Oral, resp. rate (!) 21, weight 57.9 kg, SpO2 100 %.        Intake/Output Summary (Last  24 hours) at 10/18/2019 1052 Last data filed at 10/18/2019 0800 Gross per 24 hour  Intake 1760.41 ml  Output 3475 ml  Net -1714.59 ml   Filed Weights   10/16/19 0500 10/17/19 0339  Weight: 55.3 kg 57.9 kg   Physical Exam: General: Chronically ill-appearing, no acute distress HENT: [place], AT, OP clear, MMM Eyes: EOMI, no scleral icterus Respiratory: Clear to auscultation bilaterally.  No crackles, wheezing or rales Cardiovascular: RRR, -M/R/G, no JVD GI: BS+, soft, nontender, ostomy with non-bloody stool output Extremities:-Edema,-tenderness Neuro: AAO x4, CNII-XII grossly intact Skin: Intact, no rashes or bruising Psych: Normal mood, normal affect  Resolved Hospital Problem list     Assessment & Plan:    Septic shock secondary to Enterococcus faecalis bacteremia likely secondary to rectal abscess/UTI Patchy enhancement of R kidney on CT suspicious for pyelo Foley replaced in ED ECHO 10/28 LVEF 60-65%. Normal RV systolic function. IVC with > 50% respiratory variability  Currently on Levophed 20mcg/min P: Fluid collection not large enough for drain per Surgery Continue levophed for MAP goal >65 Continue antibiotics per ID Give 1L LR now DC Flomax  Enterococcus faecalis bacteremia P: Schedule TEE to rule out endocarditis Continue Unasyn per ID Follow-up blood cultures to document clearance of bacteremia  Diarrhea C diff antigen positive, C.diff toxin negative  P: Loperamide PRN DC ensure per patient request and may be contributing to diarrhea  Adenocarcinoma of colon s/p diverting loop colostomy primary colorectal cancer with metastases to the liver, lung, rectum Pathology resulted from liver biopsy earlier this month Last DC summary notes patient has been referred to oncology outpatient P: Wound recs per surgery note  Follow up with onc outpatient  Acute kidney injury, improved  P: Trend renal indices and electrolytes trend UOP  Hypomagnesemia P: -AM  BMP, mag, phos -Replete now -Continue mag ox BID   Best practice:  Diet: Advance diet as tolerated. DC ensure due to patient feeling it causes his diarrhea Pain/Anxiety/Delirium protocol (if indicated): oxycodone PRN VAP protocol (if indicated): n/a DVT prophylaxis: subq heparin GI prophylaxis: none Glucose control: n/a Mobility: bed rest Code Status: full  Family Communication: Updated patient and wife on 10/30 at bedside Disposition: Remain in ICU  Labs   CBC: Recent Labs  Lab 10/15/19 1624 10/16/19 0559 10/17/19 0344 10/18/19 0425  WBC 12.0* 15.7* 9.8 8.2  NEUTROABS 10.2*  --   --   --   HGB 9.6* 9.7* 8.8* 8.5*  HCT 30.0* 30.5* 27.1* 26.1*  MCV 94.6 96.2 94.4 94.6  PLT 273 266 243 [id number]    Basic Metabolic Panel: Recent Labs  Lab 10/15/19 1624 10/16/19 0559 10/16/19 1429 10/17/19 0344 10/18/19 0425  NA 140 141 139 137 140  K 3.2* 2.9* 3.1* 3.9 3.6  CL 103 111 108 109 109  CO2 27 21* 23 22 24   GLUCOSE 133* 127* 163* 137* 98  BUN 18 15 16 15 12   CREATININE 1.28* 1.20 1.05 1.07 0.84  CALCIUM 8.0* 7.4* 7.2* 7.4* 7.6*  MG  --  1.8  --  1.6* 1.5*  PHOS  --  4.0  --   --  1.3*   GFR: Estimated Creatinine Clearance: 57.4 mL/min (by  C-G formula based on SCr of 0.84 mg/dL). Recent Labs  Lab 10/15/19 1624 10/16/19 0559 10/17/19 0344 10/18/19 0425  WBC 12.0* 15.7* 9.8 8.2  LATICACIDVEN 1.4  --   --   --     Liver Function Tests: Recent Labs  Lab 10/15/19 1624  AST 36  ALT 19  ALKPHOS 119  BILITOT 0.4  PROT 6.0*  ALBUMIN 1.9*   No results for input(s): LIPASE, AMYLASE in the last 168 hours. No results for input(s): AMMONIA in the last 168 hours.  ABG No results found for: PHART, PCO2ART, PO2ART, HCO3, TCO2, ACIDBASEDEF, O2SAT   Coagulation Profile: No results for input(s): INR, PROTIME in the last 168 hours.  Cardiac Enzymes: No results for input(s): CKTOTAL, CKMB, CKMBINDEX, TROPONINI in the last 168 hours.  HbA1C: No results found for:  HGBA1C  CBG: Recent Labs  Lab 10/16/19 0358 10/16/19 0753  GLUCAP 130* 94     Critical care time: 39 minutes    The patient is critically ill with multiple organ systems failure and requires high complexity decision making for assessment and support, frequent evaluation and titration of therapies, application of advanced monitoring technologies and extensive interpretation of multiple databases.   Critical Care Time devoted to patient care services described in this note is 39 Minutes.   Rodman Pickle, M.D. [facility] Pulmonary/Critical Care Medicine 10/18/2019 10:56 AM  Pager: [phone] After hours pager: [phone]

## 2019-10-18 NOTE — Progress Notes (Signed)
Nutrition Follow-up  DOCUMENTATION CODES:   Not applicable  INTERVENTION:   - Continue Magic cup TID with meals, each supplement provides 290 kcal and 9 grams of protein  - Snacks TID between meals, RD to order via [place]  - MVI with minerals daily  - Encourage adequate PO intake  NUTRITION DIAGNOSIS:   Inadequate oral intake related to inability to eat as evidenced by NPO status.  Progressing  GOAL:   Patient will meet greater than or equal to 90% of their needs  Progressing  MONITOR:   Diet advancement, Labs, Weight trends, Skin, I & O's, Supplement acceptance  REASON FOR ASSESSMENT:   Malnutrition Screening Tool    ASSESSMENT:   78 year old male who presented to the ED after pulling out Foley and having fever and hypotension. PMH recent sigmoid malignant ulceration with subsequent perforation s/p ex-lap with diverting colostomy, history of prostate cancer and likely recently diagnosed metastatic colorectal cancer, COPD, HLD. Pt admitted with severe sepsis secondary to postop rectal abscess vs UTI/pyelo.  Discussed pt with RN and during ICU rounds. Also discussed pt with CCM. Okay to advance diet to Regular.  Ensure Enlive d/c due to pt feeling it causes diarrhea.  Unable to meet with pt today. Other providers in room at time of RD's attempted visits.  Meal Completion: 50% x 1 recorded meal since diet advanced to Regular  Medications reviewed and include: Colace, magnesium oxide 400 mg BID, MVI with minerals, IV abx, Levophed IVF: NS @ 10 ml/hr  Labs reviewed: phosphorus 1.3, magnesium 1.5, hemoglobin 8.5  UOP: 3475 ml x 24 hours Stool: 200 ml x 24 hours via colostomy  Diet Order:   Diet Order            Diet regular Room service appropriate? Yes; Fluid consistency: Thin  Diet effective now              EDUCATION NEEDS:   Not appropriate for education at this time  Skin:  Skin Assessment: Skin Integrity Issues: Skin  Integrity Issues: Stage II: sacrum Incisions: abdomen  Last BM:  10/18/19 colostomy  Height:   Ht Readings from Last 1 Encounters:  10/09/19 5\' 8"  (1.727 m)    Weight:   Wt Readings from Last 1 Encounters:  10/17/19 57.9 kg    Ideal Body Weight:  70 kg  BMI:  Body mass index is 19.41 kg/m.  Estimated Nutritional Needs:   Kcal:  1800-2000  Protein:  80-100 grams  Fluid:  >/= 1.8 L    Gaynell Face, MS, RD, LDN Inpatient Clinical Dietitian Pager: [phone] Weekend/After Hours: [phone]

## 2019-10-18 NOTE — Progress Notes (Signed)
    [facility] has been requested to perform a transesophageal echocardiogram on Charles Espinoza for bacteremia.  After careful review of history and examination, the risks and benefits of transesophageal echocardiogram have been explained including risks of esophageal damage, perforation (1:10,000 risk), bleeding, pharyngeal hematoma as well as other potential complications associated with conscious sedation including aspiration, arrhythmia, respiratory failure and death. Alternatives to treatment were discussed, questions were answered. Patient is willing to proceed. Plan for general anesthesia for procedure.   Zollie Clemence Ninfa Meeker, PA-C  10/18/2019 3:58 PM

## 2019-10-18 NOTE — Progress Notes (Signed)
Chaplain provided follow-up visit to check on Charles Espinoza and meet his wife.    Chaplain will continue to follow-up.

## 2019-10-18 NOTE — Progress Notes (Signed)
[facility] [place] [facility] for Infectious Disease  Date of Admission:  10/16/2019      Total days of antibiotics 3  Day 2 unasyn            ASSESSMENT: Charles Espinoza is a 78 y.o. male with metastatic prostate cancer here with enterococcous faecalis bacteremia from presumed urinary/GI source; he has a fluid collection at the rectum that is too small to drain as well as stranding of R kidney concerning for pyelonephritis.  Awaiting TEE for endocardial evaluation to ensure short course of IV or early conversion to PO therapy is appropriate for him. Will repeat blood cultures now. He is still requiring low dose levophed at this time and requiring ICU stay.   Palliative care medicine team is following as well for support given complex medical conditions.   He is tolerating Unasyn and passed oral challenge with amoxicillin - will remove allergy and pharmacy team will notify his PCP / pharmacy team.    PLAN: 1. Repeat blood cultures  2. Continue IV unasyn  3. TEE to be done Monday 11/02 4. 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 Chlorhexidine Gluconate Cloth  6 each Topical Daily  . docusate sodium  100 mg Oral Daily  . heparin  5,000 Units Subcutaneous Q8H  . magnesium oxide  400 mg Oral BID  . multivitamin with minerals  1 tablet Oral Daily    SUBJECTIVE: Feeling well today. Never really felt bad in the first place. Concerned about ostomy supplies and would like to discuss recs with  Wife present.    Review of Systems: Review of Systems  Constitutional: Negative for chills, fever, malaise/fatigue and weight loss.  Respiratory: Negative for cough, sputum production and shortness of breath.   Cardiovascular: Negative.    Gastrointestinal: Negative for abdominal pain, diarrhea and vomiting.  Musculoskeletal: Negative for joint pain, myalgias and neck pain.  Skin: Negative for rash.  Neurological: Negative for weakness and headaches.    No Active Allergies  OBJECTIVE: Vitals:   10/18/19 1200 10/18/19 1202 10/18/19 1300 10/18/19 1400  BP: (!) 107/58  (!) 85/53 (!) 89/58  Pulse: 73  86 69  Resp: (!) 29  (!) 31 (!) 22  Temp:  97.6 F (36.4 C)    TempSrc:  Oral    SpO2: 100%  100% 100%  Weight:       Body mass index is 19.41 kg/m.  Physical Exam Constitutional:      Comments: Sitting upright comfortably in bed  HENT:     Mouth/Throat:     Mouth: No oral lesions.     Dentition: Normal dentition. No dental caries.  Eyes:     General: No scleral icterus. Cardiovascular:     Rate and Rhythm: Normal rate and regular rhythm.     Heart sounds: Normal heart sounds.  Pulmonary:     Effort: Pulmonary effort is normal.     Breath sounds: Normal breath sounds.  Abdominal:     General: There is no distension.     Palpations: Abdomen is soft.     Tenderness: There is no abdominal tenderness.     Comments: Ostomy with brown liquid stool. +large flatus  with inflated bag.   Lymphadenopathy:     Cervical: No cervical adenopathy.  Skin:    General: Skin is warm and dry.     Findings: No rash.  Neurological:     Mental Status: He is alert and oriented to person, place, and time.     Lab Results Lab Results  Component Value Date   WBC 8.2 10/18/2019   HGB 8.5 (L) 10/18/2019   HCT 26.1 (L) 10/18/2019   MCV 94.6 10/18/2019   PLT 248 10/18/2019    Lab Results  Component Value Date   CREATININE 0.84 10/18/2019   BUN 12 10/18/2019   NA 140 10/18/2019   K 3.6 10/18/2019   CL 109 10/18/2019   CO2 24 10/18/2019    Lab Results  Component Value Date   ALT 19 10/15/2019   AST 36 10/15/2019   ALKPHOS 119 10/15/2019   BILITOT 0.4 10/15/2019     Microbiology: Recent Results (from the past 240  hour(s))  Urine culture     Status: Abnormal   Collection Time: 10/15/19  4:25 PM   Specimen: Urine, Catheterized  Result Value Ref Range Status   Specimen Description   Final    URINE, CATHETERIZED Performed at [facility], [address] [place], [place] [address]    Special Requests   Final    NONE Performed at [facility], [address] [place], [place] [address]    Culture (A)  Final    70,000 COLONIES/mL PSEUDOMONAS AERUGINOSA 80,000 COLONIES/mL ENTEROCOCCUS FAECALIS    Report Status 10/18/2019 FINAL  Final   Organism ID, Bacteria PSEUDOMONAS AERUGINOSA (A)  Final   Organism ID, Bacteria ENTEROCOCCUS FAECALIS (A)  Final      Susceptibility   Enterococcus faecalis - MIC*    AMPICILLIN <=2 SENSITIVE Sensitive     LEVOFLOXACIN 0.5 SENSITIVE Sensitive     NITROFURANTOIN <=16 SENSITIVE Sensitive     VANCOMYCIN 1 SENSITIVE Sensitive     * 80,000 COLONIES/mL ENTEROCOCCUS FAECALIS   Pseudomonas aeruginosa - MIC*    CEFTAZIDIME 4 SENSITIVE Sensitive     CIPROFLOXACIN <=0.25 SENSITIVE Sensitive     GENTAMICIN 8 INTERMEDIATE Intermediate     IMIPENEM 2 SENSITIVE Sensitive     PIP/TAZO 16 SENSITIVE Sensitive     CEFEPIME 8 SENSITIVE Sensitive     * 70,000 COLONIES/mL PSEUDOMONAS AERUGINOSA  Blood culture (routine x 2)     Status: Abnormal (Preliminary result)   Collection Time: 10/15/19  4:25 PM   Specimen: BLOOD  Result Value Ref Range Status   Specimen Description BLOOD LEFT ANTECUBITAL  Final   Special Requests   Final    BOTTLES DRAWN AEROBIC AND ANAEROBIC Blood Culture results may not be optimal due to an inadequate volume of blood received in culture bottles   Culture  Setup Time   Final    IN BOTH AEROBIC AND ANAEROBIC BOTTLES GRAM POSITIVE COCCI CRITICAL RESULT CALLED TO, READ BACK BY AND VERIFIED WITH: SCOTT HALL AT 0613 ON 10/16/2019 JJB    Culture ENTEROCOCCUS FAECALIS (A)  Final   Report Status PENDING  Incomplete   Organism ID,  Bacteria ENTEROCOCCUS FAECALIS  Final      Susceptibility   Enterococcus faecalis - MIC*    AMPICILLIN <=2 SENSITIVE Sensitive     VANCOMYCIN 1 SENSITIVE Sensitive     GENTAMICIN SYNERGY Value in next row Sensitive      SENSITIVEPerformed at [facility], [address], [place], [place]  [address]    * ENTEROCOCCUS FAECALIS  Blood culture (routine x 2)     Status: None (Preliminary result)   Collection Time: 10/15/19  4:29 PM   Specimen: BLOOD  Result Value Ref Range Status   Specimen Description BLOOD BLOOD RIGHT FOREARM  Final   Special Requests   Final    BOTTLES DRAWN AEROBIC AND ANAEROBIC Blood Culture results may not be optimal due to an inadequate volume of blood received in culture bottles   Culture   Final    NO GROWTH 3 DAYS Performed at [facility], [address] [place], [place] [address]    Report Status PENDING  Incomplete  SARS Coronavirus 2 by RT PCR (hospital order, performed in [place] hospital lab) Nasopharyngeal Nasopharyngeal Swab     Status: None   Collection Time: 10/15/19  4:29 PM   Specimen: Nasopharyngeal Swab  Result Value Ref Range Status   SARS Coronavirus 2 NEGATIVE NEGATIVE Final    Comment: (NOTE) If result is NEGATIVE SARS-CoV-2 target nucleic acids are NOT DETECTED. The SARS-CoV-2 RNA is generally detectable in upper and lower  respiratory specimens during the acute phase of infection. The lowest  concentration of SARS-CoV-2 viral copies this assay can detect is 250  copies / mL. A negative result does not preclude SARS-CoV-2 infection  and should not be used as the sole basis for treatment or other  patient management decisions.  A negative result may occur with  improper specimen collection / handling, submission of specimen other  than nasopharyngeal swab, presence of viral mutation(s) within the  areas targeted by this assay, and inadequate number of viral copies  (<250 copies / mL). A negative result must be combined  with clinical  observations, patient history, and epidemiological information. If result is POSITIVE SARS-CoV-2 target nucleic acids are DETECTED. The SARS-CoV-2 RNA is generally detectable in upper and lower  respiratory specimens dur ing the acute phase of infection.  Positive  results are indicative of active infection with SARS-CoV-2.  Clinical  correlation with patient history and other diagnostic information is  necessary to determine patient infection status.  Positive results do  not rule out bacterial infection or co-infection with other viruses. If result is PRESUMPTIVE POSTIVE SARS-CoV-2 nucleic acids MAY BE PRESENT.   A presumptive positive result was obtained on the submitted specimen  and confirmed on repeat testing.  While 2019 novel coronavirus  (SARS-CoV-2) nucleic acids may be present in the submitted sample  additional confirmatory testing may be necessary for epidemiological  and / or clinical management purposes  to differentiate between  SARS-CoV-2 and other Sarbecovirus currently known to infect humans.  If clinically indicated additional testing with an alternate test  methodology [phone]) is advised. The SARS-CoV-2 RNA is generally  detectable in upper and lower respiratory sp ecimens during the acute  phase of infection. The expected result is Negative. Fact Sheet for Patients:  StrictlyIdeas.no Fact Sheet for Healthcare Providers: BankingDealers.co.za This test is not yet approved or cleared by the [place] FDA and has been authorized for detection and/or diagnosis of SARS-CoV-2 by FDA under an Emergency Use Authorization (EUA).  This EUA will remain in effect (meaning this test can be used) for the duration of the COVID-19 declaration under Section 564(b)(1) of the Act, 21 U.S.C. section 360bbb-3(b)(1), unless the authorization is terminated or revoked sooner. Performed at [facility], [address] [place], [place] [address]   GI pathogen panel by PCR, stool  Status: None   Collection Time: 10/15/19 10:00 PM   Specimen: Stool  Result Value Ref Range Status   Plesiomonas shigelloides NOT DETECTED NOT DETECTED Final   Yersinia enterocolitica NOT DETECTED NOT DETECTED Final   Vibrio NOT DETECTED NOT DETECTED Final   Enteropathogenic E coli NOT DETECTED NOT DETECTED Final   E coli (ETEC) LT/ST NOT DETECTED NOT DETECTED Final   E coli [id number] by PCR Not applicable NOT DETECTED Final   Cryptosporidium by PCR NOT DETECTED NOT DETECTED Final   Entamoeba histolytica NOT DETECTED NOT DETECTED Final   Adenovirus F 40/41 NOT DETECTED NOT DETECTED Final   Norovirus GI/GII NOT DETECTED NOT DETECTED Final   Sapovirus NOT DETECTED NOT DETECTED Final    Comment: (NOTE) Performed At: [facility] [place], [place] [id number] Rush Farmer MD [id number]    Vibrio cholerae NOT DETECTED NOT DETECTED Final   Campylobacter by PCR NOT DETECTED NOT DETECTED Final   Salmonella by PCR NOT DETECTED NOT DETECTED Final   E coli (STEC) NOT DETECTED NOT DETECTED Final   Enteroaggregative E coli NOT DETECTED NOT DETECTED Final   Shigella by PCR NOT DETECTED NOT DETECTED Final   Cyclospora cayetanensis NOT DETECTED NOT DETECTED Final   Astrovirus NOT DETECTED NOT DETECTED Final   G lamblia by PCR NOT DETECTED NOT DETECTED Final   Rotavirus A by PCR NOT DETECTED NOT DETECTED Final  C Difficile Quick Screen w PCR reflex     Status: Abnormal   Collection Time: 10/15/19 10:00 PM   Specimen: Stool  Result Value Ref Range Status   C Diff antigen POSITIVE (A) NEGATIVE Final   C Diff toxin NEGATIVE NEGATIVE Final   C Diff interpretation Results are indeterminate. See PCR results.  Final    Comment: Performed at [facility], [address] [place], [place] [address]  C. Diff by PCR, Reflexed     Status: None   Collection Time: 10/15/19 10:00 PM  Result Value  Ref Range Status   Toxigenic C. Difficile by PCR NEGATIVE NEGATIVE Final    Comment: Patient is colonized with non toxigenic C. difficile. May not need treatment unless significant symptoms are present. Performed at [facility], [address] [place], [place] [address]   MRSA PCR Screening     Status: None   Collection Time: 10/16/19  3:25 AM   Specimen: Nasal Mucosa; Nasopharyngeal  Result Value Ref Range Status   MRSA by PCR NEGATIVE NEGATIVE Final    Comment:        The GeneXpert MRSA Assay (FDA approved for NASAL specimens only), is one component of a comprehensive MRSA colonization surveillance program. It is not intended to diagnose MRSA infection nor to guide or monitor treatment for MRSA infections. Performed at [facility], [place] [address] [place], [place] [address]   Culture, blood (routine x 2)     Status: None (Preliminary result)   Collection Time: 10/16/19  5:55 AM   Specimen: BLOOD RIGHT ARM  Result Value Ref Range Status   Specimen Description BLOOD RIGHT ARM  Final   Special Requests   Final    BOTTLES DRAWN AEROBIC AND ANAEROBIC Blood Culture results may not be optimal due to an inadequate volume of blood received in culture bottles   Culture   Final    NO GROWTH 2 DAYS Performed at [facility], [place] [address], [place], [place] [address]    Report Status PENDING  Incomplete  Culture, blood (routine x 2)     Status: None (Preliminary result)   Collection Time: 10/16/19  5:57 AM   Specimen: BLOOD LEFT ARM  Result Value Ref Range Status   Specimen Description BLOOD LEFT ARM  Final   Special Requests   Final    BOTTLES DRAWN AEROBIC AND ANAEROBIC Blood Culture results may not be optimal due to an inadequate volume of blood received in culture bottles   Culture   Final    NO GROWTH 2 DAYS Performed at [facility], [place] [address] [place], [place] [address]    Report Status PENDING  Incomplete  Culture, blood (Routine X 2) w  Reflex to ID Panel     Status: None (Preliminary result)   Collection Time: 10/18/19 10:39 AM   Specimen: BLOOD  Result Value Ref Range Status   Specimen Description BLOOD RIGHT ANTECUBITAL  Final   Special Requests   Final    BOTTLES DRAWN AEROBIC AND ANAEROBIC Blood Culture adequate volume   Culture   Final    NO GROWTH < 12 HOURS Performed at [facility], [place] [address] [place], [place] [address]    Report Status PENDING  Incomplete  Culture, blood (Routine X 2) w Reflex to ID Panel     Status: None (Preliminary result)   Collection Time: 10/18/19 10:39 AM   Specimen: BLOOD  Result Value Ref Range Status   Specimen Description BLOOD BLOOD RIGHT HAND  Final   Special Requests   Final    BOTTLES DRAWN AEROBIC AND ANAEROBIC Blood Culture adequate volume   Culture   Final    NO GROWTH < 12 HOURS Performed at [facility], [place] [address] [place], [place] [address]    Report Status PENDING  Incomplete    Janene Madeira, MSN, NP-C [place] for Infectious Disease [email or web address] Pager: [phone] Office: [phone] [place]: [phone]

## 2019-10-19 DIAGNOSIS — C20 Malignant neoplasm of rectum: Secondary | ICD-10-CM

## 2019-10-19 DIAGNOSIS — R7881 Bacteremia: Secondary | ICD-10-CM | POA: Diagnosis not present

## 2019-10-19 DIAGNOSIS — K611 Rectal abscess: Secondary | ICD-10-CM | POA: Diagnosis not present

## 2019-10-19 DIAGNOSIS — A419 Sepsis, unspecified organism: Secondary | ICD-10-CM | POA: Diagnosis not present

## 2019-10-19 LAB — CBC
HCT: 25.7 % — ABNORMAL LOW (ref 39.0–52.0)
Hemoglobin: 8.2 g/dL — ABNORMAL LOW (ref 13.0–17.0)
MCH: 30.5 pg (ref 26.0–34.0)
MCHC: 31.9 g/dL (ref 30.0–36.0)
MCV: 95.5 fL (ref 80.0–100.0)
Platelets: 248 10*3/uL (ref 150–400)
RBC: 2.69 MIL/uL — ABNORMAL LOW (ref 4.22–5.81)
RDW: 14.4 % (ref 11.5–15.5)
WBC: 10.1 10*3/uL (ref 4.0–10.5)
nRBC: 0 % (ref 0.0–0.2)

## 2019-10-19 LAB — BASIC METABOLIC PANEL
Anion gap: 8 (ref 5–15)
BUN: 7 mg/dL — ABNORMAL LOW (ref 8–23)
CO2: 25 mmol/L (ref 22–32)
Calcium: 7.4 mg/dL — ABNORMAL LOW (ref 8.9–10.3)
Chloride: 108 mmol/L (ref 98–111)
Creatinine, Ser: 0.8 mg/dL (ref 0.61–1.24)
GFR calc Af Amer: >60 mL/min (ref >60)
GFR calc non Af Amer: >60 mL/min (ref >60)
Glucose, Bld: 93 mg/dL (ref 70–99)
Potassium: 2.9 mmol/L — ABNORMAL LOW (ref 3.5–5.1)
Sodium: 141 mmol/L (ref 135–145)

## 2019-10-19 LAB — MAGNESIUM: Magnesium: 1.5 mg/dL — ABNORMAL LOW (ref 1.7–2.4)

## 2019-10-19 LAB — CALCIUM, IONIZED: Calcium, Ionized, Serum: 4.8 mg/dL (ref 4.5–5.6)

## 2019-10-19 MED ORDER — PIPERACILLIN-TAZOBACTAM 3.375 G IVPB
3.3750 g | Freq: Three times a day (TID) | INTRAVENOUS | Status: DC
  Administered 2019-10-19 – 2019-10-23 (×11): 3.375 g via INTRAVENOUS
  Filled 2019-10-19 (×12): qty 50

## 2019-10-19 MED ORDER — LACTATED RINGERS IV BOLUS
1000.0000 mL | Freq: Once | INTRAVENOUS | Status: AC
  Administered 2019-10-19: 1000 mL via INTRAVENOUS

## 2019-10-19 MED ORDER — POTASSIUM CHLORIDE CRYS ER 20 MEQ PO TBCR
40.0000 meq | EXTENDED_RELEASE_TABLET | ORAL | Status: AC
  Administered 2019-10-19 (×2): 40 meq via ORAL
  Filled 2019-10-19 (×3): qty 2

## 2019-10-19 MED ORDER — MAGNESIUM SULFATE 4 GM/100ML IV SOLN
4.0000 g | Freq: Once | INTRAVENOUS | Status: AC
  Administered 2019-10-19: 4 g via INTRAVENOUS
  Filled 2019-10-19: qty 100

## 2019-10-19 MED ORDER — LACTATED RINGERS IV SOLN
INTRAVENOUS | Status: AC
  Administered 2019-10-19: 20:00:00 via INTRAVENOUS

## 2019-10-19 NOTE — Progress Notes (Signed)
PCCM Interval Note  Off pressors since 11 am. Accepted for transfer to [facility] tomorrow.  Rodman Pickle, M.D. [facility] Pulmonary/Critical Care Medicine 10/19/2019 6:05 PM

## 2019-10-19 NOTE — Progress Notes (Signed)
NAME:  Charles Espinoza, MRN:  [id number], DOB:  11/11/1941, LOS: 3 ADMISSION DATE:  10/16/2019, CONSULTATION DATE:  10/19/19 REFERRING MD:  Dr. Alfred Levins, CHIEF COMPLAINT:  Sepsis    Brief History   78 year old male with recent sigmoid malignant ulceration with subsequent perforation and he underwent ex lap with diverting colostomy, history of prostate cancer and likely recently diagnosed metastatic colorectal cancer who presented to the emergency department with fever and hypotension and likely small rectal postoperative abscess.  History of present illness   Charles Espinoza is a 78 year old male with a history of prostate cancer treated with radiation in 2014-2015 who was recently admitted earlier this month for abdominal pain and found to have complex sigmoid collection.  He underwent an exlap with diverting colostomy on 09/23/2019.  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  He was referred to oncology outpatient and discharged to rehab.  Patient is not sure when his last colonoscopy was.  Yesterday, patient apparently pulled out his Foley catheter in his sleep, it appears that EMS was called for this and found patient to be febrile and hypotensive so he was taken to the emergency department.  In the ED, patient's blood pressure was as low as 66/44, temp 102F.  Labs show leukocytosis of 12.0, creatinine 1.28, K3.2 and normal lactic acid, UA with bacteria and leukocytes.  CT head was without acute findings, CTA chest showed no PE but left lung density likely related to metastatic disease, CT abdomen pelvis with postoperative changes but noted small 3.0 x 2.0 cm fluid collection adjacent to rectal mass suspicious for postoperative abscess.  He was started on Levophed and PCCM consulted for admission.  At the time of admission, patient states that he has been feeling well with no complaints.  He did  not notice any abdominal or rectal pain, no myalgias.  Palliative care was consulted during last admission and patient and family desire aggressive treatment plan and he is full code.  Past Medical History   Past Medical History:  Diagnosis Date  . Colon cancer ([place])   . COPD (chronic obstructive pulmonary disease) ([place])   . Elevated PSA   . Hx of radiation therapy 09/25/13- 11/27/13   prostate 7800 cGy 39 sessions  . Hyperlipidemia   . Prostate cancer ([place]) 02/27/13   gleason 6, volume 133 cc  . Prostatitis   . 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 Vitamin D deficiency      Significant Hospital Events   10/28 Admit to [facility] 10/29-10/31 - Required pressor support Consults:  Surgery  Procedures:    Significant Diagnostic Tests:  CT head>>no acute findings CT Chest>> L Lung demonstrates confluent density arising from the left suprahilar region extending superiorly into the upper lobe CT abd/pelvis>>Liver demonstrates evidence of an 8.8 x 7.7 cm centrally necrotic mass lesion, small fluid attenuation area adjacent to the rectal mass, 3 x 2 cm and is suspicious for a small postoperative abscess, Patchy enhancement pattern particularly within the right kidney suspicious for pyelonephritis. ECHO 10/28> LVEF 60-65%, normal RV systolic function    Micro Data:  10/27 BCx 1 of 2 Enterococcus Faecalis 10/27 Sars-CoV-2>>neg 10/27 C diff antigen positive, C.diff toxin negative  10/28 BCx2>> 10/28 UC>> 10/30 BCx 22  Antimicrobials:  Vancomycin 10/28-10/29 Cefepime 10/28-10/29 Flagyl 10/28- 10/29 Unasyn 10/29 -   Interim history/subjective:  On levophed 1 mcg/min this morning. Reports right  buttocks pain.  Objective   Blood pressure (!) 90/56, pulse 88, temperature 98.4 F (36.9 C), temperature source Oral, resp. rate (!) 31, height 5' 7.99" (1.727 m), weight 50.2 kg, SpO2 100 %.        Intake/Output Summary (Last 24 hours) at 10/19/2019 0851 Last data filed at  10/19/2019 0800 Gross per 24 hour  Intake 1372.24 ml  Output 3305 ml  Net -1932.76 ml   Filed Weights   10/17/19 0339 10/18/19 2259 10/19/19 0618  Weight: 57.9 kg 57.9 kg 50.2 kg   Physical Exam: General: Chronically ill-appearing, no acute distress HENT: [place], AT, OP clear, MMM Eyes: EOMI, no scleral icterus Respiratory: Clear to auscultation bilaterally.  No crackles, wheezing or rales Cardiovascular: RRR, -M/R/G, no JVD GI: BS+, soft, nontender Extremities:-Edema,-tenderness Neuro: AAO x4, CNII-XII grossly intact Skin: Small dressing in place over right buttocks Psych: Normal mood, normal affect  Resolved Hospital Problem list   AKI  Assessment & Plan:   Septic shock secondary to Enterococcus faecalis bacteremia likely secondary to rectal abscess/UTI Patchy enhancement of R kidney on CT suspicious for pyelo Foley replaced in ED ECHO 10/28 LVEF 60-65%. Normal RV systolic function. IVC with > 50% respiratory variability  Currently on Levophed 3mcg/min P: Fluid collection not large enough for drain per Surgery Wean off levophed for MAP goal >65 Continue antibiotics per ID Give additional 1L LR now  Enterococcus faecalis bacteremia P: Plan for TEE to rule out endocarditis Continue Unasyn per ID Follow-up blood cultures to document clearance of bacteremia  Diarrhea C diff antigen positive, C.diff toxin negative  P: Advance diet as tolerated Loperamide PRN DC ensure per patient request and may be contributing to diarrhea  Adenocarcinoma of colon s/p diverting loop colostomy primary colorectal cancer with metastases to the liver, lung, rectum Pathology resulted from liver biopsy earlier this month Last DC summary notes patient has been referred to oncology outpatient P: Wound recs per surgery note  Follow up with onc outpatient  Hypokalemia Hypomagnesemia P: -AM BMP, mag, phos -Replete K and Mg -Continue mag ox BID  -Obtain ionized calcium  Best practice:   Diet: Advance diet as tolerated. DC ensure due to patient feeling it causes his diarrhea Pain/Anxiety/Delirium protocol (if indicated): oxycodone PRN VAP protocol (if indicated): n/a DVT prophylaxis: subq heparin GI prophylaxis: none Glucose control: n/a Mobility: bed rest Code Status: full  Family Communication: Updated patient 10/31 at bedside Disposition: Remain in ICU  Labs   CBC: Recent Labs  Lab 10/15/19 1624 10/16/19 0559 10/17/19 0344 10/18/19 0425 10/19/19 0448  WBC 12.0* 15.7* 9.8 8.2 10.1  NEUTROABS 10.2*  --   --   --   --   HGB 9.6* 9.7* 8.8* 8.5* 8.2*  HCT 30.0* 30.5* 27.1* 26.1* 25.7*  MCV 94.6 96.2 94.4 94.6 95.5  PLT 273 266 243 248 [id number]    Basic Metabolic Panel: Recent Labs  Lab 10/16/19 0559 10/16/19 1429 10/17/19 0344 10/18/19 0425 10/19/19 0448  NA 141 139 137 140 141  K 2.9* 3.1* 3.9 3.6 2.9*  CL 111 108 109 109 108  CO2 21* 23 22 24 25   GLUCOSE 127* 163* 137* 98 93  BUN 15 16 15 12  7*  CREATININE 1.20 1.05 1.07 0.84 0.80  CALCIUM 7.4* 7.2* 7.4* 7.6* 7.4*  MG 1.8  --  1.6* 1.5* 1.5*  PHOS 4.0  --   --  1.3*  --    GFR: Estimated Creatinine Clearance: 52.3 mL/min (by C-G formula based on SCr  of 0.8 mg/dL). Recent Labs  Lab 10/15/19 1624 10/16/19 0559 10/17/19 0344 10/18/19 0425 10/19/19 0448  WBC 12.0* 15.7* 9.8 8.2 10.1  LATICACIDVEN 1.4  --   --   --   --     Liver Function Tests: Recent Labs  Lab 10/15/19 1624  AST 36  ALT 19  ALKPHOS 119  BILITOT 0.4  PROT 6.0*  ALBUMIN 1.9*   No results for input(s): LIPASE, AMYLASE in the last 168 hours. No results for input(s): AMMONIA in the last 168 hours.  ABG No results found for: PHART, PCO2ART, PO2ART, HCO3, TCO2, ACIDBASEDEF, O2SAT   Coagulation Profile: No results for input(s): INR, PROTIME in the last 168 hours.  Cardiac Enzymes: No results for input(s): CKTOTAL, CKMB, CKMBINDEX, TROPONINI in the last 168 hours.  HbA1C: No results found for: HGBA1C  CBG:  Recent Labs  Lab 10/16/19 0358 10/16/19 0753 10/18/19 1557  GLUCAP 130* 94 154*     Critical care time: 32 minutes    The patient requires high complexity decision making for assessment and support, frequent evaluation and titration of therapies, application of advanced monitoring technologies and extensive interpretation of multiple databases.   Critical Care Time devoted to patient care services described in this note is 32 Minutes. This time reflects time of care of this signee Dr. Rodman Pickle.   Rodman Pickle, M.D. [facility] Pulmonary/Critical Care Medicine 10/19/2019 8:51 AM  Pager: [phone] After hours pager: [phone]

## 2019-10-19 NOTE — Progress Notes (Signed)
Patient ID: Karin Pisano, male   DOB: [birth year], 78 y.o.   MRN: [id number]          [facility]    Date of Admission:  10/16/2019   Total days of antibiotics 5        Day 3 ampicillin sulbactam  Mr. Margrave remains on treatment for enterococcal bacteremia and an apparent perirectal abscess.  He inadvertently pulled his Foley catheter out yesterday.  He does not have significant pyuria but urine culture has grown Enterococcus and Pseudomonas.  He remains hypotensive on pressors.  I will change ampicillin sulbactam to piperacillin tazobactam to provide coverage for Enterococcus, Pseudomonas and anaerobes.  Please call me for any ID questions this weekend.         Michel Bickers, MD [facility] [phone] pager   [phone] cell 10/19/2019, 3:53 PM

## 2019-10-19 NOTE — Progress Notes (Signed)
Pharmacy Antibiotic Note  Charles Espinoza is a 78 y.o. male admitted on 10/16/2019 with fevers, hypotension, severe sepsis. CT showed small 2cm x 3cm fluid collection near mass - unable to be drained. Patient was being treated for Enterococcal bacteremia, Enterococcal/Pseudomonal UTI and perirectal abscess.  Pharmacy has been consulted to broaden antibiotic from Unasyn to Zosyn to cover for Pseudomonas as well.  SCr 0.8, CrCL 52 ml/min, afebrile, WBC WNL.  Plan: Zosyn EID 3.375gm IV Q8H Pharmacy will sign off and follow peripherally.  Thank you for the consult!  Height: 5' 7.99" (172.7 cm) Weight: 110 lb 10.7 oz (50.2 kg) IBW/kg (Calculated) : 68.38  Temp (24hrs), Avg:98 F (36.7 C), Min:97.6 F (36.4 C), Max:98.4 F (36.9 C)  Recent Labs  Lab 10/15/19 1624 10/16/19 0559 10/16/19 1429 10/17/19 0344 10/18/19 0425 10/19/19 0448  WBC 12.0* 15.7*  --  9.8 8.2 10.1  CREATININE 1.28* 1.20 1.05 1.07 0.84 0.80  LATICACIDVEN 1.4  --   --   --   --   --     Estimated Creatinine Clearance: 52.3 mL/min (by C-G formula based on SCr of 0.8 mg/dL).    No Active Allergies   Cefepime 10/28>> 10/29 Metronidazole 10/27>>10/29 Vancomycin 10/27>> 10/29 Unasyn 10/29>>10/31 Zosyn 10/31 >>  C diff ag +, tox -; PCR - >> poss colonized  10/30 bld: ngtd Bcx 10/28: NGTD  MRSA PCR 10/28: neg Ucx 10/27: 70k GNR + enterococcus Bcx 10/27: Enterococcus faecalis  Hasel Janish D. Mina Marble, PharmD, BCPS, [place] 10/19/2019, 4:21 PM

## 2019-10-20 LAB — CULTURE, BLOOD (ROUTINE X 2): Culture: NO GROWTH

## 2019-10-20 LAB — MAGNESIUM: Magnesium: 1.8 mg/dL (ref 1.7–2.4)

## 2019-10-20 LAB — BASIC METABOLIC PANEL
Anion gap: 6 (ref 5–15)
BUN: 6 mg/dL — ABNORMAL LOW (ref 8–23)
CO2: 24 mmol/L (ref 22–32)
Calcium: 7.4 mg/dL — ABNORMAL LOW (ref 8.9–10.3)
Chloride: 111 mmol/L (ref 98–111)
Creatinine, Ser: 0.84 mg/dL (ref 0.61–1.24)
GFR calc Af Amer: >60 mL/min (ref >60)
GFR calc non Af Amer: >60 mL/min (ref >60)
Glucose, Bld: 88 mg/dL (ref 70–99)
Potassium: 3.6 mmol/L (ref 3.5–5.1)
Sodium: 141 mmol/L (ref 135–145)

## 2019-10-20 LAB — CBC
HCT: 24.5 % — ABNORMAL LOW (ref 39.0–52.0)
Hemoglobin: 7.9 g/dL — ABNORMAL LOW (ref 13.0–17.0)
MCH: 30.3 pg (ref 26.0–34.0)
MCHC: 32.2 g/dL (ref 30.0–36.0)
MCV: 93.9 fL (ref 80.0–100.0)
Platelets: 202 10*3/uL (ref 150–400)
RBC: 2.61 MIL/uL — ABNORMAL LOW (ref 4.22–5.81)
RDW: 14.6 % (ref 11.5–15.5)
WBC: 10 10*3/uL (ref 4.0–10.5)
nRBC: 0 % (ref 0.0–0.2)

## 2019-10-20 LAB — CALCIUM, IONIZED: Calcium, Ionized, Serum: 4.7 mg/dL (ref 4.5–5.6)

## 2019-10-20 MED ORDER — SODIUM CHLORIDE 0.9 % IV SOLN
INTRAVENOUS | Status: DC
  Administered 2019-10-20: 21:00:00 via INTRAVENOUS

## 2019-10-20 MED ORDER — LACTATED RINGERS IV SOLN
INTRAVENOUS | Status: DC
  Administered 2019-10-20 – 2019-10-22 (×5): via INTRAVENOUS

## 2019-10-20 MED ORDER — POTASSIUM CHLORIDE CRYS ER 20 MEQ PO TBCR
40.0000 meq | EXTENDED_RELEASE_TABLET | ORAL | Status: AC
  Administered 2019-10-20 (×2): 40 meq via ORAL
  Filled 2019-10-20 (×3): qty 2

## 2019-10-20 NOTE — Progress Notes (Signed)
Manually calculated for time change.

## 2019-10-20 NOTE — Anesthesia Preprocedure Evaluation (Addendum)
Anesthesia Evaluation  Patient identified by MRN, date of birth, ID band Patient awake    Reviewed: Allergy & Precautions, NPO status , Patient's Chart, lab work & pertinent test results  History of Anesthesia Complications Negative for: history of anesthetic complications  Airway Mallampati: II  TM Distance: >3 FB     Dental  (+) Edentulous Lower, Poor Dentition, Dental Advisory Given   Pulmonary COPD, Current Smoker,    Pulmonary exam normal        Cardiovascular Normal cardiovascular exam     Neuro/Psych negative neurological ROS     GI/Hepatic ?liver met Rectal CA s/p surgery last month, now with postop abscess.   Endo/Other  negative endocrine ROS  Renal/GU Renal InsufficiencyRenal disease     Musculoskeletal   Abdominal   Peds  Hematology negative hematology ROS (+)   Anesthesia Other Findings   Reproductive/Obstetrics                            Lab Results  Component Value Date   WBC 10.0 10/20/2019   HGB 7.9 (L) 10/20/2019   HCT 24.5 (L) 10/20/2019   MCV 93.9 10/20/2019   PLT 202 10/20/2019   Lab Results  Component Value Date   CREATININE 0.84 10/20/2019   BUN 6 (L) 10/20/2019   NA 141 10/20/2019   K 3.6 10/20/2019   CL 111 10/20/2019   CO2 24 10/20/2019    Anesthesia Physical  Anesthesia Plan  ASA: III  Anesthesia Plan: MAC   Post-op Pain Management:    Induction:   PONV Risk Score and Plan: 2 and Ondansetron and Propofol infusion  Airway Management Planned: Oral ETT, Simple Face Mask and Natural Airway  Additional Equipment:   Intra-op Plan:   Post-operative Plan:   Informed Consent: I have reviewed the patients History and Physical, chart, labs and discussed the procedure including the risks, benefits and alternatives for the proposed anesthesia with the patient or authorized representative who has indicated his/her understanding and acceptance.        Plan Discussed with: CRNA and Anesthesiologist  Anesthesia Plan Comments:        Anesthesia Quick Evaluation

## 2019-10-20 NOTE — Progress Notes (Signed)
Patient transferred to [facility] on tele. RN to receive patient.

## 2019-10-20 NOTE — Progress Notes (Signed)
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  In the emergency room, blood pressure 66/44, temperature 102.  CT head negative.  CTA chest, left lung density likely metastatic disease.  CT abdomen pelvis with postoperative changes, 3 x 2 cm fluid collection adjacent to the rectal mass.  He was treated with vasopressors and admitted to ICU. 10/15/2019, blood cultures with Enterococcus faecalis 10/15/2019, blood cultures positive for coag negative Staphylococcus, 2 out of 4 from same bottle Urine cultures growing Pseudomonas aeruginosa, Enterococcus. C. difficile antigen positive, toxin negative. Transferred out of ICU on 10/20/2019 with stabilization.  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 Admit to ICU.  Treated with Levophed and subsequently stabilized.  Blood pressure is low, adequate MAP.  Adequate mentation.  Off vasopressors for last 24 hours. Right kidney patchy enhancement on CT scan, suspicious for pyelonephritis .  Foley replaced in the ER.  Echocardiogram normal.  Fluid collection not large enough for drain as per surgery. Enterococcus faecalis bacteremia, repeat cultures negative, currently remains on Zosyn. Urine culture with Pseudomonas,  final cultures pending. Followed by infectious disease. Coag negative Staphylococcus is probably contaminant, will monitor. For TEE tomorrow.  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 Patient desired palliative treatment.  Followed by oncology outpatient.  Currently not ready for any treatment.  Hypokalemia: Replaced.  Improved.  Will recheck tomorrow morning.  Replaced further.  Hypomagnesemia: Replace and monitor.  His blood pressures are low, maintaining MAP and clinically stable.  He can be transferred to progressive level of care. Start working with PT OT.  DVT prophylaxis: Subcu heparin Code Status: Full code Family Communication: None Disposition Plan: To progressive bed   Consultants:   Infectious disease  PCCM  Surgery  Procedures:   None  Antimicrobials:   Vancomycin 10/16/2019-10/29  Cefepime, 10/28--10/29  Flagyl, 10/28-10/29  Unasyn 10/29-10/31  Zosyn, 10/31------   Subjective: Patient seen and examined.  No overnight events.  He denied any complaints.  He thinks he is eating well.  Denies any abdominal pain.  Patient states "I would like to go home ultimately after the procedure tomorrow"  Objective: Vitals:   10/20/19 0700 10/20/19 0754 10/20/19 0800 10/20/19 0900  BP: (!) 90/58 (!) 95/59 (!) 88/59 (!) 82/56  Pulse: 90 76 78 85  Resp: (!) 31 (!) 25 19 18   Temp:  97.8 F (36.6 C)    TempSrc:  Oral    SpO2: (!) 70% 100% 100% 99%  Weight:      Height:        Intake/Output Summary (Last 24 hours) at 10/20/2019 1000 Last data filed at 10/20/2019 0800 Gross per 24 hour  Intake 1654.4 ml  Output 1750 ml  Net -95.6 ml   Filed  Weights   10/18/19 2259 10/19/19 0618 10/20/19 0638  Weight: 57.9 kg 50.2 kg 57.2 kg    Examination:  General exam: Appears calm and comfortable, on room air.  Chronically sick looking.  Left IJ CVC present. Respiratory  system: Clear to auscultation. Respiratory effort normal.  No added sound. Cardiovascular system: S1 & S2 heard, RRR. No JVD, murmurs, rubs, gallops or clicks. No pedal edema. Gastrointestinal system: Abdomen is nondistended, soft and nontender. No organomegaly or masses felt. Normal bowel sounds heard. Left lower quadrant colostomy bag with loose brown stool. Midline surgical scar with some drainage on the inferior aspect. Central nervous system: Alert and oriented. No focal neurological deficits. Extremities: Symmetric 5 x 5 power. Skin: He has a pressure ulcer right side of the buttock, small healing stage II. Psychiatry: Judgement and insight appear normal. Mood & affect appropriate.     Data Reviewed: I have personally reviewed following labs and imaging studies  CBC: Recent Labs  Lab 10/15/19 1624 10/16/19 0559 10/17/19 0344 10/18/19 0425 10/19/19 0448 10/20/19 0456  WBC 12.0* 15.7* 9.8 8.2 10.1 10.0  NEUTROABS 10.2*  --   --   --   --   --   HGB 9.6* 9.7* 8.8* 8.5* 8.2* 7.9*  HCT 30.0* 30.5* 27.1* 26.1* 25.7* 24.5*  MCV 94.6 96.2 94.4 94.6 95.5 93.9  PLT 273 266 243 248 248 [id number]   Basic Metabolic Panel: Recent Labs  Lab 10/16/19 0559 10/16/19 1429 10/17/19 0344 10/18/19 0425 10/19/19 0448 10/20/19 0456  NA 141 139 137 140 141 141  K 2.9* 3.1* 3.9 3.6 2.9* 3.6  CL 111 108 109 109 108 111  CO2 21* 23 22 24 25 24   GLUCOSE 127* 163* 137* 98 93 88  BUN 15 16 15 12  7* 6*  CREATININE 1.20 1.05 1.07 0.84 0.80 0.84  CALCIUM 7.4* 7.2* 7.4* 7.6* 7.4* 7.4*  MG 1.8  --  1.6* 1.5* 1.5* 1.8  PHOS 4.0  --   --  1.3*  --   --    GFR: Estimated Creatinine Clearance: 56.7 mL/min (by C-G formula based on SCr of 0.84 mg/dL). Liver Function Tests: Recent Labs  Lab 10/15/19 1624  AST 36  ALT 19  ALKPHOS 119  BILITOT 0.4  PROT 6.0*  ALBUMIN 1.9*   No results for input(s): LIPASE, AMYLASE in the last 168 hours. No results for input(s): AMMONIA in the last 168 hours.  Coagulation Profile: No results for input(s): INR, PROTIME in the last 168 hours. Cardiac Enzymes: No results for input(s): CKTOTAL, CKMB, CKMBINDEX, TROPONINI in the last 168 hours. BNP (last 3 results) No results for input(s): PROBNP in the last 8760 hours. HbA1C: No results for input(s): HGBA1C in the last 72 hours. CBG: Recent Labs  Lab 10/16/19 0358 10/16/19 0753 10/18/19 1557  GLUCAP 130* 94 154*   Lipid Profile: No results for input(s): CHOL, HDL, LDLCALC, TRIG, CHOLHDL, LDLDIRECT in the last 72 hours. Thyroid Function Tests: No results for input(s): TSH, T4TOTAL, FREET4, T3FREE, THYROIDAB in the last 72 hours. Anemia Panel: No results for input(s): VITAMINB12, FOLATE, FERRITIN, TIBC, IRON, RETICCTPCT in the last 72 hours. Sepsis Labs: Recent Labs  Lab 10/15/19 1624  LATICACIDVEN 1.4    Recent Results (from the past 240 hour(s))  Urine culture     Status: Abnormal   Collection Time: 10/15/19  4:25 PM   Specimen: Urine, Catheterized  Result Value Ref Range Status   Specimen Description   Final    URINE, CATHETERIZED  Performed at [facility], [address] [place], [place] [address]    Special Requests   Final    NONE Performed at [facility], [address] [place], [place] [address]    Culture (A)  Final    70,000 COLONIES/mL PSEUDOMONAS AERUGINOSA 80,000 COLONIES/mL ENTEROCOCCUS FAECALIS    Report Status 10/18/2019 FINAL  Final   Organism ID, Bacteria PSEUDOMONAS AERUGINOSA (A)  Final   Organism ID, Bacteria ENTEROCOCCUS FAECALIS (A)  Final      Susceptibility   Enterococcus faecalis - MIC*    AMPICILLIN <=2 SENSITIVE Sensitive     LEVOFLOXACIN 0.5 SENSITIVE Sensitive     NITROFURANTOIN <=16 SENSITIVE Sensitive     VANCOMYCIN 1 SENSITIVE Sensitive     * 80,000 COLONIES/mL ENTEROCOCCUS FAECALIS   Pseudomonas aeruginosa - MIC*    CEFTAZIDIME 4 SENSITIVE Sensitive     CIPROFLOXACIN <=0.25 SENSITIVE Sensitive     GENTAMICIN 8  INTERMEDIATE Intermediate     IMIPENEM 2 SENSITIVE Sensitive     PIP/TAZO 16 SENSITIVE Sensitive     CEFEPIME 8 SENSITIVE Sensitive     * 70,000 COLONIES/mL PSEUDOMONAS AERUGINOSA  Blood culture (routine x 2)     Status: Abnormal   Collection Time: 10/15/19  4:25 PM   Specimen: BLOOD  Result Value Ref Range Status   Specimen Description   Final    BLOOD LEFT ANTECUBITAL Performed at [facility], [address] [place], [place] [address]    Special Requests   Final    BOTTLES DRAWN AEROBIC AND ANAEROBIC Blood Culture results may not be optimal due to an inadequate volume of blood received in culture bottles Performed at [facility], [address] [place], [place] [address]    Culture  Setup Time   Final    IN BOTH AEROBIC AND ANAEROBIC BOTTLES GRAM POSITIVE COCCI CRITICAL RESULT CALLED TO, READ BACK BY AND VERIFIED WITH: SCOTT HALL AT [id number] ON 10/16/2019 JJB Performed at [facility], [address], [place], [place] [address]    Culture (A)  Final    ENTEROCOCCUS FAECALIS STAPHYLOCOCCUS SPECIES (COAGULASE NEGATIVE) THE SIGNIFICANCE OF ISOLATING THIS ORGANISM FROM A SINGLE SET OF BLOOD CULTURES WHEN MULTIPLE SETS ARE DRAWN IS UNCERTAIN. PLEASE NOTIFY THE MICROBIOLOGY DEPARTMENT WITHIN ONE WEEK IF SPECIATION AND SENSITIVITIES ARE REQUIRED. CRITICAL RESULT CALLED TO, READ BACK BY AND VERIFIED WITH: PHARMD Cleotis Lema AUMEISTER [id number] FCP Performed at [facility], [place] [address] [place], [place] [address]    Report Status 10/20/2019 FINAL  Final   Organism ID, Bacteria ENTEROCOCCUS FAECALIS  Final      Susceptibility   Enterococcus faecalis - MIC*    AMPICILLIN <=2 SENSITIVE Sensitive     VANCOMYCIN 1 SENSITIVE Sensitive     GENTAMICIN SYNERGY SENSITIVE Sensitive     * ENTEROCOCCUS FAECALIS  Blood culture (routine x 2)     Status: None   Collection Time: 10/15/19  4:29 PM   Specimen: BLOOD  Result Value Ref Range Status   Specimen  Description BLOOD BLOOD RIGHT FOREARM  Final   Special Requests   Final    BOTTLES DRAWN AEROBIC AND ANAEROBIC Blood Culture results may not be optimal due to an inadequate volume of blood received in culture bottles   Culture   Final    NO GROWTH 5 DAYS Performed at [facility], [address], [place],  [address]    Report Status 10/20/2019 FINAL  Final  SARS  Coronavirus 2 by RT PCR (hospital order, performed in [facility] hospital lab) Nasopharyngeal Nasopharyngeal Swab     Status: None   Collection Time: 10/15/19  4:29 PM   Specimen: Nasopharyngeal Swab  Result Value Ref Range Status   SARS Coronavirus 2 NEGATIVE NEGATIVE Final    Comment: (NOTE) If result is NEGATIVE SARS-CoV-2 target nucleic acids are NOT DETECTED. The SARS-CoV-2 RNA is generally detectable in upper and lower  respiratory specimens during the acute phase of infection. The lowest  concentration of SARS-CoV-2 viral copies this assay can detect is 250  copies / mL. A negative result does not preclude SARS-CoV-2 infection  and should not be used as the sole basis for treatment or other  patient management decisions.  A negative result may occur with  improper specimen collection / handling, submission of specimen other  than nasopharyngeal swab, presence of viral mutation(s) within the  areas targeted by this assay, and inadequate number of viral copies  (<250 copies / mL). A negative result must be combined with clinical  observations, patient history, and epidemiological information. If result is POSITIVE SARS-CoV-2 target nucleic acids are DETECTED. The SARS-CoV-2 RNA is generally detectable in upper and lower  respiratory specimens dur ing the acute phase of infection.  Positive  results are indicative of active infection with SARS-CoV-2.  Clinical  correlation with patient history and other diagnostic information is  necessary to determine patient infection status.  Positive results do   not rule out bacterial infection or co-infection with other viruses. If result is PRESUMPTIVE POSTIVE SARS-CoV-2 nucleic acids MAY BE PRESENT.   A presumptive positive result was obtained on the submitted specimen  and confirmed on repeat testing.  While 2019 novel coronavirus  (SARS-CoV-2) nucleic acids may be present in the submitted sample  additional confirmatory testing may be necessary for epidemiological  and / or clinical management purposes  to differentiate between  SARS-CoV-2 and other Sarbecovirus currently known to infect humans.  If clinically indicated additional testing with an alternate test  methodology [id number]) is advised. The SARS-CoV-2 RNA is generally  detectable in upper and lower respiratory sp ecimens during the acute  phase of infection. The expected result is Negative. Fact Sheet for Patients:  StrictlyIdeas.no Fact Sheet for Healthcare Providers: BankingDealers.co.za This test is not yet approved or cleared by the [place] FDA and has been authorized for detection and/or diagnosis of SARS-CoV-2 by FDA under an Emergency Use Authorization (EUA).  This EUA will remain in effect (meaning this test can be used) for the duration of the COVID-19 declaration under Section 564(b)(1) of the Act, 21 U.S.C. section 360bbb-3(b)(1), unless the authorization is terminated or revoked sooner. Performed at [facility], [address] [place], [place] [address]   GI pathogen panel by PCR, stool     Status: None   Collection Time: 10/15/19 10:00 PM   Specimen: Stool  Result Value Ref Range Status   Plesiomonas shigelloides NOT DETECTED NOT DETECTED Final   Yersinia enterocolitica NOT DETECTED NOT DETECTED Final   Vibrio NOT DETECTED NOT DETECTED Final   Enteropathogenic E coli NOT DETECTED NOT DETECTED Final   E coli (ETEC) LT/ST NOT DETECTED NOT DETECTED Final   E coli [id number] by PCR Not applicable NOT  DETECTED Final   Cryptosporidium by PCR NOT DETECTED NOT DETECTED Final   Entamoeba histolytica NOT DETECTED NOT DETECTED Final   Adenovirus F 40/41 NOT DETECTED NOT DETECTED Final   Norovirus GI/GII NOT DETECTED NOT DETECTED  Final   Sapovirus NOT DETECTED NOT DETECTED Final    Comment: (NOTE) Performed At: [facility] [place], [place] [id number] Rush Farmer MD [id number]    Vibrio cholerae NOT DETECTED NOT DETECTED Final   Campylobacter by PCR NOT DETECTED NOT DETECTED Final   Salmonella by PCR NOT DETECTED NOT DETECTED Final   E coli (STEC) NOT DETECTED NOT DETECTED Final   Enteroaggregative E coli NOT DETECTED NOT DETECTED Final   Shigella by PCR NOT DETECTED NOT DETECTED Final   Cyclospora cayetanensis NOT DETECTED NOT DETECTED Final   Astrovirus NOT DETECTED NOT DETECTED Final   G lamblia by PCR NOT DETECTED NOT DETECTED Final   Rotavirus A by PCR NOT DETECTED NOT DETECTED Final  C Difficile Quick Screen w PCR reflex     Status: Abnormal   Collection Time: 10/15/19 10:00 PM   Specimen: Stool  Result Value Ref Range Status   C Diff antigen POSITIVE (A) NEGATIVE Final   C Diff toxin NEGATIVE NEGATIVE Final   C Diff interpretation Results are indeterminate. See PCR results.  Final    Comment: Performed at [facility], [address] [place], [place] [address]  C. Diff by PCR, Reflexed     Status: None   Collection Time: 10/15/19 10:00 PM  Result Value Ref Range Status   Toxigenic C. Difficile by PCR NEGATIVE NEGATIVE Final    Comment: Patient is colonized with non toxigenic C. difficile. May not need treatment unless significant symptoms are present. Performed at [facility], [address] [place], [place] [address]   MRSA PCR Screening     Status: None   Collection Time: 10/16/19  3:25 AM   Specimen: Nasal Mucosa; Nasopharyngeal  Result Value Ref Range Status   MRSA by PCR NEGATIVE NEGATIVE Final    Comment:         The GeneXpert MRSA Assay (FDA approved for NASAL specimens only), is one component of a comprehensive MRSA colonization surveillance program. It is not intended to diagnose MRSA infection nor to guide or monitor treatment for MRSA infections. Performed at [facility], [place] [address] [place], [place] [address]   Culture, blood (routine x 2)     Status: None (Preliminary result)   Collection Time: 10/16/19  5:55 AM   Specimen: BLOOD RIGHT ARM  Result Value Ref Range Status   Specimen Description BLOOD RIGHT ARM  Final   Special Requests   Final    BOTTLES DRAWN AEROBIC AND ANAEROBIC Blood Culture results may not be optimal due to an inadequate volume of blood received in culture bottles   Culture   Final    NO GROWTH 4 DAYS Performed at [facility], [place] [address] [place], [place] [address]    Report Status PENDING  Incomplete  Culture, blood (routine x 2)     Status: None (Preliminary result)   Collection Time: 10/16/19  5:57 AM   Specimen: BLOOD LEFT ARM  Result Value Ref Range Status   Specimen Description BLOOD LEFT ARM  Final   Special Requests   Final    BOTTLES DRAWN AEROBIC AND ANAEROBIC Blood Culture results may not be optimal due to an inadequate volume of blood received in culture bottles   Culture   Final    NO GROWTH 4 DAYS Performed at [facility], [place] [address], [place], [place] [address]    Report Status PENDING  Incomplete  Culture, blood (Routine X  2) w Reflex to ID Panel     Status: None (Preliminary result)   Collection Time: 10/18/19 10:39 AM   Specimen: BLOOD  Result Value Ref Range Status   Specimen Description BLOOD RIGHT ANTECUBITAL  Final   Special Requests   Final    BOTTLES DRAWN AEROBIC AND ANAEROBIC Blood Culture adequate volume   Culture   Final    NO GROWTH 2 DAYS Performed at [facility], [address] [place], [place] [address]    Report Status PENDING  Incomplete  Culture, blood (Routine X 2) w Reflex to  ID Panel     Status: None (Preliminary result)   Collection Time: 10/18/19 10:39 AM   Specimen: BLOOD  Result Value Ref Range Status   Specimen Description BLOOD BLOOD RIGHT HAND  Final   Special Requests   Final    BOTTLES DRAWN AEROBIC AND ANAEROBIC Blood Culture adequate volume   Culture   Final    NO GROWTH 2 DAYS Performed at [facility], [place] [address] [place], [place] [address]    Report Status PENDING  Incomplete         Radiology Studies: No results found.      Scheduled Meds: . Chlorhexidine Gluconate Cloth  6 each Topical Daily  . docusate sodium  100 mg Oral Daily  . heparin  5,000 Units Subcutaneous Q8H  . magnesium oxide  400 mg Oral BID  . multivitamin with minerals  1 tablet Oral Daily  . potassium chloride  40 mEq Oral Q3H   Continuous Infusions: . sodium chloride Stopped (10/19/19 0946)  . lactated ringers    . norepinephrine (LEVOPHED) Adult infusion Stopped (10/19/19 1412)  . piperacillin-tazobactam (ZOSYN)  IV 3.375 g (10/20/19 0929)     LOS: 4 days    Time spent: 35 minutes     Barb Merino, MD [facility] Pager [phone]

## 2019-10-20 NOTE — H&P (View-Only) (Signed)
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  In the emergency room, blood pressure 66/44, temperature 102.  CT head negative.  CTA chest, left lung density likely metastatic disease.  CT abdomen pelvis with postoperative changes, 3 x 2 cm fluid collection adjacent to the rectal mass.  He was treated with vasopressors and admitted to ICU. 10/15/2019, blood cultures with Enterococcus faecalis 10/15/2019, blood cultures positive for coag negative Staphylococcus, 2 out of 4 from same bottle Urine cultures growing Pseudomonas aeruginosa, Enterococcus. C. difficile antigen positive, toxin negative. Transferred out of ICU on 10/20/2019 with stabilization.  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 Admit to ICU.  Treated with Levophed and subsequently stabilized.  Blood pressure is low, adequate MAP.  Adequate mentation.  Off vasopressors for last 24 hours. Right kidney patchy enhancement on CT scan, suspicious for pyelonephritis .  Foley replaced in the ER.  Echocardiogram normal.  Fluid collection not large enough for drain as per surgery. Enterococcus faecalis bacteremia, repeat cultures negative, currently remains on Zosyn. Urine culture with Pseudomonas,  final cultures pending. Followed by infectious disease. Coag negative Staphylococcus is probably contaminant, will monitor. For TEE tomorrow.  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 Patient desired palliative treatment.  Followed by oncology outpatient.  Currently not ready for any treatment.  Hypokalemia: Replaced.  Improved.  Will recheck tomorrow morning.  Replaced further.  Hypomagnesemia: Replace and monitor.  His blood pressures are low, maintaining MAP and clinically stable.  He can be transferred to progressive level of care. Start working with PT OT.  DVT prophylaxis: Subcu heparin Code Status: Full code Family Communication: None Disposition Plan: To progressive bed   Consultants:   Infectious disease  PCCM  Surgery  Procedures:   None  Antimicrobials:   Vancomycin 10/16/2019-10/29  Cefepime, 10/28--10/29  Flagyl, 10/28-10/29  Unasyn 10/29-10/31  Zosyn, 10/31------   Subjective: Patient seen and examined.  No overnight events.  He denied any complaints.  He thinks he is eating well.  Denies any abdominal pain.  Patient states "I would like to go home ultimately after the procedure tomorrow"  Objective: Vitals:   10/20/19 0700 10/20/19 0754 10/20/19 0800 10/20/19 0900  BP: (!) 90/58 (!) 95/59 (!) 88/59 (!) 82/56  Pulse: 90 76 78 85  Resp: (!) 31 (!) 25 19 18   Temp:  97.8 F (36.6 C)    TempSrc:  Oral    SpO2: (!) 70% 100% 100% 99%  Weight:      Height:        Intake/Output Summary (Last 24 hours) at 10/20/2019 1000 Last data filed at 10/20/2019 0800 Gross per 24 hour  Intake 1654.4 ml  Output 1750 ml  Net -95.6 ml   Filed  Weights   10/18/19 2259 10/19/19 0618 10/20/19 0638  Weight: 57.9 kg 50.2 kg 57.2 kg    Examination:  General exam: Appears calm and comfortable, on room air.  Chronically sick looking.  Left IJ CVC present. Respiratory  system: Clear to auscultation. Respiratory effort normal.  No added sound. Cardiovascular system: S1 & S2 heard, RRR. No JVD, murmurs, rubs, gallops or clicks. No pedal edema. Gastrointestinal system: Abdomen is nondistended, soft and nontender. No organomegaly or masses felt. Normal bowel sounds heard. Left lower quadrant colostomy bag with loose brown stool. Midline surgical scar with some drainage on the inferior aspect. Central nervous system: Alert and oriented. No focal neurological deficits. Extremities: Symmetric 5 x 5 power. Skin: He has a pressure ulcer right side of the buttock, small healing stage II. Psychiatry: Judgement and insight appear normal. Mood & affect appropriate.     Data Reviewed: I have personally reviewed following labs and imaging studies  CBC: Recent Labs  Lab 10/15/19 1624 10/16/19 0559 10/17/19 0344 10/18/19 0425 10/19/19 0448 10/20/19 0456  WBC 12.0* 15.7* 9.8 8.2 10.1 10.0  NEUTROABS 10.2*  --   --   --   --   --   HGB 9.6* 9.7* 8.8* 8.5* 8.2* 7.9*  HCT 30.0* 30.5* 27.1* 26.1* 25.7* 24.5*  MCV 94.6 96.2 94.4 94.6 95.5 93.9  PLT 273 266 243 248 248 [id number]   Basic Metabolic Panel: Recent Labs  Lab 10/16/19 0559 10/16/19 1429 10/17/19 0344 10/18/19 0425 10/19/19 0448 10/20/19 0456  NA 141 139 137 140 141 141  K 2.9* 3.1* 3.9 3.6 2.9* 3.6  CL 111 108 109 109 108 111  CO2 21* 23 22 24 25 24   GLUCOSE 127* 163* 137* 98 93 88  BUN 15 16 15 12  7* 6*  CREATININE 1.20 1.05 1.07 0.84 0.80 0.84  CALCIUM 7.4* 7.2* 7.4* 7.6* 7.4* 7.4*  MG 1.8  --  1.6* 1.5* 1.5* 1.8  PHOS 4.0  --   --  1.3*  --   --    GFR: Estimated Creatinine Clearance: 56.7 mL/min (by C-G formula based on SCr of 0.84 mg/dL). Liver Function Tests: Recent Labs  Lab 10/15/19 1624  AST 36  ALT 19  ALKPHOS 119  BILITOT 0.4  PROT 6.0*  ALBUMIN 1.9*   No results for input(s): LIPASE, AMYLASE in the last 168 hours. No results for input(s): AMMONIA in the last 168 hours.  Coagulation Profile: No results for input(s): INR, PROTIME in the last 168 hours. Cardiac Enzymes: No results for input(s): CKTOTAL, CKMB, CKMBINDEX, TROPONINI in the last 168 hours. BNP (last 3 results) No results for input(s): PROBNP in the last 8760 hours. HbA1C: No results for input(s): HGBA1C in the last 72 hours. CBG: Recent Labs  Lab 10/16/19 0358 10/16/19 0753 10/18/19 1557  GLUCAP 130* 94 154*   Lipid Profile: No results for input(s): CHOL, HDL, LDLCALC, TRIG, CHOLHDL, LDLDIRECT in the last 72 hours. Thyroid Function Tests: No results for input(s): TSH, T4TOTAL, FREET4, T3FREE, THYROIDAB in the last 72 hours. Anemia Panel: No results for input(s): VITAMINB12, FOLATE, FERRITIN, TIBC, IRON, RETICCTPCT in the last 72 hours. Sepsis Labs: Recent Labs  Lab 10/15/19 1624  LATICACIDVEN 1.4    Recent Results (from the past 240 hour(s))  Urine culture     Status: Abnormal   Collection Time: 10/15/19  4:25 PM   Specimen: Urine, Catheterized  Result Value Ref Range Status   Specimen Description   Final    URINE, CATHETERIZED  Performed at [facility], [address] [place], [place] [address]    Special Requests   Final    NONE Performed at [facility], [address] [place], [place] [address]    Culture (A)  Final    70,000 COLONIES/mL PSEUDOMONAS AERUGINOSA 80,000 COLONIES/mL ENTEROCOCCUS FAECALIS    Report Status 10/18/2019 FINAL  Final   Organism ID, Bacteria PSEUDOMONAS AERUGINOSA (A)  Final   Organism ID, Bacteria ENTEROCOCCUS FAECALIS (A)  Final      Susceptibility   Enterococcus faecalis - MIC*    AMPICILLIN <=2 SENSITIVE Sensitive     LEVOFLOXACIN 0.5 SENSITIVE Sensitive     NITROFURANTOIN <=16 SENSITIVE Sensitive     VANCOMYCIN 1 SENSITIVE Sensitive     * 80,000 COLONIES/mL ENTEROCOCCUS FAECALIS   Pseudomonas aeruginosa - MIC*    CEFTAZIDIME 4 SENSITIVE Sensitive     CIPROFLOXACIN <=0.25 SENSITIVE Sensitive     GENTAMICIN 8  INTERMEDIATE Intermediate     IMIPENEM 2 SENSITIVE Sensitive     PIP/TAZO 16 SENSITIVE Sensitive     CEFEPIME 8 SENSITIVE Sensitive     * 70,000 COLONIES/mL PSEUDOMONAS AERUGINOSA  Blood culture (routine x 2)     Status: Abnormal   Collection Time: 10/15/19  4:25 PM   Specimen: BLOOD  Result Value Ref Range Status   Specimen Description   Final    BLOOD LEFT ANTECUBITAL Performed at [facility], [address] [place], [place] [address]    Special Requests   Final    BOTTLES DRAWN AEROBIC AND ANAEROBIC Blood Culture results may not be optimal due to an inadequate volume of blood received in culture bottles Performed at [facility], [address] [place], [place] [address]    Culture  Setup Time   Final    IN BOTH AEROBIC AND ANAEROBIC BOTTLES GRAM POSITIVE COCCI CRITICAL RESULT CALLED TO, READ BACK BY AND VERIFIED WITH: SCOTT HALL AT [phone] ON 10/16/2019 JJB Performed at [facility], [address], [place], [place] [address]    Culture (A)  Final    ENTEROCOCCUS FAECALIS STAPHYLOCOCCUS SPECIES (COAGULASE NEGATIVE) THE SIGNIFICANCE OF ISOLATING THIS ORGANISM FROM A SINGLE SET OF BLOOD CULTURES WHEN MULTIPLE SETS ARE DRAWN IS UNCERTAIN. PLEASE NOTIFY THE MICROBIOLOGY DEPARTMENT WITHIN ONE WEEK IF SPECIATION AND SENSITIVITIES ARE REQUIRED. CRITICAL RESULT CALLED TO, READ BACK BY AND VERIFIED WITH: PHARMD Cleotis Lema AUMEISTER [id number] FCP Performed at [facility], [place] [address] [place], [place] [address]    Report Status 10/20/2019 FINAL  Final   Organism ID, Bacteria ENTEROCOCCUS FAECALIS  Final      Susceptibility   Enterococcus faecalis - MIC*    AMPICILLIN <=2 SENSITIVE Sensitive     VANCOMYCIN 1 SENSITIVE Sensitive     GENTAMICIN SYNERGY SENSITIVE Sensitive     * ENTEROCOCCUS FAECALIS  Blood culture (routine x 2)     Status: None   Collection Time: 10/15/19  4:29 PM   Specimen: BLOOD  Result Value Ref Range Status   Specimen  Description BLOOD BLOOD RIGHT FOREARM  Final   Special Requests   Final    BOTTLES DRAWN AEROBIC AND ANAEROBIC Blood Culture results may not be optimal due to an inadequate volume of blood received in culture bottles   Culture   Final    NO GROWTH 5 DAYS Performed at [facility], [address] [place], [place] [address]    Report Status 10/20/2019 FINAL  Final  SARS  Coronavirus 2 by RT PCR (hospital order, performed in [facility] hospital lab) Nasopharyngeal Nasopharyngeal Swab     Status: None   Collection Time: 10/15/19  4:29 PM   Specimen: Nasopharyngeal Swab  Result Value Ref Range Status   SARS Coronavirus 2 NEGATIVE NEGATIVE Final    Comment: (NOTE) If result is NEGATIVE SARS-CoV-2 target nucleic acids are NOT DETECTED. The SARS-CoV-2 RNA is generally detectable in upper and lower  respiratory specimens during the acute phase of infection. The lowest  concentration of SARS-CoV-2 viral copies this assay can detect is 250  copies / mL. A negative result does not preclude SARS-CoV-2 infection  and should not be used as the sole basis for treatment or other  patient management decisions.  A negative result may occur with  improper specimen collection / handling, submission of specimen other  than nasopharyngeal swab, presence of viral mutation(s) within the  areas targeted by this assay, and inadequate number of viral copies  (<250 copies / mL). A negative result must be combined with clinical  observations, patient history, and epidemiological information. If result is POSITIVE SARS-CoV-2 target nucleic acids are DETECTED. The SARS-CoV-2 RNA is generally detectable in upper and lower  respiratory specimens dur ing the acute phase of infection.  Positive  results are indicative of active infection with SARS-CoV-2.  Clinical  correlation with patient history and other diagnostic information is  necessary to determine patient infection status.  Positive results do   not rule out bacterial infection or co-infection with other viruses. If result is PRESUMPTIVE POSTIVE SARS-CoV-2 nucleic acids MAY BE PRESENT.   A presumptive positive result was obtained on the submitted specimen  and confirmed on repeat testing.  While 2019 novel coronavirus  (SARS-CoV-2) nucleic acids may be present in the submitted sample  additional confirmatory testing may be necessary for epidemiological  and / or clinical management purposes  to differentiate between  SARS-CoV-2 and other Sarbecovirus currently known to infect humans.  If clinically indicated additional testing with an alternate test  methodology [phone]) is advised. The SARS-CoV-2 RNA is generally  detectable in upper and lower respiratory sp ecimens during the acute  phase of infection. The expected result is Negative. Fact Sheet for Patients:  StrictlyIdeas.no Fact Sheet for Healthcare Providers: BankingDealers.co.za This test is not yet approved or cleared by the [place] FDA and has been authorized for detection and/or diagnosis of SARS-CoV-2 by FDA under an Emergency Use Authorization (EUA).  This EUA will remain in effect (meaning this test can be used) for the duration of the COVID-19 declaration under Section 564(b)(1) of the Act, 21 U.S.C. section 360bbb-3(b)(1), unless the authorization is terminated or revoked sooner. Performed at [facility], [address] [place], [place] [address]   GI pathogen panel by PCR, stool     Status: None   Collection Time: 10/15/19 10:00 PM   Specimen: Stool  Result Value Ref Range Status   Plesiomonas shigelloides NOT DETECTED NOT DETECTED Final   Yersinia enterocolitica NOT DETECTED NOT DETECTED Final   Vibrio NOT DETECTED NOT DETECTED Final   Enteropathogenic E coli NOT DETECTED NOT DETECTED Final   E coli (ETEC) LT/ST NOT DETECTED NOT DETECTED Final   E coli [id number] by PCR Not applicable NOT  DETECTED Final   Cryptosporidium by PCR NOT DETECTED NOT DETECTED Final   Entamoeba histolytica NOT DETECTED NOT DETECTED Final   Adenovirus F 40/41 NOT DETECTED NOT DETECTED Final   Norovirus GI/GII NOT DETECTED NOT DETECTED  Final   Sapovirus NOT DETECTED NOT DETECTED Final    Comment: (NOTE) Performed At: [facility] [place], [place] [id number] Rush Farmer MD [id number]    Vibrio cholerae NOT DETECTED NOT DETECTED Final   Campylobacter by PCR NOT DETECTED NOT DETECTED Final   Salmonella by PCR NOT DETECTED NOT DETECTED Final   E coli (STEC) NOT DETECTED NOT DETECTED Final   Enteroaggregative E coli NOT DETECTED NOT DETECTED Final   Shigella by PCR NOT DETECTED NOT DETECTED Final   Cyclospora cayetanensis NOT DETECTED NOT DETECTED Final   Astrovirus NOT DETECTED NOT DETECTED Final   G lamblia by PCR NOT DETECTED NOT DETECTED Final   Rotavirus A by PCR NOT DETECTED NOT DETECTED Final  C Difficile Quick Screen w PCR reflex     Status: Abnormal   Collection Time: 10/15/19 10:00 PM   Specimen: Stool  Result Value Ref Range Status   C Diff antigen POSITIVE (A) NEGATIVE Final   C Diff toxin NEGATIVE NEGATIVE Final   C Diff interpretation Results are indeterminate. See PCR results.  Final    Comment: Performed at [facility], [address] [place], [place] [address]  C. Diff by PCR, Reflexed     Status: None   Collection Time: 10/15/19 10:00 PM  Result Value Ref Range Status   Toxigenic C. Difficile by PCR NEGATIVE NEGATIVE Final    Comment: Patient is colonized with non toxigenic C. difficile. May not need treatment unless significant symptoms are present. Performed at [facility], [address] [place], [place] [address]   MRSA PCR Screening     Status: None   Collection Time: 10/16/19  3:25 AM   Specimen: Nasal Mucosa; Nasopharyngeal  Result Value Ref Range Status   MRSA by PCR NEGATIVE NEGATIVE Final    Comment:         The GeneXpert MRSA Assay (FDA approved for NASAL specimens only), is one component of a comprehensive MRSA colonization surveillance program. It is not intended to diagnose MRSA infection nor to guide or monitor treatment for MRSA infections. Performed at [facility], [place] [address] [place], [place] [address]   Culture, blood (routine x 2)     Status: None (Preliminary result)   Collection Time: 10/16/19  5:55 AM   Specimen: BLOOD RIGHT ARM  Result Value Ref Range Status   Specimen Description BLOOD RIGHT ARM  Final   Special Requests   Final    BOTTLES DRAWN AEROBIC AND ANAEROBIC Blood Culture results may not be optimal due to an inadequate volume of blood received in culture bottles   Culture   Final    NO GROWTH 4 DAYS Performed at [facility], [place] [address] [place], [place] [address]    Report Status PENDING  Incomplete  Culture, blood (routine x 2)     Status: None (Preliminary result)   Collection Time: 10/16/19  5:57 AM   Specimen: BLOOD LEFT ARM  Result Value Ref Range Status   Specimen Description BLOOD LEFT ARM  Final   Special Requests   Final    BOTTLES DRAWN AEROBIC AND ANAEROBIC Blood Culture results may not be optimal due to an inadequate volume of blood received in culture bottles   Culture   Final    NO GROWTH 4 DAYS Performed at [facility], [place] [address] [place],  [address]    Report Status PENDING  Incomplete  Culture, blood (Routine X  2) w Reflex to ID Panel     Status: None (Preliminary result)   Collection Time: 10/18/19 10:39 AM   Specimen: BLOOD  Result Value Ref Range Status   Specimen Description BLOOD RIGHT ANTECUBITAL  Final   Special Requests   Final    BOTTLES DRAWN AEROBIC AND ANAEROBIC Blood Culture adequate volume   Culture   Final    NO GROWTH 2 DAYS Performed at [facility], [address], [place], [place] [address]    Report Status PENDING  Incomplete  Culture, blood (Routine X 2) w Reflex to  ID Panel     Status: None (Preliminary result)   Collection Time: 10/18/19 10:39 AM   Specimen: BLOOD  Result Value Ref Range Status   Specimen Description BLOOD BLOOD RIGHT HAND  Final   Special Requests   Final    BOTTLES DRAWN AEROBIC AND ANAEROBIC Blood Culture adequate volume   Culture   Final    NO GROWTH 2 DAYS Performed at [facility], [place] [address] [place], [place] [address]    Report Status PENDING  Incomplete         Radiology Studies: No results found.      Scheduled Meds: . Chlorhexidine Gluconate Cloth  6 each Topical Daily  . docusate sodium  100 mg Oral Daily  . heparin  5,000 Units Subcutaneous Q8H  . magnesium oxide  400 mg Oral BID  . multivitamin with minerals  1 tablet Oral Daily  . potassium chloride  40 mEq Oral Q3H   Continuous Infusions: . sodium chloride Stopped (10/19/19 0946)  . lactated ringers    . norepinephrine (LEVOPHED) Adult infusion Stopped (10/19/19 1412)  . piperacillin-tazobactam (ZOSYN)  IV 3.375 g (10/20/19 0929)     LOS: 4 days    Time spent: 35 minutes     Barb Merino, MD [facility] Pager [phone]

## 2019-10-20 NOTE — Progress Notes (Signed)
PHARMACY - PHYSICIAN COMMUNICATION CRITICAL VALUE ALERT - BLOOD CULTURE IDENTIFICATION (BCID)  Charles Espinoza is an 78 y.o. male who presented to [facility] on 10/16/2019 with a chief complaint of fever  Assessment: Patient is already growing enterococcus faecalis in the blood and receiving treatment per ID. Micro called to update he is also growing coag neg staph in 2/4 bottles drawn on the 27th from the same set. Likely contaminant and BCID will not be done on these  Name of physician (or Provider) Contacted: Dr. Sloan Leiter  Current antibiotics: zosyn  Changes to prescribed antibiotics recommended:  Recommendations accepted by provider  Continue current therapy  No results found for this or any previous visit.  Phillis Haggis 10/20/2019  9:50 AM

## 2019-10-20 NOTE — Progress Notes (Signed)
After cleaning patient up from his abcess leaking and wetting the bed, patient became upset. I asked him, "was he ok?" He states, "I don't wanna live like this." I offered support and prayer. He refused prayer. I asked him if he was comfortable or if there was anything we could do to help. He says, "no I'm fine."

## 2019-10-21 ENCOUNTER — Inpatient Hospital Stay (HOSPITAL_COMMUNITY): Payer: Medicare Other | Admitting: Anesthesiology

## 2019-10-21 ENCOUNTER — Inpatient Hospital Stay (HOSPITAL_COMMUNITY): Payer: Medicare Other

## 2019-10-21 ENCOUNTER — Encounter (HOSPITAL_COMMUNITY): Payer: Self-pay | Admitting: Certified Registered Nurse Anesthetist

## 2019-10-21 ENCOUNTER — Encounter (HOSPITAL_COMMUNITY)
Admission: AD | Disposition: A | Discharge status: Hospice | Payer: Self-pay | Source: Other Acute Inpatient Hospital | Attending: Internal Medicine

## 2019-10-21 DIAGNOSIS — R7881 Bacteremia: Secondary | ICD-10-CM

## 2019-10-21 DIAGNOSIS — C787 Secondary malignant neoplasm of liver and intrahepatic bile duct: Secondary | ICD-10-CM

## 2019-10-21 DIAGNOSIS — C61 Malignant neoplasm of prostate: Secondary | ICD-10-CM

## 2019-10-21 HISTORY — PX: TEE WITHOUT CARDIOVERSION: SHX5443

## 2019-10-21 LAB — CULTURE, BLOOD (ROUTINE X 2)
Culture: NO GROWTH
Culture: NO GROWTH

## 2019-10-21 LAB — CBC
HCT: 25.3 % — ABNORMAL LOW (ref 39.0–52.0)
Hemoglobin: 8.3 g/dL — ABNORMAL LOW (ref 13.0–17.0)
MCH: 31 pg (ref 26.0–34.0)
MCHC: 32.8 g/dL (ref 30.0–36.0)
MCV: 94.4 fL (ref 80.0–100.0)
Platelets: 261 10*3/uL (ref 150–400)
RBC: 2.68 MIL/uL — ABNORMAL LOW (ref 4.22–5.81)
RDW: 14.8 % (ref 11.5–15.5)
WBC: 8.1 10*3/uL (ref 4.0–10.5)
nRBC: 0 % (ref 0.0–0.2)

## 2019-10-21 LAB — BASIC METABOLIC PANEL
Anion gap: 5 (ref 5–15)
BUN: 6 mg/dL — ABNORMAL LOW (ref 8–23)
CO2: 24 mmol/L (ref 22–32)
Calcium: 7.5 mg/dL — ABNORMAL LOW (ref 8.9–10.3)
Chloride: 112 mmol/L — ABNORMAL HIGH (ref 98–111)
Creatinine, Ser: 0.85 mg/dL (ref 0.61–1.24)
GFR calc Af Amer: >60 mL/min (ref >60)
GFR calc non Af Amer: >60 mL/min (ref >60)
Glucose, Bld: 97 mg/dL (ref 70–99)
Potassium: 4 mmol/L (ref 3.5–5.1)
Sodium: 141 mmol/L (ref 135–145)

## 2019-10-21 LAB — MAGNESIUM: Magnesium: 1.6 mg/dL — ABNORMAL LOW (ref 1.7–2.4)

## 2019-10-21 SURGERY — ECHOCARDIOGRAM, TRANSESOPHAGEAL
Anesthesia: Monitor Anesthesia Care

## 2019-10-21 MED ORDER — ALTEPLASE 2 MG IJ SOLR
2.0000 mg | INTRAMUSCULAR | Status: AC
  Administered 2019-10-21 (×2): 2 mg

## 2019-10-21 MED ORDER — PROPOFOL 10 MG/ML IV BOLUS
INTRAVENOUS | Status: DC | PRN
  Administered 2019-10-21: 10 mg via INTRAVENOUS

## 2019-10-21 MED ORDER — PROPOFOL 500 MG/50ML IV EMUL
INTRAVENOUS | Status: DC | PRN
  Administered 2019-10-21: 75 ug/kg/min via INTRAVENOUS

## 2019-10-21 MED ORDER — MAGNESIUM SULFATE 2 GM/50ML IV SOLN
2.0000 g | Freq: Once | INTRAVENOUS | Status: AC
  Administered 2019-10-21: 2 g via INTRAVENOUS
  Filled 2019-10-21: qty 50

## 2019-10-21 MED ORDER — PHENYLEPHRINE 40 MCG/ML (10ML) SYRINGE FOR IV PUSH (FOR BLOOD PRESSURE SUPPORT)
PREFILLED_SYRINGE | INTRAVENOUS | Status: DC | PRN
  Administered 2019-10-21 (×4): 80 ug via INTRAVENOUS

## 2019-10-21 MED ORDER — ONDANSETRON HCL 4 MG/2ML IJ SOLN
INTRAMUSCULAR | Status: DC | PRN
  Administered 2019-10-21: 4 mg via INTRAVENOUS

## 2019-10-21 NOTE — Progress Notes (Signed)
Brief Pharmacy Note  Patient is an 78 y/o M  with hx of prostate cancer, rectal cancer, metastasis to liver, ex lap with diverting colostomy who presented to [facility] ED 10/27 with sepsis and was subsequently transferred to [facility] for further care. Blood cultures drawn at [facility] have resulted 2/4 E faecalis and coag negative staph. E faecalis is pan-sensitive to tested antimicrobials including ampicillin. Urine cultures have resulted PSA (pip/tazo sensitive) and E faecalis (ampicillin sensitive). He remains admitted at [facility] on pip/tazo with ID following. Considering he is appropriately covered and treatment team aware of sensitivities per chart will not pursue further intervention.   [place] Resident 21 Oct 2019

## 2019-10-21 NOTE — Transfer of Care (Signed)
Immediate Anesthesia Transfer of Care Note  Patient: Charles Espinoza  Procedure(s) Performed: TRANSESOPHAGEAL ECHOCARDIOGRAM (TEE) (N/A )  Patient Location: Endoscopy Unit  Anesthesia Type:MAC  Level of Consciousness: awake, alert  and oriented  Airway & Oxygen Therapy: Patient Spontanous Breathing and Patient connected to face mask oxygen  Post-op Assessment: Report given to RN and Post -op Vital signs reviewed and stable  Post vital signs: Reviewed and stable  Last Vitals:  Vitals Value Taken Time  BP 91/48 10/21/19 0909  Temp    Pulse 67 10/21/19 0909  Resp 25 10/21/19 0909  SpO2 100 % 10/21/19 0909  Vitals shown include unvalidated device data.  Last Pain:  Vitals:   10/21/19 0908  TempSrc:   PainSc: 0-No pain      Patients Stated Pain Goal: 3 (87/86/76 7209)  Complications: No apparent anesthesia complications

## 2019-10-21 NOTE — Progress Notes (Signed)
Urine from the foley cath. now  clear yellow output.

## 2019-10-21 NOTE — Progress Notes (Signed)
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  In the emergency room, blood pressure 66/44, temperature 102.  CT head negative.  CTA chest, left lung density likely metastatic disease.  CT abdomen pelvis with postoperative changes, 3 x 2 cm fluid collection adjacent to the rectal mass.  He was treated with vasopressors and admitted to ICU. 10/15/2019, blood cultures with Enterococcus faecalis 10/15/2019, blood cultures positive for coag negative Staphylococcus, 2 out of 4 from same bottle Urine cultures growing Pseudomonas aeruginosa, Enterococcus. C. difficile antigen positive, toxin negative. Transferred out of ICU on 10/20/2019 with stabilization.  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 Admitted to ICU and treated with Levophed and subsequently stabilized.  Blood pressure is low, adequate MAP.  Adequate mentation.  Off vasopressors . Right kidney patchy enhancement on CT scan, suspicious for pyelonephritis .  Foley replaced in the ER.  Echocardiogram normal.  Fluid collection not large enough for drain as per surgery. Enterococcus faecalis bacteremia, repeat cultures negative, currently remains on Zosyn. Urine culture with Pseudomonas,  Followed  by infectious disease. Coag negative Staphylococcus is probably contaminant, will monitor. TEE with no evidence of endocarditis. Infectious disease suggested to continue Zosyn while in the hospital, convert to Augmentin until 11/01/2019.  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 Patient desired palliative treatment.  Followed by oncology outpatient.  Currently not ready for any treatment.  Outpatient follow-up after improvement.  Hypokalemia: Replaced.  Improved.  Will recheck tomorrow morning.  Replaced further.  Hypomagnesemia: Replace and monitor.  Work with PT OT.  Patient wants to go home.  We will see if he is ready by tomorrow.  If he goes home he will go home with outpatient PT OT.  DVT prophylaxis: Subcu heparin Code Status: Full code Family Communication: None Disposition Plan: Remains in the hospital.  Anticipate home with home health OT PT or SNF.  He wants to go home.   Consultants:   Infectious disease  PCCM  Surgery  Procedures:   None  Antimicrobials:   Vancomycin 10/16/2019-10/29  Cefepime, 10/28--10/29  Flagyl, 10/28-10/29  Unasyn 10/29-10/31  Zosyn, 10/31------   Subjective: Patient seen and examined.  No overnight events. Denies any abdominal pain.  Patient states "I would like to go home". "let me walk and go around, let me go pee in bathroom and take this catheter out".  Objective: Vitals:   10/21/19 0908 10/21/19 0920 10/21/19 1051 10/21/19 1133  BP: (!) 91/48 (!) 89/51 (!) 85/62   Pulse: 69 73 69   Resp: 13 (!) 24 18   Temp: 98.6 F (37 C)  97.6 F (36.4 C)   TempSrc: Temporal  Oral   SpO2: 100% 99% 99% 100%  Weight:  Height:        Intake/Output Summary (Last 24 hours) at 10/21/2019 1323 Last data filed at 10/21/2019 1300 Gross per 24 hour  Intake 2355.83 ml  Output 35 ml  Net 2320.83 ml   Filed Weights   10/19/19 0618 10/20/19  0638 10/21/19 0355  Weight: 50.2 kg 57.2 kg 61 kg    Examination:  General exam: Appears calm and comfortable, on room air.  Chronically sick looking.  Left IJ CVC present. Respiratory system: Clear to auscultation. Respiratory effort normal.  No added sound. Cardiovascular system: S1 & S2 heard, RRR. No JVD, murmurs, rubs, gallops or clicks. No pedal edema. Gastrointestinal system: Abdomen is nondistended, soft and nontender. No organomegaly or masses felt. Normal bowel sounds heard. Left lower quadrant colostomy bag with loose brown stool and lot of gas. Midline surgical scar with some drainage on the inferior aspect. Foley catheter with dark urine. Central nervous system: Alert and oriented. No focal neurological deficits. Extremities: Symmetric 5 x 5 power. Skin: He has a pressure ulcer right side of the buttock, small healing stage II. Psychiatry: Judgement and insight appear normal. Mood & affect appropriate.     Data Reviewed: I have personally reviewed following labs and imaging studies  CBC: Recent Labs  Lab 10/15/19 1624  10/17/19 0344 10/18/19 0425 10/19/19 0448 10/20/19 0456 10/21/19 0221  WBC 12.0*   < > 9.8 8.2 10.1 10.0 8.1  NEUTROABS 10.2*  --   --   --   --   --   --   HGB 9.6*   < > 8.8* 8.5* 8.2* 7.9* 8.3*  HCT 30.0*   < > 27.1* 26.1* 25.7* 24.5* 25.3*  MCV 94.6   < > 94.4 94.6 95.5 93.9 94.4  PLT 273   < > 243 248 248 202 261   < > = values in this interval not displayed.   Basic Metabolic Panel: Recent Labs  Lab 10/16/19 0559  10/17/19 0344 10/18/19 0425 10/19/19 0448 10/20/19 0456 10/21/19 0221  NA 141   < > 137 140 141 141 141  K 2.9*   < > 3.9 3.6 2.9* 3.6 4.0  CL 111   < > 109 109 108 111 112*  CO2 21*   < > 22 24 25 24 24   GLUCOSE 127*   < > 137* 98 93 88 97  BUN 15   < > 15 12 7* 6* 6*  CREATININE 1.20   < > 1.07 0.84 0.80 0.84 0.85  CALCIUM 7.4*   < > 7.4* 7.6* 7.4* 7.4* 7.5*  MG 1.8  --  1.6* 1.5* 1.5* 1.8 1.6*  PHOS 4.0  --   --   1.3*  --   --   --    < > = values in this interval not displayed.   GFR: Estimated Creatinine Clearance: 59.8 mL/min (by C-G formula based on SCr of 0.85 mg/dL). Liver Function Tests: Recent Labs  Lab 10/15/19 1624  AST 36  ALT 19  ALKPHOS 119  BILITOT 0.4  PROT 6.0*  ALBUMIN 1.9*   No results for input(s): LIPASE, AMYLASE in the last 168 hours. No results for input(s): AMMONIA in the last 168 hours. Coagulation Profile: No results for input(s): INR, PROTIME in the last 168 hours. Cardiac Enzymes: No results for input(s): CKTOTAL, CKMB, CKMBINDEX, TROPONINI in the last 168 hours. BNP (last 3 results) No results for input(s): PROBNP in the last 8760 hours. HbA1C: No results for input(s): HGBA1C in the  last 72 hours. CBG: Recent Labs  Lab 10/16/19 0358 10/16/19 0753 10/18/19 1557  GLUCAP 130* 94 154*   Lipid Profile: No results for input(s): CHOL, HDL, LDLCALC, TRIG, CHOLHDL, LDLDIRECT in the last 72 hours. Thyroid Function Tests: No results for input(s): TSH, T4TOTAL, FREET4, T3FREE, THYROIDAB in the last 72 hours. Anemia Panel: No results for input(s): VITAMINB12, FOLATE, FERRITIN, TIBC, IRON, RETICCTPCT in the last 72 hours. Sepsis Labs: Recent Labs  Lab 10/15/19 1624  LATICACIDVEN 1.4    Recent Results (from the past 240 hour(s))  Urine culture     Status: Abnormal   Collection Time: 10/15/19  4:25 PM   Specimen: Urine, Catheterized  Result Value Ref Range Status   Specimen Description   Final    URINE, CATHETERIZED Performed at [facility], [address], [place], [place] [address]    Special Requests   Final    NONE Performed at [facility], [address] [place], [place] [address]    Culture (A)  Final    70,000 COLONIES/mL PSEUDOMONAS AERUGINOSA 80,000 COLONIES/mL ENTEROCOCCUS FAECALIS    Report Status 10/18/2019 FINAL  Final   Organism ID, Bacteria PSEUDOMONAS AERUGINOSA (A)  Final   Organism ID, Bacteria ENTEROCOCCUS  FAECALIS (A)  Final      Susceptibility   Enterococcus faecalis - MIC*    AMPICILLIN <=2 SENSITIVE Sensitive     LEVOFLOXACIN 0.5 SENSITIVE Sensitive     NITROFURANTOIN <=16 SENSITIVE Sensitive     VANCOMYCIN 1 SENSITIVE Sensitive     * 80,000 COLONIES/mL ENTEROCOCCUS FAECALIS   Pseudomonas aeruginosa - MIC*    CEFTAZIDIME 4 SENSITIVE Sensitive     CIPROFLOXACIN <=0.25 SENSITIVE Sensitive     GENTAMICIN 8 INTERMEDIATE Intermediate     IMIPENEM 2 SENSITIVE Sensitive     PIP/TAZO 16 SENSITIVE Sensitive     CEFEPIME 8 SENSITIVE Sensitive     * 70,000 COLONIES/mL PSEUDOMONAS AERUGINOSA  Blood culture (routine x 2)     Status: Abnormal   Collection Time: 10/15/19  4:25 PM   Specimen: BLOOD  Result Value Ref Range Status   Specimen Description   Final    BLOOD LEFT ANTECUBITAL Performed at [facility], [address] [place], [place] [address]    Special Requests   Final    BOTTLES DRAWN AEROBIC AND ANAEROBIC Blood Culture results may not be optimal due to an inadequate volume of blood received in culture bottles Performed at [facility], [address] [place], [place] [address]    Culture  Setup Time   Final    IN BOTH AEROBIC AND ANAEROBIC BOTTLES GRAM POSITIVE COCCI CRITICAL RESULT CALLED TO, READ BACK BY AND VERIFIED WITH: SCOTT HALL AT [phone] ON 10/16/2019 JJB Performed at [facility], [address] [place], [place] [address]    Culture (A)  Final    ENTEROCOCCUS FAECALIS STAPHYLOCOCCUS SPECIES (COAGULASE NEGATIVE) THE SIGNIFICANCE OF ISOLATING THIS ORGANISM FROM A SINGLE SET OF BLOOD CULTURES WHEN MULTIPLE SETS ARE DRAWN IS UNCERTAIN. PLEASE NOTIFY THE MICROBIOLOGY DEPARTMENT WITHIN ONE WEEK IF SPECIATION AND SENSITIVITIES ARE REQUIRED. CRITICAL RESULT CALLED TO, READ BACK BY AND VERIFIED WITH: PHARMD Cleotis Lema AUMEISTER [id number] FCP Performed at [facility], [place] [address] [place], [place] [address]    Report Status 10/20/2019  FINAL  Final   Organism ID, Bacteria ENTEROCOCCUS FAECALIS  Final      Susceptibility   Enterococcus faecalis - MIC*    AMPICILLIN <=2 SENSITIVE  Sensitive     VANCOMYCIN 1 SENSITIVE Sensitive     GENTAMICIN SYNERGY SENSITIVE Sensitive     * ENTEROCOCCUS FAECALIS  Blood culture (routine x 2)     Status: None   Collection Time: 10/15/19  4:29 PM   Specimen: BLOOD  Result Value Ref Range Status   Specimen Description BLOOD BLOOD RIGHT FOREARM  Final   Special Requests   Final    BOTTLES DRAWN AEROBIC AND ANAEROBIC Blood Culture results may not be optimal due to an inadequate volume of blood received in culture bottles   Culture   Final    NO GROWTH 5 DAYS Performed at [facility], [address] [place], [place] [address]    Report Status 10/20/2019 FINAL  Final  SARS Coronavirus 2 by RT PCR (hospital order, performed in [place] hospital lab) Nasopharyngeal Nasopharyngeal Swab     Status: None   Collection Time: 10/15/19  4:29 PM   Specimen: Nasopharyngeal Swab  Result Value Ref Range Status   SARS Coronavirus 2 NEGATIVE NEGATIVE Final    Comment: (NOTE) If result is NEGATIVE SARS-CoV-2 target nucleic acids are NOT DETECTED. The SARS-CoV-2 RNA is generally detectable in upper and lower  respiratory specimens during the acute phase of infection. The lowest  concentration of SARS-CoV-2 viral copies this assay can detect is 250  copies / mL. A negative result does not preclude SARS-CoV-2 infection  and should not be used as the sole basis for treatment or other  patient management decisions.  A negative result may occur with  improper specimen collection / handling, submission of specimen other  than nasopharyngeal swab, presence of viral mutation(s) within the  areas targeted by this assay, and inadequate number of viral copies  (<250 copies / mL). A negative result must be combined with clinical  observations, patient history, and epidemiological information. If  result is POSITIVE SARS-CoV-2 target nucleic acids are DETECTED. The SARS-CoV-2 RNA is generally detectable in upper and lower  respiratory specimens dur ing the acute phase of infection.  Positive  results are indicative of active infection with SARS-CoV-2.  Clinical  correlation with patient history and other diagnostic information is  necessary to determine patient infection status.  Positive results do  not rule out bacterial infection or co-infection with other viruses. If result is PRESUMPTIVE POSTIVE SARS-CoV-2 nucleic acids MAY BE PRESENT.   A presumptive positive result was obtained on the submitted specimen  and confirmed on repeat testing.  While 2019 novel coronavirus  (SARS-CoV-2) nucleic acids may be present in the submitted sample  additional confirmatory testing may be necessary for epidemiological  and / or clinical management purposes  to differentiate between  SARS-CoV-2 and other Sarbecovirus currently known to infect humans.  If clinically indicated additional testing with an alternate test  methodology [phone]) is advised. The SARS-CoV-2 RNA is generally  detectable in upper and lower respiratory sp ecimens during the acute  phase of infection. The expected result is Negative. Fact Sheet for Patients:  StrictlyIdeas.no Fact Sheet for Healthcare Providers: BankingDealers.co.za This test is not yet approved or cleared by the [place] FDA and has been authorized for detection and/or diagnosis of SARS-CoV-2 by FDA under an Emergency Use Authorization (EUA).  This EUA will remain in effect (meaning this test can be used) for the duration of the COVID-19 declaration under Section 564(b)(1) of the Act, 21 U.S.C. section 360bbb-3(b)(1), unless the authorization is terminated or revoked sooner. Performed at [facility], [address] [place], [place] [address]   GI pathogen panel by PCR, stool      Status: None   Collection Time: 10/15/19 10:00 PM   Specimen: Stool  Result Value Ref Range Status   Plesiomonas shigelloides NOT DETECTED NOT DETECTED Final   Yersinia enterocolitica NOT DETECTED NOT DETECTED Final   Vibrio NOT DETECTED NOT DETECTED Final   Enteropathogenic E coli NOT DETECTED NOT DETECTED Final   E coli (ETEC) LT/ST NOT DETECTED NOT DETECTED Final   E coli [id number] by PCR Not applicable NOT DETECTED Final   Cryptosporidium by PCR NOT DETECTED NOT DETECTED Final   Entamoeba histolytica NOT DETECTED NOT DETECTED Final   Adenovirus F 40/41 NOT DETECTED NOT DETECTED Final   Norovirus GI/GII NOT DETECTED NOT DETECTED Final   Sapovirus NOT DETECTED NOT DETECTED Final    Comment: (NOTE) Performed At: [place], [place] [id number] Rush Farmer MD [id number]    Vibrio cholerae NOT DETECTED NOT DETECTED Final   Campylobacter by PCR NOT DETECTED NOT DETECTED Final   Salmonella by PCR NOT DETECTED NOT DETECTED Final   E coli (STEC) NOT DETECTED NOT DETECTED Final   Enteroaggregative E coli NOT DETECTED NOT DETECTED Final   Shigella by PCR NOT DETECTED NOT DETECTED Final   Cyclospora cayetanensis NOT DETECTED NOT DETECTED Final   Astrovirus NOT DETECTED NOT DETECTED Final   G lamblia by PCR NOT DETECTED NOT DETECTED Final   Rotavirus A by PCR NOT DETECTED NOT DETECTED Final  C Difficile Quick Screen w PCR reflex     Status: Abnormal   Collection Time: 10/15/19 10:00 PM   Specimen: Stool  Result Value Ref Range Status   C Diff antigen POSITIVE (A) NEGATIVE Final   C Diff toxin NEGATIVE NEGATIVE Final   C Diff interpretation Results are indeterminate. See PCR results.  Final    Comment: Performed at [facility], [address] [place], [place] [address]  C. Diff by PCR, Reflexed     Status: None   Collection Time: 10/15/19 10:00 PM  Result Value Ref Range Status   Toxigenic C. Difficile by PCR NEGATIVE NEGATIVE Final     Comment: Patient is colonized with non toxigenic C. difficile. May not need treatment unless significant symptoms are present. Performed at [facility], [address] [place], [place] [address]   MRSA PCR Screening     Status: None   Collection Time: 10/16/19  3:25 AM   Specimen: Nasal Mucosa; Nasopharyngeal  Result Value Ref Range Status   MRSA by PCR NEGATIVE NEGATIVE Final    Comment:        The GeneXpert MRSA Assay (FDA approved for NASAL specimens only), is one component of a comprehensive MRSA colonization surveillance program. It is not intended to diagnose MRSA infection nor to guide or monitor treatment for MRSA infections. Performed at [facility], [place] [address], [place],  [address]   Culture, blood (routine x 2)     Status: None   Collection Time: 10/16/19  5:55 AM   Specimen: BLOOD RIGHT ARM  Result Value Ref Range Status   Specimen Description BLOOD RIGHT ARM  Final   Special Requests   Final    BOTTLES DRAWN AEROBIC AND ANAEROBIC Blood Culture results may not be optimal due to an inadequate volume of blood received in culture bottles   Culture   Final    NO GROWTH 5 DAYS Performed at [facility], [address], [place], [place] [address]    Report Status 10/21/2019 FINAL  Final  Culture, blood (routine x 2)     Status: None   Collection Time: 10/16/19  5:57 AM   Specimen: BLOOD LEFT ARM  Result Value Ref Range Status   Specimen Description BLOOD LEFT ARM  Final   Special Requests   Final    BOTTLES DRAWN AEROBIC AND ANAEROBIC Blood Culture results may not be optimal due to an inadequate volume of blood received in culture bottles   Culture   Final    NO GROWTH 5 DAYS Performed at [facility], [place] [address] [place], [place] [address]    Report Status 10/21/2019 FINAL  Final  Culture, blood (Routine X 2) w Reflex to ID Panel     Status: None (Preliminary result)   Collection Time: 10/18/19 10:39 AM   Specimen:  BLOOD  Result Value Ref Range Status   Specimen Description BLOOD RIGHT ANTECUBITAL  Final   Special Requests   Final    BOTTLES DRAWN AEROBIC AND ANAEROBIC Blood Culture adequate volume   Culture   Final    NO GROWTH 3 DAYS Performed at [facility], [place] [address], [place], [place]  [address]    Report Status PENDING  Incomplete  Culture, blood (Routine X 2) w Reflex to ID Panel     Status: None (Preliminary result)   Collection Time: 10/18/19 10:39 AM   Specimen: BLOOD  Result Value Ref Range Status   Specimen Description BLOOD BLOOD RIGHT HAND  Final   Special Requests   Final    BOTTLES DRAWN AEROBIC AND ANAEROBIC Blood Culture adequate volume   Culture   Final    NO GROWTH 3 DAYS Performed at [facility], [place] [address], [place], [place] [address]    Report Status PENDING  Incomplete         Radiology Studies: No results found.      Scheduled Meds: . Chlorhexidine Gluconate Cloth  6 each Topical Daily  . docusate sodium  100 mg Oral Daily  . heparin  5,000 Units Subcutaneous Q8H  . magnesium oxide  400 mg Oral BID  . multivitamin with minerals  1 tablet Oral Daily   Continuous Infusions: . sodium chloride Stopped (10/19/19 0946)  . lactated ringers 100 mL/hr at 10/21/19 0637  . piperacillin-tazobactam (ZOSYN)  IV 3.375 g (10/21/19 0123)     LOS: 5 days    Time spent: 25 minutes     Barb Merino, MD [facility] Pager [phone]

## 2019-10-21 NOTE — CV Procedure (Signed)
TEE     Pt sedated by anesthesia with propofol TEE probe advanced to mid esophagus without difficulty    MV normal   No MR AV normal  NO AI PV normal  No PI TV is minimally thickened  Trace TR  LA, LAA without masses   LVEF and RVEF are normal  Mild fixed atherosclerotic plaquing in aorta  No PFO as tested by color doppler only     Procedure without complcation  Full report to follow  [name] MD

## 2019-10-21 NOTE — Progress Notes (Signed)
  Echocardiogram Echocardiogram Transesophageal has been performed.  Charles Espinoza 10/21/2019, 9:15 AM

## 2019-10-21 NOTE — Addendum Note (Signed)
Addendum  created 10/21/19 1154 by Duane Boston, MD   Clinical Note Signed

## 2019-10-21 NOTE — Evaluation (Signed)
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   Clinical Impression  Patient presents with generalized weakness/deconditioning, impaired balance and impaired mobility s/p above. Pt reports being independent PTA and doing his own ADLs at SNF. Today, pt tolerated transfers and gait training with supervision-Min A for balance/safety. Declined using RW for support. Anticipate with increased activity, pt's strength and mobilty will improve while in the hospital. BP soft but stable, asymptomatic. Recommend initial 24/7 supervision at home and HHPT. Will follow acutely to maximize independence and mobility prior to return home.     Follow Up Recommendations Home health PT;Supervision for mobility/OOB    Equipment Recommendations  None recommended by PT    Recommendations for Other Services       Precautions / Restrictions Precautions Precautions: Fall Precaution Comments: JP drain, abdominal incision, foley cath Restrictions Weight Bearing Restrictions: No      Mobility  Bed Mobility Overal bed mobility: Needs Assistance Bed Mobility: Supine to Sit;Sit to Supine     Supine to sit: Supervision Sit to supine: Supervision   General bed mobility comments: supervision for safety, but no physical assist required  Transfers Overall transfer level: Needs assistance Equipment used: None Transfers: Sit to/from Stand Sit to Stand: Min guard         General transfer comment: min guard to close supervision for basic transfers, mild unsteadiness  initially.  Ambulation/Gait Ambulation/Gait assistance: Min assist;Min guard Gait Distance (Feet): 250 Feet Assistive device: None Gait Pattern/deviations: Step-through pattern;Drifts right/left Gait velocity: decreased   General Gait Details: Slow, mildly unsteady gait with mild drifting noted requiring Min A at times, improved with increased distance. Soft BP but stable and asymptomatic.  Stairs            Wheelchair Mobility    Modified Rankin (Stroke Patients Only)       Balance Overall balance assessment: Needs assistance Sitting-balance support: No upper extremity supported;Feet supported Sitting balance-Leahy Scale: Good Sitting balance - Comments: Able to reach down and adjust socks without difficulty.   Standing balance support: No upper extremity supported;During functional activity Standing balance-Leahy Scale: Fair Standing balance comment: min guard for balance dynamically with moments of min A                             Pertinent Vitals/Pain Pain Assessment: No/denies pain    Home Living Family/patient expects to be discharged to:: Private residence Living Arrangements: Spouse/significant other;Other (Comment) Available Help at Discharge: Family;Available 24 hours/day Type of Home: House Home Access: Stairs to enter Entrance Stairs-Rails: Can reach both Entrance Stairs-Number of Steps: 2 Home Layout: One level Home Equipment: Shower seat;Grab bars - tub/shower Additional Comments: wife has a Programmer, multimedia, patient usually does not use these     Prior Function Level of Independence: Independent         Comments: reports living at [facility], was ambulating and completing ADls independently but plans to dc home from hospital      Hand Dominance   Dominant Hand: Right    Extremity/Trunk Assessment   Upper Extremity Assessment  Upper Extremity Assessment: Defer to OT evaluation    Lower Extremity Assessment Lower Extremity  Assessment: Generalized weakness(but functional)    Cervical / Trunk Assessment Cervical / Trunk Assessment: Kyphotic  Communication   Communication: No difficulties  Cognition Arousal/Alertness: Awake/alert Behavior During Therapy: WFL for tasks assessed/performed Overall Cognitive Status: Within Functional Limits for tasks assessed                                 General Comments: appears [facility] for tasks assessed.      General Comments General comments (skin integrity, edema, etc.): BP monitored throughout session; VSS     Exercises     Assessment/Plan    PT Assessment Patient needs continued PT services  PT Problem List Decreased strength;Decreased safety awareness;Decreased balance;Decreased mobility       PT Treatment Interventions DME instruction;Balance training;Gait training;Neuromuscular re-education;Stair training;Functional mobility training;Patient/family education;Therapeutic activities;Therapeutic exercise    PT Goals (Current goals can be found in the Care Plan section)  Acute Rehab PT Goals Patient Stated Goal: to get home  PT Goal Formulation: With patient Time For Goal Achievement: 11/04/19 Potential to Achieve Goals: Good    Frequency Min 3X/week   Barriers to discharge        Co-evaluation               AM-PAC PT "6 Clicks" Mobility  Outcome Measure Help needed turning from your back to your side while in a flat bed without using bedrails?: None Help needed moving from lying on your back to sitting on the side of a flat bed without using bedrails?: None Help needed moving to and from a bed to a chair (including a wheelchair)?: A Little Help needed standing up from a chair using your arms (e.g., wheelchair or bedside chair)?: A Little Help needed to walk in hospital room?: A Little Help needed climbing 3-5 steps with a railing? : A Little 6 Click Score: 20    End of Session Equipment Utilized During Treatment: Gait  belt Activity Tolerance: Patient tolerated treatment well Patient left: in bed;with call bell/phone within reach;with bed alarm set Nurse Communication: Mobility status PT Visit Diagnosis: Unsteadiness on feet (R26.81);Muscle weakness (generalized) (M62.81);Difficulty in walking, not elsewhere classified (R26.2)    Time: [id number] PT Time Calculation (min) (ACUTE ONLY): 19 min   Charges:   PT Evaluation $PT Eval Moderate Complexity: 1 Mod          Marisa Severin, PT, DPT Acute Rehabilitation Services Pager [phone] Office [phone]      Marguarite Arbour A Beonca Gibb 10/21/2019, 1:18 PM

## 2019-10-21 NOTE — Anesthesia Postprocedure Evaluation (Addendum)
Anesthesia Post Note  Patient: Charles Espinoza  Procedure(s) Performed: TRANSESOPHAGEAL ECHOCARDIOGRAM (TEE) (N/A )     Patient location during evaluation: Endoscopy Anesthesia Type: MAC Level of consciousness: awake and alert Pain management: pain level controlled Vital Signs Assessment: post-procedure vital signs reviewed and stable Respiratory status: spontaneous breathing and respiratory function stable Cardiovascular status: stable Postop Assessment: no apparent nausea or vomiting Anesthetic complications: no    Last Vitals:  Vitals:   10/21/19 1051 10/21/19 1133  BP: (!) 85/62   Pulse: 69   Resp: 18   Temp: 36.4 C   SpO2: 99% 100%    Last Pain:  Vitals:   10/21/19 1133  TempSrc:   PainSc: 0-No pain                 Saniyya Gau DANIEL

## 2019-10-21 NOTE — Progress Notes (Signed)
PT Cancellation Note  Patient Details Name: Leam Saye MRN: [id number] DOB: December 04, 1941   Cancelled Treatment:    Reason Eval/Treat Not Completed: Patient at procedure or test/unavailable pt off floor at TEE. Will follow.   Marguarite Arbour A Skyanne Welle 10/21/2019, 8:12 AM Wray Kearns, PT, DPT Acute Rehabilitation Services Pager [phone] Office [phone]

## 2019-10-21 NOTE — Interval H&P Note (Signed)
History and Physical Interval Note:  10/21/2019 8:34 AM  Charles Espinoza  has presented today for surgery, with the diagnosis of bacteremia.  The various methods of treatment have been discussed with the patient and family. After consideration of risks, benefits and other options for treatment, the patient has consented to  Procedure(s): TRANSESOPHAGEAL ECHOCARDIOGRAM (TEE) (N/A) as a surgical intervention.  The patient's history has been reviewed, patient examined, no change in status, stable for surgery.  I have reviewed the patient's chart and labs.  Questions were answered to the patient's satisfaction.     [name]

## 2019-10-21 NOTE — Anesthesia Procedure Notes (Signed)
Procedure Name: MAC Date/Time: 10/21/2019 8:45 AM Performed by: Candis Shine, CRNA Pre-anesthesia Checklist: Patient identified, Emergency Drugs available, Suction available, Patient being monitored and Timeout performed Patient Re-evaluated:Patient Re-evaluated prior to induction Oxygen Delivery Method: Simple face mask Dental Injury: Teeth and Oropharynx as per pre-operative assessment

## 2019-10-21 NOTE — Evaluation (Signed)
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    Clinical Impression   PTA patient was completing ADLs, mobility with independence at [facility] but reports plan to dc home from hospital when medically cleared.  Patient reports spouse can provide 24/7 support.  He was admitted for above and is limited by problem list below, including impaired balance, decreased activity tolerance.  He is able to complete UB ADls with setup assist, LB ADLs with min guard assist and transfers/mobility with min guard assist.  He will benefit from continued OT services while admitted and after dc at [facility] level in order to optimize independence and safety with ADLs/mobility.    BP: supine 94/61  (72); seated 84/60 (69); standing 91/66 (75)    Follow Up Recommendations  Home health OT;Supervision/Assistance - 24 hour    Equipment Recommendations  None recommended by OT    Recommendations for Other Services       Precautions / Restrictions Precautions Precautions: Fall Restrictions Weight Bearing Restrictions: No      Mobility Bed Mobility Overal bed mobility: Needs Assistance Bed Mobility: Supine to Sit;Sit to Supine     Supine to sit: Supervision Sit to supine: Supervision   General bed mobility comments: supervision for safety, but no physical assist required  Transfers Overall transfer level: Needs assistance Equipment used: None Transfers: Sit to/from Stand Sit to Stand: Min guard         General transfer comment: min guard to close supervision for basic  transfers     Balance Overall balance assessment: Needs assistance Sitting-balance support: No upper extremity supported;Feet supported Sitting balance-Leahy Scale: Good     Standing balance support: No upper extremity supported;During functional activity Standing balance-Leahy Scale: Fair Standing balance comment: min guard for balance dynamically                           ADL either performed or assessed with clinical judgement   ADL Overall ADL's : Needs assistance/impaired     Grooming: Min guard;Standing   Upper Body Bathing: Set up;Sitting   Lower Body Bathing: Min guard;Sit to/from stand   Upper Body Dressing : Supervision/safety;Sitting   Lower Body Dressing: Min guard;Sit to/from stand Lower Body Dressing Details (indicate cue type and reason): able to manage socks without difficulty seated, min guard sit to stand  Toilet Transfer: Min guard;Ambulation Toilet Transfer Details (indicate cue type and reason): simulated in room         Functional mobility during ADLs: Min guard General ADL Comments: pt limited by decreased activity tolerance and impaired balance     Vision   Vision Assessment?: No apparent visual deficits     Perception     Praxis      Pertinent Vitals/Pain Pain Assessment: No/denies pain     Hand Dominance Right   Extremity/Trunk Assessment Upper Extremity Assessment Upper Extremity Assessment: Generalized weakness   Lower Extremity Assessment Lower Extremity Assessment: Defer to PT evaluation   Cervical / Trunk Assessment Cervical / Trunk Assessment: Kyphotic   Communication Communication Communication: No difficulties   Cognition  Arousal/Alertness: Awake/alert Behavior During Therapy: WFL for tasks assessed/performed Overall Cognitive Status: Within Functional Limits for tasks assessed                                 General Comments: appears WFL for tasks assessed   General Comments  BP  monitored throughout session; VSS     Exercises     Shoulder Instructions      Home Living Family/patient expects to be discharged to:: Private residence Living Arrangements: Spouse/significant other;Other (Comment) Available Help at Discharge: Family;Available 24 hours/day Type of Home: House Home Access: Stairs to enter [employer] of Steps: 2 Entrance Stairs-Rails: Can reach both Home Layout: One level     Bathroom Shower/Tub: Teacher, early years/pre: Standard     Home Equipment: Shower seat;Grab bars - tub/shower   Additional Comments: wife has a Programmer, multimedia, patient usually does not use these       Prior Functioning/Environment Level of Independence: Independent        Comments: reports living at [facility], was ambulating and completing ADls independently but plans to dc home from hospital         OT Problem List: Decreased strength;Decreased activity tolerance;Impaired balance (sitting and/or standing);Decreased knowledge of use of DME or AE;Decreased knowledge of precautions;Cardiopulmonary status limiting activity      OT Treatment/Interventions: Self-care/ADL training;DME and/or AE instruction;Therapeutic exercise;Therapeutic activities;Balance training;Patient/family education    OT Goals(Current goals can be found in the care plan section) Acute Rehab OT Goals Patient Stated Goal: to get home  OT Goal Formulation: With patient Time For Goal Achievement: 11/04/19 Potential to Achieve Goals: Good  OT Frequency: Min 2X/week   Barriers to D/C:            Co-evaluation              AM-PAC OT "6 Clicks" Daily Activity     Outcome Measure Help from another person eating meals?: None Help from another person taking care of personal grooming?: A Little Help from another person toileting, which includes using toliet, bedpan, or urinal?: A Little Help from another person bathing (including washing, rinsing, drying)?: A  Little Help from another person to put on and taking off regular upper body clothing?: A Little Help from another person to put on and taking off regular lower body clothing?: A Little 6 Click Score: 19   End of Session Equipment Utilized During Treatment: Gait belt Nurse Communication: Mobility status  Activity Tolerance: Patient tolerated treatment well Patient left: in bed;with call bell/phone within reach  OT Visit Diagnosis: Muscle weakness (generalized) (M62.81);Unsteadiness on feet (R26.81)                Time: [id number] OT Time Calculation (min): 17 min Charges:  OT General Charges $OT Visit: 1 Visit OT Evaluation $OT Eval Moderate Complexity: [place], OT Acute Rehabilitation Services Pager [phone] Office [phone]   Delight Stare 10/21/2019, 1:06 PM

## 2019-10-21 NOTE — Progress Notes (Signed)
Transported to endoscopy by wheelchair awake and alert. 

## 2019-10-21 NOTE — Progress Notes (Signed)
OT Cancellation Note  Patient Details Name: Charles Espinoza MRN: [id number] DOB: 12/07/1941   Cancelled Treatment:    Reason Eval/Treat Not Completed: Patient at procedure or test/ unavailable; pt taken to TEE per RN.  Will follow and see as able.  Delight Stare, OT Acute Rehabilitation Services Pager [phone] Office [phone]   Delight Stare 10/21/2019, 8:07 AM

## 2019-10-21 NOTE — Progress Notes (Signed)
Back from endoscopy by wheelchair, noted urine from the foley cath with bright red color, NUrse from endo claimed that urine been bloody, MD aware. Continue to monitor.

## 2019-10-21 NOTE — Progress Notes (Signed)
[facility]  Date of Admission:  10/16/2019      Total days of antibiotics 7  Day 3 piperacillin-tazobactam             ASSESSMENT: Charles Espinoza is a 78 y.o. male with metastatic prostate/colon cancer here with E faecalis bacteremia. TEE today revealed no vegetations or concern for endocarditis. LVEF normal. Likely secondary bacteremia from urinary source vs un-drained rectal abscess vs R pyelonephritis (stranding on CT noted).   Will continue zosyn for now while he is here but likely does not need longer than 2 more days of pseudomonas coverage. Plan for possible discharge to complete 14 days antibiotics from negative culture 10/30 - Augmentin would be a good choice to cover enterococcus and other possible GNR/anaerobes that may be in un drained rectal abscess.   Palliative care medicine team is following as well for support given complex medical conditions.   Medication monitoring = He has tolerated all antibiotics without side effect thusfar. Creatinine stable. WBC normal.    PLAN: 1. Continue piperacillin-tazobactam for now while hospitalized  2. Plan on switching to PO augmentin at discharge through Nov 13 3. 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 No complaints/concerns. Happy to be out of ICU.   Review of Systems: Review of Systems  Constitutional: Negative for chills, fever, malaise/fatigue and weight loss.    Respiratory: Negative for cough, sputum production and shortness of breath.   Cardiovascular: Negative.   Gastrointestinal: Negative for abdominal pain, diarrhea and vomiting.  Musculoskeletal: Negative for joint pain, myalgias and neck pain.  Skin: Negative for rash.  Neurological: Negative for weakness and headaches.    No Active Allergies  OBJECTIVE: Vitals:   10/21/19 0728 10/21/19 0817 10/21/19 0908 10/21/19 0920  BP: 92/62 (!) 94/56 (!) 91/48 (!) 89/51  Pulse: 74 73 69 73  Resp: (!) 25 12 13  (!) 24  Temp: 97.8 F (36.6 C) 98.2 F (36.8 C) 98.6 F (37 C)   TempSrc: Oral Oral Temporal   SpO2: 100% 99% 100% 99%  Weight:      Height:       Body mass index is 20.45 kg/m.  Physical Exam Constitutional:      Comments: Sitting upright comfortably in bed  HENT:     Mouth/Throat:     Mouth: No oral lesions.     Dentition: Normal dentition. No dental caries.  Eyes:     General: No scleral icterus. Neck:     Comments: L IJ clean and dry Cardiovascular:     Rate and Rhythm: Normal rate and regular rhythm.     Heart sounds: Normal heart sounds.  Pulmonary:     Effort: Pulmonary effort is normal.     Breath sounds: Normal breath sounds.  Abdominal:     General: There is no distension.  Palpations: Abdomen is soft.     Tenderness: There is no abdominal tenderness.     Comments: Ostomy with brown liquid stool. +large flatus with inflated bag.   Lymphadenopathy:     Cervical: No cervical adenopathy.  Skin:    General: Skin is warm and dry.     Findings: No rash.  Neurological:     Mental Status: He is alert and oriented to person, place, and time.     Lab Results Lab Results  Component Value Date   WBC 8.1 10/21/2019   HGB 8.3 (L) 10/21/2019   HCT 25.3 (L) 10/21/2019   MCV 94.4 10/21/2019   PLT 261 10/21/2019    Lab Results  Component Value Date   CREATININE 0.85 10/21/2019   BUN 6 (L) 10/21/2019   NA 141 10/21/2019   K 4.0 10/21/2019   CL 112 (H)  10/21/2019   CO2 24 10/21/2019    Lab Results  Component Value Date   ALT 19 10/15/2019   AST 36 10/15/2019   ALKPHOS 119 10/15/2019   BILITOT 0.4 10/15/2019     Microbiology: Recent Results (from the past 240 hour(s))  Urine culture     Status: Abnormal   Collection Time: 10/15/19  4:25 PM   Specimen: Urine, Catheterized  Result Value Ref Range Status   Specimen Description   Final    URINE, CATHETERIZED Performed at [facility], [address] [place], [place] [address]    Special Requests   Final    NONE Performed at [facility], [address], [place], [place] [address]    Culture (A)  Final    70,000 COLONIES/mL PSEUDOMONAS AERUGINOSA 80,000 COLONIES/mL ENTEROCOCCUS FAECALIS    Report Status 10/18/2019 FINAL  Final   Organism ID, Bacteria PSEUDOMONAS AERUGINOSA (A)  Final   Organism ID, Bacteria ENTEROCOCCUS FAECALIS (A)  Final      Susceptibility   Enterococcus faecalis - MIC*    AMPICILLIN <=2 SENSITIVE Sensitive     LEVOFLOXACIN 0.5 SENSITIVE Sensitive     NITROFURANTOIN <=16 SENSITIVE Sensitive     VANCOMYCIN 1 SENSITIVE Sensitive     * 80,000 COLONIES/mL ENTEROCOCCUS FAECALIS   Pseudomonas aeruginosa - MIC*    CEFTAZIDIME 4 SENSITIVE Sensitive     CIPROFLOXACIN <=0.25 SENSITIVE Sensitive     GENTAMICIN 8 INTERMEDIATE Intermediate     IMIPENEM 2 SENSITIVE Sensitive     PIP/TAZO 16 SENSITIVE Sensitive     CEFEPIME 8 SENSITIVE Sensitive     * 70,000 COLONIES/mL PSEUDOMONAS AERUGINOSA  Blood culture (routine x 2)     Status: Abnormal   Collection Time: 10/15/19  4:25 PM   Specimen: BLOOD  Result Value Ref Range Status   Specimen Description   Final    BLOOD LEFT ANTECUBITAL Performed at [facility], [address] [place], [place] [address]    Special Requests   Final    BOTTLES DRAWN AEROBIC AND ANAEROBIC Blood Culture results may not be optimal due to an inadequate volume of blood received in culture  bottles Performed at [facility], [address] [place], [place] [address]    Culture  Setup Time   Final    IN BOTH AEROBIC AND ANAEROBIC BOTTLES GRAM POSITIVE COCCI CRITICAL RESULT CALLED TO, READ BACK BY AND VERIFIED WITH: SCOTT HALL AT [id number] ON 10/16/2019 JJB Performed at [facility], [address] [place], [place] [address]    Culture (A)  Final    ENTEROCOCCUS FAECALIS  STAPHYLOCOCCUS SPECIES (COAGULASE NEGATIVE) THE SIGNIFICANCE OF ISOLATING THIS ORGANISM FROM A SINGLE SET OF BLOOD CULTURES WHEN MULTIPLE SETS ARE DRAWN IS UNCERTAIN. PLEASE NOTIFY THE MICROBIOLOGY DEPARTMENT WITHIN ONE WEEK IF SPECIATION AND SENSITIVITIES ARE REQUIRED. CRITICAL RESULT CALLED TO, READ BACK BY AND VERIFIED WITH: PHARMD Cleotis Lema AUMEISTER [id number] FCP Performed at [facility], [place] [address] [place], [place] [address]    Report Status 10/20/2019 FINAL  Final   Organism ID, Bacteria ENTEROCOCCUS FAECALIS  Final      Susceptibility   Enterococcus faecalis - MIC*    AMPICILLIN <=2 SENSITIVE Sensitive     VANCOMYCIN 1 SENSITIVE Sensitive     GENTAMICIN SYNERGY SENSITIVE Sensitive     * ENTEROCOCCUS FAECALIS  Blood culture (routine x 2)     Status: None   Collection Time: 10/15/19  4:29 PM   Specimen: BLOOD  Result Value Ref Range Status   Specimen Description BLOOD BLOOD RIGHT FOREARM  Final   Special Requests   Final    BOTTLES DRAWN AEROBIC AND ANAEROBIC Blood Culture results may not be optimal due to an inadequate volume of blood received in culture bottles   Culture   Final    NO GROWTH 5 DAYS Performed at [facility], [address], [place], [place] [address]    Report Status 10/20/2019 FINAL  Final  SARS Coronavirus 2 by RT PCR (hospital order, performed in [facility] hospital lab) Nasopharyngeal Nasopharyngeal Swab     Status: None   Collection Time: 10/15/19  4:29 PM   Specimen: Nasopharyngeal Swab  Result Value Ref Range Status   SARS  Coronavirus 2 NEGATIVE NEGATIVE Final    Comment: (NOTE) If result is NEGATIVE SARS-CoV-2 target nucleic acids are NOT DETECTED. The SARS-CoV-2 RNA is generally detectable in upper and lower  respiratory specimens during the acute phase of infection. The lowest  concentration of SARS-CoV-2 viral copies this assay can detect is 250  copies / mL. A negative result does not preclude SARS-CoV-2 infection  and should not be used as the sole basis for treatment or other  patient management decisions.  A negative result may occur with  improper specimen collection / handling, submission of specimen other  than nasopharyngeal swab, presence of viral mutation(s) within the  areas targeted by this assay, and inadequate number of viral copies  (<250 copies / mL). A negative result must be combined with clinical  observations, patient history, and epidemiological information. If result is POSITIVE SARS-CoV-2 target nucleic acids are DETECTED. The SARS-CoV-2 RNA is generally detectable in upper and lower  respiratory specimens dur ing the acute phase of infection.  Positive  results are indicative of active infection with SARS-CoV-2.  Clinical  correlation with patient history and other diagnostic information is  necessary to determine patient infection status.  Positive results do  not rule out bacterial infection or co-infection with other viruses. If result is PRESUMPTIVE POSTIVE SARS-CoV-2 nucleic acids MAY BE PRESENT.   A presumptive positive result was obtained on the submitted specimen  and confirmed on repeat testing.  While 2019 novel coronavirus  (SARS-CoV-2) nucleic acids may be present in the submitted sample  additional confirmatory testing may be necessary for epidemiological  and / or clinical management purposes  to differentiate between  SARS-CoV-2 and other Sarbecovirus currently known to infect humans.  If clinically indicated additional testing with an alternate test   methodology [id number]) is advised. The SARS-CoV-2 RNA is generally  detectable in upper  and lower respiratory sp ecimens during the acute  phase of infection. The expected result is Negative. Fact Sheet for Patients:  StrictlyIdeas.no Fact Sheet for Healthcare Providers: BankingDealers.co.za This test is not yet approved or cleared by the [place] FDA and has been authorized for detection and/or diagnosis of SARS-CoV-2 by FDA under an Emergency Use Authorization (EUA).  This EUA will remain in effect (meaning this test can be used) for the duration of the COVID-19 declaration under Section 564(b)(1) of the Act, 21 U.S.C. section 360bbb-3(b)(1), unless the authorization is terminated or revoked sooner. Performed at [facility], [address] [place], [place] [address]   GI pathogen panel by PCR, stool     Status: None   Collection Time: 10/15/19 10:00 PM   Specimen: Stool  Result Value Ref Range Status   Plesiomonas shigelloides NOT DETECTED NOT DETECTED Final   Yersinia enterocolitica NOT DETECTED NOT DETECTED Final   Vibrio NOT DETECTED NOT DETECTED Final   Enteropathogenic E coli NOT DETECTED NOT DETECTED Final   E coli (ETEC) LT/ST NOT DETECTED NOT DETECTED Final   E coli [id number] by PCR Not applicable NOT DETECTED Final   Cryptosporidium by PCR NOT DETECTED NOT DETECTED Final   Entamoeba histolytica NOT DETECTED NOT DETECTED Final   Adenovirus F 40/41 NOT DETECTED NOT DETECTED Final   Norovirus GI/GII NOT DETECTED NOT DETECTED Final   Sapovirus NOT DETECTED NOT DETECTED Final    Comment: (NOTE) Performed At: [facility] [place], [place] [id number] Rush Farmer MD [id number]    Vibrio cholerae NOT DETECTED NOT DETECTED Final   Campylobacter by PCR NOT DETECTED NOT DETECTED Final   Salmonella by PCR NOT DETECTED NOT DETECTED Final   E coli (STEC) NOT DETECTED NOT DETECTED Final    Enteroaggregative E coli NOT DETECTED NOT DETECTED Final   Shigella by PCR NOT DETECTED NOT DETECTED Final   Cyclospora cayetanensis NOT DETECTED NOT DETECTED Final   Astrovirus NOT DETECTED NOT DETECTED Final   G lamblia by PCR NOT DETECTED NOT DETECTED Final   Rotavirus A by PCR NOT DETECTED NOT DETECTED Final  C Difficile Quick Screen w PCR reflex     Status: Abnormal   Collection Time: 10/15/19 10:00 PM   Specimen: Stool  Result Value Ref Range Status   C Diff antigen POSITIVE (A) NEGATIVE Final   C Diff toxin NEGATIVE NEGATIVE Final   C Diff interpretation Results are indeterminate. See PCR results.  Final    Comment: Performed at [facility], [address] [place], [place] [address]  C. Diff by PCR, Reflexed     Status: None   Collection Time: 10/15/19 10:00 PM  Result Value Ref Range Status   Toxigenic C. Difficile by PCR NEGATIVE NEGATIVE Final    Comment: Patient is colonized with non toxigenic C. difficile. May not need treatment unless significant symptoms are present. Performed at [facility], [address] [place], [place] [address]   MRSA PCR Screening     Status: None   Collection Time: 10/16/19  3:25 AM   Specimen: Nasal Mucosa; Nasopharyngeal  Result Value Ref Range Status   MRSA by PCR NEGATIVE NEGATIVE Final    Comment:        The GeneXpert MRSA Assay (FDA approved for NASAL specimens only), is one component of a comprehensive MRSA colonization surveillance program. It is not intended to diagnose MRSA infection nor to guide or monitor treatment for MRSA infections. Performed at  [facility], [place] [address] [place], [place] [address]   Culture, blood (routine x 2)     Status: None   Collection Time: 10/16/19  5:55 AM   Specimen: BLOOD RIGHT ARM  Result Value Ref Range Status   Specimen Description BLOOD RIGHT ARM  Final   Special Requests   Final    BOTTLES DRAWN AEROBIC AND ANAEROBIC Blood Culture results may not be  optimal due to an inadequate volume of blood received in culture bottles   Culture   Final    NO GROWTH 5 DAYS Performed at [facility], [place] [address], [place], [place] [address]    Report Status 10/21/2019 FINAL  Final  Culture, blood (routine x 2)     Status: None   Collection Time: 10/16/19  5:57 AM   Specimen: BLOOD LEFT ARM  Result Value Ref Range Status   Specimen Description BLOOD LEFT ARM  Final   Special Requests   Final    BOTTLES DRAWN AEROBIC AND ANAEROBIC Blood Culture results may not be optimal due to an inadequate volume of blood received in culture bottles   Culture   Final    NO GROWTH 5 DAYS Performed at [facility], [place] [address], [place], [place] [address]    Report Status 10/21/2019 FINAL  Final  Culture, blood (Routine X 2) w Reflex to ID Panel     Status: None (Preliminary result)   Collection Time: 10/18/19 10:39 AM   Specimen: BLOOD  Result Value Ref Range Status   Specimen Description BLOOD RIGHT ANTECUBITAL  Final   Special Requests   Final    BOTTLES DRAWN AEROBIC AND ANAEROBIC Blood Culture adequate volume   Culture   Final    NO GROWTH 3 DAYS Performed at [facility], [place] [address] [place], [place] [address]    Report Status PENDING  Incomplete  Culture, blood (Routine X 2) w Reflex to ID Panel     Status: None (Preliminary result)   Collection Time: 10/18/19 10:39 AM   Specimen: BLOOD  Result Value Ref Range Status   Specimen Description BLOOD BLOOD RIGHT HAND  Final   Special Requests   Final    BOTTLES DRAWN AEROBIC AND ANAEROBIC Blood Culture adequate volume   Culture   Final    NO GROWTH 3 DAYS Performed at [facility], [place] [address] [place],  [address]    Report Status PENDING  Incomplete    Janene Madeira, MSN, NP-C [facility] Infectious Disease [email or web address] Pager: [phone] Office: [phone] [place]: [phone]

## 2019-10-21 NOTE — Plan of Care (Signed)

## 2019-10-22 LAB — CBC WITH DIFFERENTIAL/PLATELET
Abs Immature Granulocytes: 0.04 10*3/uL (ref 0.00–0.07)
Basophils Absolute: 0 10*3/uL (ref 0.0–0.1)
Basophils Relative: 0 %
Eosinophils Absolute: 0.1 10*3/uL (ref 0.0–0.5)
Eosinophils Relative: 1 %
HCT: 24.1 % — ABNORMAL LOW (ref 39.0–52.0)
Hemoglobin: 7.5 g/dL — ABNORMAL LOW (ref 13.0–17.0)
Immature Granulocytes: 1 %
Lymphocytes Relative: 17 %
Lymphs Abs: 1 10*3/uL (ref 0.7–4.0)
MCH: 30.4 pg (ref 26.0–34.0)
MCHC: 31.1 g/dL (ref 30.0–36.0)
MCV: 97.6 fL (ref 80.0–100.0)
Monocytes Absolute: 0.4 10*3/uL (ref 0.1–1.0)
Monocytes Relative: 7 %
Neutro Abs: 4.5 10*3/uL (ref 1.7–7.7)
Neutrophils Relative %: 74 %
Platelets: 273 10*3/uL (ref 150–400)
RBC: 2.47 MIL/uL — ABNORMAL LOW (ref 4.22–5.81)
RDW: 14.7 % (ref 11.5–15.5)
WBC: 6 10*3/uL (ref 4.0–10.5)
nRBC: 0 % (ref 0.0–0.2)

## 2019-10-22 LAB — BASIC METABOLIC PANEL
Anion gap: 6 (ref 5–15)
BUN: <5 mg/dL — ABNORMAL LOW (ref 8–23)
CO2: 25 mmol/L (ref 22–32)
Calcium: 7.5 mg/dL — ABNORMAL LOW (ref 8.9–10.3)
Chloride: 109 mmol/L (ref 98–111)
Creatinine, Ser: 0.9 mg/dL (ref 0.61–1.24)
GFR calc Af Amer: >60 mL/min (ref >60)
GFR calc non Af Amer: >60 mL/min (ref >60)
Glucose, Bld: 88 mg/dL (ref 70–99)
Potassium: 3.8 mmol/L (ref 3.5–5.1)
Sodium: 140 mmol/L (ref 135–145)

## 2019-10-22 LAB — PHOSPHORUS: Phosphorus: 2.2 mg/dL — ABNORMAL LOW (ref 2.5–4.6)

## 2019-10-22 LAB — MAGNESIUM: Magnesium: 1.7 mg/dL (ref 1.7–2.4)

## 2019-10-22 MED ORDER — POTASSIUM & SODIUM PHOSPHATES 280-160-250 MG PO PACK
1.0000 | PACK | Freq: Three times a day (TID) | ORAL | Status: DC
  Administered 2019-10-22 – 2019-10-24 (×10): 1 via ORAL
  Filled 2019-10-22 (×14): qty 1

## 2019-10-22 MED ORDER — MAGNESIUM SULFATE 2 GM/50ML IV SOLN
2.0000 g | Freq: Once | INTRAVENOUS | Status: AC
  Administered 2019-10-22: 2 g via INTRAVENOUS
  Filled 2019-10-22: qty 50

## 2019-10-22 NOTE — Progress Notes (Signed)
[facility]  Date of Admission:  10/16/2019      Total days of antibiotics 8  Day 4 piperacillin-tazobactam             ASSESSMENT: Charles Espinoza is a 78 y.o. male with metastatic prostate/colon cancer here with E faecalis bacteremia. TEE today revealed no vegetations or concern for endocarditis. LVEF normal. Likely secondary bacteremia from urinary source vs un-drained rectal abscess vs R pyelonephritis (stranding on CT noted).   Will finish out with Augmentin - please send home 5 more days at discharge.   Medication monitoring = He has tolerated all antibiotics without side effect thusfar. Creatinine stable. WBC normal   CDiff Antigen + Toxin - = would be interpreted as colonization and not active infection. No treatment indicated.    PLAN: 1. Will discharge on augmentin BID for a total of 5 more days to get 10 days total since clearance of blood.  2. Outpatient follow up with surgery / oncology team  Will sign off.    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 Chlorhexidine Gluconate Cloth  6 each Topical Daily  . docusate sodium  100 mg Oral Daily  . heparin  5,000 Units Subcutaneous Q8H  . magnesium oxide  400 mg Oral BID  . multivitamin with minerals  1 tablet Oral Daily    SUBJECTIVE: Feeling well today. No complaints/concerns. Ready to go home today.   Review of Systems: Review of Systems  Constitutional: Negative for chills, fever, malaise/fatigue and weight loss.  Respiratory: Negative for cough, sputum production and shortness of breath.   Cardiovascular: Negative.   Gastrointestinal: Negative for abdominal pain, diarrhea and vomiting.  Musculoskeletal: Negative for joint pain, myalgias and neck pain.  Skin: Negative for rash.   Neurological: Negative for weakness and headaches.    No Active Allergies  OBJECTIVE: Vitals:   10/21/19 2349 10/22/19 0340 10/22/19 0517 10/22/19 0832  BP: (!) 86/48 (!) 90/56  (!) 84/53  Pulse: 67 74  77  Resp: 18 (!) 21  (!) 23  Temp: 97.7 F (36.5 C) 97.7 F (36.5 C)  (!) 97.3 F (36.3 C)  TempSrc: Oral Oral  Oral  SpO2: 100% 98%  98%  Weight:   60 kg   Height:       Body mass index is 20.12 kg/m.  Physical Exam Constitutional:      Comments: Sitting upright comfortably in bed  HENT:     Mouth/Throat:     Mouth: No oral lesions.     Dentition: Normal dentition. No dental caries.  Eyes:     General: No scleral icterus. Neck:     Comments: L IJ clean and dry Cardiovascular:     Rate and Rhythm: Normal rate and regular rhythm.     Heart sounds: Normal heart sounds.  Pulmonary:     Effort: Pulmonary effort is normal.     Breath sounds: Normal breath sounds.  Abdominal:     General: There is no distension.     Palpations: Abdomen is soft.     Tenderness: There is no abdominal tenderness.     Comments: Ostomy with brown liquid stool. +large flatus with inflated bag.   Lymphadenopathy:     Cervical:  No cervical adenopathy.  Skin:    General: Skin is warm and dry.     Findings: No rash.  Neurological:     Mental Status: He is alert and oriented to person, place, and time.     Lab Results Lab Results  Component Value Date   WBC 6.0 10/22/2019   HGB 7.5 (L) 10/22/2019   HCT 24.1 (L) 10/22/2019   MCV 97.6 10/22/2019   PLT 273 10/22/2019    Lab Results  Component Value Date   CREATININE 0.90 10/22/2019   BUN <5 (L) 10/22/2019   NA 140 10/22/2019   K 3.8 10/22/2019   CL 109 10/22/2019   CO2 25 10/22/2019    Lab Results  Component Value Date   ALT 19 10/15/2019   AST 36 10/15/2019   ALKPHOS 119 10/15/2019   BILITOT 0.4 10/15/2019     Microbiology: Recent Results (from the past 240 hour(s))  Urine culture     Status: Abnormal   Collection  Time: 10/15/19  4:25 PM   Specimen: Urine, Catheterized  Result Value Ref Range Status   Specimen Description   Final    URINE, CATHETERIZED Performed at [facility], [address] [place], [place] [address]    Special Requests   Final    NONE Performed at [facility], [address] [place], [place] [address]    Culture (A)  Final    70,000 COLONIES/mL PSEUDOMONAS AERUGINOSA 80,000 COLONIES/mL ENTEROCOCCUS FAECALIS    Report Status 10/18/2019 FINAL  Final   Organism ID, Bacteria PSEUDOMONAS AERUGINOSA (A)  Final   Organism ID, Bacteria ENTEROCOCCUS FAECALIS (A)  Final      Susceptibility   Enterococcus faecalis - MIC*    AMPICILLIN <=2 SENSITIVE Sensitive     LEVOFLOXACIN 0.5 SENSITIVE Sensitive     NITROFURANTOIN <=16 SENSITIVE Sensitive     VANCOMYCIN 1 SENSITIVE Sensitive     * 80,000 COLONIES/mL ENTEROCOCCUS FAECALIS   Pseudomonas aeruginosa - MIC*    CEFTAZIDIME 4 SENSITIVE Sensitive     CIPROFLOXACIN <=0.25 SENSITIVE Sensitive     GENTAMICIN 8 INTERMEDIATE Intermediate     IMIPENEM 2 SENSITIVE Sensitive     PIP/TAZO 16 SENSITIVE Sensitive     CEFEPIME 8 SENSITIVE Sensitive     * 70,000 COLONIES/mL PSEUDOMONAS AERUGINOSA  Blood culture (routine x 2)     Status: Abnormal   Collection Time: 10/15/19  4:25 PM   Specimen: BLOOD  Result Value Ref Range Status   Specimen Description   Final    BLOOD LEFT ANTECUBITAL Performed at [facility], [address] [place], [place] [address]    Special Requests   Final    BOTTLES DRAWN AEROBIC AND ANAEROBIC Blood Culture results may not be optimal due to an inadequate volume of blood received in culture bottles Performed at [facility], [address] [place], [place] [address]    Culture  Setup Time   Final    IN BOTH AEROBIC AND ANAEROBIC BOTTLES GRAM POSITIVE COCCI CRITICAL RESULT CALLED TO, READ BACK BY AND VERIFIED WITH: SCOTT HALL AT [phone] ON 10/16/2019 JJB Performed at  [facility], [address] [place], [place] [address]    Culture (A)  Final    ENTEROCOCCUS FAECALIS STAPHYLOCOCCUS SPECIES (COAGULASE NEGATIVE) THE SIGNIFICANCE OF ISOLATING THIS ORGANISM FROM A SINGLE SET OF BLOOD CULTURES WHEN MULTIPLE SETS ARE DRAWN IS UNCERTAIN. PLEASE NOTIFY THE MICROBIOLOGY DEPARTMENT WITHIN ONE WEEK IF SPECIATION AND SENSITIVITIES ARE REQUIRED.  CRITICAL RESULT CALLED TO, READ BACK BY AND VERIFIED WITH: PHARMD Cleotis Lema AUMEISTER [id number] FCP Performed at [facility], [place] [address], [place], [place] [address]    Report Status 10/20/2019 FINAL  Final   Organism ID, Bacteria ENTEROCOCCUS FAECALIS  Final      Susceptibility   Enterococcus faecalis - MIC*    AMPICILLIN <=2 SENSITIVE Sensitive     VANCOMYCIN 1 SENSITIVE Sensitive     GENTAMICIN SYNERGY SENSITIVE Sensitive     * ENTEROCOCCUS FAECALIS  Blood culture (routine x 2)     Status: None   Collection Time: 10/15/19  4:29 PM   Specimen: BLOOD  Result Value Ref Range Status   Specimen Description BLOOD BLOOD RIGHT FOREARM  Final   Special Requests   Final    BOTTLES DRAWN AEROBIC AND ANAEROBIC Blood Culture results may not be optimal due to an inadequate volume of blood received in culture bottles   Culture   Final    NO GROWTH 5 DAYS Performed at [facility], [address] [place], [place] [address]    Report Status 10/20/2019 FINAL  Final  SARS Coronavirus 2 by RT PCR (hospital order, performed in [facility] hospital lab) Nasopharyngeal Nasopharyngeal Swab     Status: None   Collection Time: 10/15/19  4:29 PM   Specimen: Nasopharyngeal Swab  Result Value Ref Range Status   SARS Coronavirus 2 NEGATIVE NEGATIVE Final    Comment: (NOTE) If result is NEGATIVE SARS-CoV-2 target nucleic acids are NOT DETECTED. The SARS-CoV-2 RNA is generally detectable in upper and lower  respiratory specimens during the acute phase of infection. The lowest  concentration of SARS-CoV-2  viral copies this assay can detect is 250  copies / mL. A negative result does not preclude SARS-CoV-2 infection  and should not be used as the sole basis for treatment or other  patient management decisions.  A negative result may occur with  improper specimen collection / handling, submission of specimen other  than nasopharyngeal swab, presence of viral mutation(s) within the  areas targeted by this assay, and inadequate number of viral copies  (<250 copies / mL). A negative result must be combined with clinical  observations, patient history, and epidemiological information. If result is POSITIVE SARS-CoV-2 target nucleic acids are DETECTED. The SARS-CoV-2 RNA is generally detectable in upper and lower  respiratory specimens dur ing the acute phase of infection.  Positive  results are indicative of active infection with SARS-CoV-2.  Clinical  correlation with patient history and other diagnostic information is  necessary to determine patient infection status.  Positive results do  not rule out bacterial infection or co-infection with other viruses. If result is PRESUMPTIVE POSTIVE SARS-CoV-2 nucleic acids MAY BE PRESENT.   A presumptive positive result was obtained on the submitted specimen  and confirmed on repeat testing.  While 2019 novel coronavirus  (SARS-CoV-2) nucleic acids may be present in the submitted sample  additional confirmatory testing may be necessary for epidemiological  and / or clinical management purposes  to differentiate between  SARS-CoV-2 and other Sarbecovirus currently known to infect humans.  If clinically indicated additional testing with an alternate test  methodology [phone]) is advised. The SARS-CoV-2 RNA is generally  detectable in upper and lower respiratory sp ecimens during the acute  phase of infection. The expected result is Negative. Fact Sheet for Patients:  StrictlyIdeas.no Fact Sheet for Healthcare Providers:  BankingDealers.co.za This test is not yet approved or cleared by  the [employer] and has been authorized for detection and/or diagnosis of SARS-CoV-2 by FDA under an Emergency Use Authorization (EUA).  This EUA will remain in effect (meaning this test can be used) for the duration of the COVID-19 declaration under Section 564(b)(1) of the Act, 21 U.S.C. section 360bbb-3(b)(1), unless the authorization is terminated or revoked sooner. Performed at [facility], [address] [place], [place] [address]   GI pathogen panel by PCR, stool     Status: None   Collection Time: 10/15/19 10:00 PM   Specimen: Stool  Result Value Ref Range Status   Plesiomonas shigelloides NOT DETECTED NOT DETECTED Final   Yersinia enterocolitica NOT DETECTED NOT DETECTED Final   Vibrio NOT DETECTED NOT DETECTED Final   Enteropathogenic E coli NOT DETECTED NOT DETECTED Final   E coli (ETEC) LT/ST NOT DETECTED NOT DETECTED Final   E coli [id number] by PCR Not applicable NOT DETECTED Final   Cryptosporidium by PCR NOT DETECTED NOT DETECTED Final   Entamoeba histolytica NOT DETECTED NOT DETECTED Final   Adenovirus F 40/41 NOT DETECTED NOT DETECTED Final   Norovirus GI/GII NOT DETECTED NOT DETECTED Final   Sapovirus NOT DETECTED NOT DETECTED Final    Comment: (NOTE) Performed At: [facility] [place], [place] [id number] Rush Farmer MD [id number]    Vibrio cholerae NOT DETECTED NOT DETECTED Final   Campylobacter by PCR NOT DETECTED NOT DETECTED Final   Salmonella by PCR NOT DETECTED NOT DETECTED Final   E coli (STEC) NOT DETECTED NOT DETECTED Final   Enteroaggregative E coli NOT DETECTED NOT DETECTED Final   Shigella by PCR NOT DETECTED NOT DETECTED Final   Cyclospora cayetanensis NOT DETECTED NOT DETECTED Final   Astrovirus NOT DETECTED NOT DETECTED Final   G lamblia by PCR NOT DETECTED NOT DETECTED Final   Rotavirus A by PCR NOT DETECTED NOT  DETECTED Final  C Difficile Quick Screen w PCR reflex     Status: Abnormal   Collection Time: 10/15/19 10:00 PM   Specimen: Stool  Result Value Ref Range Status   C Diff antigen POSITIVE (A) NEGATIVE Final   C Diff toxin NEGATIVE NEGATIVE Final   C Diff interpretation Results are indeterminate. See PCR results.  Final    Comment: Performed at [facility], [address] [place], [place] [address]  C. Diff by PCR, Reflexed     Status: None   Collection Time: 10/15/19 10:00 PM  Result Value Ref Range Status   Toxigenic C. Difficile by PCR NEGATIVE NEGATIVE Final    Comment: Patient is colonized with non toxigenic C. difficile. May not need treatment unless significant symptoms are present. Performed at [facility], [address] [place], [place] [address]   MRSA PCR Screening     Status: None   Collection Time: 10/16/19  3:25 AM   Specimen: Nasal Mucosa; Nasopharyngeal  Result Value Ref Range Status   MRSA by PCR NEGATIVE NEGATIVE Final    Comment:        The GeneXpert MRSA Assay (FDA approved for NASAL specimens only), is one component of a comprehensive MRSA colonization surveillance program. It is not intended to diagnose MRSA infection nor to guide or monitor treatment for MRSA infections. Performed at [facility], [place] [address] [place], [place] [address]   Culture, blood (routine x 2)     Status: None   Collection Time: 10/16/19  5:55 AM   Specimen: BLOOD RIGHT ARM  Result Value Ref Range Status   Specimen Description BLOOD RIGHT ARM  Final   Special Requests   Final    BOTTLES DRAWN AEROBIC AND ANAEROBIC Blood Culture results may not be optimal due to an inadequate volume of blood received in culture bottles   Culture   Final    NO GROWTH 5 DAYS Performed at [facility], [place] [address] [place], [place] [address]    Report Status 10/21/2019 FINAL  Final  Culture, blood (routine x 2)     Status: None   Collection Time: 10/16/19   5:57 AM   Specimen: BLOOD LEFT ARM  Result Value Ref Range Status   Specimen Description BLOOD LEFT ARM  Final   Special Requests   Final    BOTTLES DRAWN AEROBIC AND ANAEROBIC Blood Culture results may not be optimal due to an inadequate volume of blood received in culture bottles   Culture   Final    NO GROWTH 5 DAYS Performed at [facility], [place] [address] [place], [place] [address]    Report Status 10/21/2019 FINAL  Final  Culture, blood (Routine X 2) w Reflex to ID Panel     Status: None (Preliminary result)   Collection Time: 10/18/19 10:39 AM   Specimen: BLOOD  Result Value Ref Range Status   Specimen Description BLOOD RIGHT ANTECUBITAL  Final   Special Requests   Final    BOTTLES DRAWN AEROBIC AND ANAEROBIC Blood Culture adequate volume   Culture   Final    NO GROWTH 4 DAYS Performed at [facility], [place] [address] [place], [place] [address]    Report Status PENDING  Incomplete  Culture, blood (Routine X 2) w Reflex to ID Panel     Status: None (Preliminary result)   Collection Time: 10/18/19 10:39 AM   Specimen: BLOOD  Result Value Ref Range Status   Specimen Description BLOOD BLOOD RIGHT HAND  Final   Special Requests   Final    BOTTLES DRAWN AEROBIC AND ANAEROBIC Blood Culture adequate volume   Culture   Final    NO GROWTH 4 DAYS Performed at [facility], [place] [address], [place], [place] [address]    Report Status PENDING  Incomplete    Janene Madeira, MSN, NP-C [facility] [email or web address] Pager: [phone] Office: [place]: [phone]

## 2019-10-22 NOTE — Progress Notes (Signed)
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  In the emergency room, blood pressure 66/44, temperature 102.  CT head negative.  CTA chest, left lung density likely metastatic disease.  CT abdomen pelvis with postoperative changes, 3 x 2 cm fluid collection adjacent to the rectal mass.  He was treated with vasopressors and admitted to ICU. 10/15/2019, blood cultures with Enterococcus faecalis 10/15/2019, blood cultures positive for coag negative Staphylococcus, 2 out of 4 from same bottle Urine cultures growing Pseudomonas aeruginosa, Enterococcus. C. difficile antigen positive, toxin negative. Transferred out of ICU on 10/20/2019 with stabilization.  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 Admitted to ICU and treated with Levophed and subsequently stabilized.  Blood pressure stabilized. Right kidney patchy enhancement on CT scan, suspicious for pyelonephritis .  Foley replaced in the ER.  Echocardiogram normal.  Fluid collection not large enough for drain as per surgery. Enterococcus faecalis bacteremia, repeat cultures negative, currently remains on Zosyn. Urine culture with Pseudomonas,  Followed by infectious disease. Coag negative  Staphylococcus is probably contaminant, will monitor. TEE with no evidence of endocarditis. Infectious disease suggested to continue Zosyn while in the hospital, convert to Augmentin for total 10 days of therapy. Will change to Augmentin on discharge.  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 Patient desired palliative treatment.  Followed by oncology outpatient.  Currently not ready for any treatment.  Outpatient follow-up after improvement.  Hypokalemia: Replaced.  Improved.  Marland Kitchen  Hypomagnesemia: Replace and improved.  Work with PT OT.  Patient wants to go home.  Probably home with home health. Voiding trial today.  DVT prophylaxis: Subcu heparin Code Status: Full code Family Communication: None Disposition Plan: Remains in the hospital.  He wants to go home tomorrow if adequate improvement.   Consultants:   Infectious disease  PCCM  Surgery  Procedures:   None  Antimicrobials:   Vancomycin 10/16/2019-10/29  Cefepime, 10/28--10/29  Flagyl, 10/28-10/29  Unasyn 10/29-10/31  Zosyn, 10/31------   Subjective:  No overnight events.  Remains afebrile.  Blood pressures are borderline low, however asymptomatic. He wants to have Foley catheter taken out.  I reviewed his previous hospitalization, catheter was given after his surgery and failed voiding trial. He is not wanting to go back to nursing home, he wants to go home, he has enough support at home.  Objective: Vitals:   10/22/19 0340 10/22/19 0517 10/22/19 0832 10/22/19 1134  BP: (!) 90/56  (!) 84/53 (!) 113/56  Pulse: 74  77 70  Resp: (!) 21  (!) 23 (!) 23  Temp: 97.7 F (36.5 C)  (!) 97.3 F (36.3 C) (!) 97.4 F (36.3 C)  TempSrc: Oral  Oral Oral  SpO2: 98%  98% 100%  Weight:  60  kg    Height:        Intake/Output Summary (Last 24 hours) at 10/22/2019 1251 Last data filed at 10/22/2019 1000 Gross per 24 hour   Intake 2799.74 ml  Output 1355 ml  Net 1444.74 ml   Filed Weights   10/20/19 0638 10/21/19 0355 10/22/19 0517  Weight: 57.2 kg 61 kg 60 kg    Examination:  General exam: Appears calm and comfortable, on room air.  Chronically sick looking.  Left IJ CVC present. Respiratory system: Clear to auscultation. Respiratory effort normal.  No added sound. Cardiovascular system: S1 & S2 heard, RRR. No JVD, murmurs, rubs, gallops or clicks. No pedal edema. Gastrointestinal system: Abdomen is nondistended, soft and nontender. No organomegaly or masses felt. Normal bowel sounds heard. Left lower quadrant colostomy bag with loose brown stool and lot of gas. Midline surgical scar with some drainage on the inferior aspect. Foley catheter with clear urine. Central nervous system: Alert and oriented. No focal neurological deficits. Extremities: Symmetric 5 x 5 power. Skin: He has a pressure ulcer right side of the buttock, small healing stage II. Psychiatry: Judgement and insight appear normal. Mood & affect appropriate.     Data Reviewed: I have personally reviewed following labs and imaging studies  CBC: Recent Labs  Lab 10/15/19 1624  10/18/19 0425 10/19/19 0448 10/20/19 0456 10/21/19 0221 10/22/19 0358  WBC 12.0*   < > 8.2 10.1 10.0 8.1 6.0  NEUTROABS 10.2*  --   --   --   --   --  4.5  HGB 9.6*   < > 8.5* 8.2* 7.9* 8.3* 7.5*  HCT 30.0*   < > 26.1* 25.7* 24.5* 25.3* 24.1*  MCV 94.6   < > 94.6 95.5 93.9 94.4 97.6  PLT 273   < > 248 248 202 261 273   < > = values in this interval not displayed.   Basic Metabolic Panel: Recent Labs  Lab 10/16/19 0559  10/18/19 0425 10/19/19 0448 10/20/19 0456 10/21/19 0221 10/22/19 0358  NA 141   < > 140 141 141 141 140  K 2.9*   < > 3.6 2.9* 3.6 4.0 3.8  CL 111   < > 109 108 111 112* 109  CO2 21*   < > 24 25 24 24 25   GLUCOSE 127*   < > 98 93 88 97 88  BUN 15   < > 12 7* 6* 6* <5*  CREATININE 1.20   < > 0.84 0.80 0.84 0.85 0.90  CALCIUM  7.4*   < > 7.6* 7.4* 7.4* 7.5* 7.5*  MG 1.8   < > 1.5* 1.5* 1.8 1.6* 1.7  PHOS 4.0  --  1.3*  --   --   --  2.2*   < > = values in this interval not displayed.   GFR: Estimated Creatinine Clearance: 55.6 mL/min (by C-G formula based on SCr of 0.9 mg/dL). Liver Function Tests: Recent Labs  Lab 10/15/19 1624  AST 36  ALT 19  ALKPHOS 119  BILITOT 0.4  PROT 6.0*  ALBUMIN 1.9*   No results for input(s): LIPASE, AMYLASE in the last 168 hours. No results for input(s): AMMONIA in the last 168 hours. Coagulation Profile: No results for input(s): INR, PROTIME in the last 168 hours. Cardiac Enzymes: No results for input(s): CKTOTAL, CKMB, CKMBINDEX, TROPONINI in the last 168 hours. BNP (last 3 results) No results for input(s): PROBNP in the last 8760 hours. HbA1C: No results for input(s): HGBA1C in  the last 72 hours. CBG: Recent Labs  Lab 10/16/19 0358 10/16/19 0753 10/18/19 1557  GLUCAP 130* 94 154*   Lipid Profile: No results for input(s): CHOL, HDL, LDLCALC, TRIG, CHOLHDL, LDLDIRECT in the last 72 hours. Thyroid Function Tests: No results for input(s): TSH, T4TOTAL, FREET4, T3FREE, THYROIDAB in the last 72 hours. Anemia Panel: No results for input(s): VITAMINB12, FOLATE, FERRITIN, TIBC, IRON, RETICCTPCT in the last 72 hours. Sepsis Labs: Recent Labs  Lab 10/15/19 1624  LATICACIDVEN 1.4    Recent Results (from the past 240 hour(s))  Urine culture     Status: Abnormal   Collection Time: 10/15/19  4:25 PM   Specimen: Urine, Catheterized  Result Value Ref Range Status   Specimen Description   Final    URINE, CATHETERIZED Performed at [facility], [address] [place], [place] [address]    Special Requests   Final    NONE Performed at [facility], [address] [place], [place] [address]    Culture (A)  Final    70,000 COLONIES/mL PSEUDOMONAS AERUGINOSA 80,000 COLONIES/mL ENTEROCOCCUS FAECALIS    Report Status 10/18/2019 FINAL  Final    Organism ID, Bacteria PSEUDOMONAS AERUGINOSA (A)  Final   Organism ID, Bacteria ENTEROCOCCUS FAECALIS (A)  Final      Susceptibility   Enterococcus faecalis - MIC*    AMPICILLIN <=2 SENSITIVE Sensitive     LEVOFLOXACIN 0.5 SENSITIVE Sensitive     NITROFURANTOIN <=16 SENSITIVE Sensitive     VANCOMYCIN 1 SENSITIVE Sensitive     * 80,000 COLONIES/mL ENTEROCOCCUS FAECALIS   Pseudomonas aeruginosa - MIC*    CEFTAZIDIME 4 SENSITIVE Sensitive     CIPROFLOXACIN <=0.25 SENSITIVE Sensitive     GENTAMICIN 8 INTERMEDIATE Intermediate     IMIPENEM 2 SENSITIVE Sensitive     PIP/TAZO 16 SENSITIVE Sensitive     CEFEPIME 8 SENSITIVE Sensitive     * 70,000 COLONIES/mL PSEUDOMONAS AERUGINOSA  Blood culture (routine x 2)     Status: Abnormal   Collection Time: 10/15/19  4:25 PM   Specimen: BLOOD  Result Value Ref Range Status   Specimen Description   Final    BLOOD LEFT ANTECUBITAL Performed at [facility], [address] [place], [place] [address]    Special Requests   Final    BOTTLES DRAWN AEROBIC AND ANAEROBIC Blood Culture results may not be optimal due to an inadequate volume of blood received in culture bottles Performed at [facility], [address] [place], [place] [address]    Culture  Setup Time   Final    IN BOTH AEROBIC AND ANAEROBIC BOTTLES GRAM POSITIVE COCCI CRITICAL RESULT CALLED TO, READ BACK BY AND VERIFIED WITH: SCOTT HALL AT [phone] ON 10/16/2019 JJB Performed at [facility], [address] [place], [place] [address]    Culture (A)  Final    ENTEROCOCCUS FAECALIS STAPHYLOCOCCUS SPECIES (COAGULASE NEGATIVE) THE SIGNIFICANCE OF ISOLATING THIS ORGANISM FROM A SINGLE SET OF BLOOD CULTURES WHEN MULTIPLE SETS ARE DRAWN IS UNCERTAIN. PLEASE NOTIFY THE MICROBIOLOGY DEPARTMENT WITHIN ONE WEEK IF SPECIATION AND SENSITIVITIES ARE REQUIRED. CRITICAL RESULT CALLED TO, READ BACK BY AND VERIFIED WITH: PHARMD Cleotis Lema AUMEISTER [id number] FCP Performed at  [facility], [place] [address], [place], [place] [address]    Report Status 10/20/2019 FINAL  Final   Organism ID, Bacteria ENTEROCOCCUS FAECALIS  Final      Susceptibility   Enterococcus faecalis - MIC*    AMPICILLIN <=2  SENSITIVE Sensitive     VANCOMYCIN 1 SENSITIVE Sensitive     GENTAMICIN SYNERGY SENSITIVE Sensitive     * ENTEROCOCCUS FAECALIS  Blood culture (routine x 2)     Status: None   Collection Time: 10/15/19  4:29 PM   Specimen: BLOOD  Result Value Ref Range Status   Specimen Description BLOOD BLOOD RIGHT FOREARM  Final   Special Requests   Final    BOTTLES DRAWN AEROBIC AND ANAEROBIC Blood Culture results may not be optimal due to an inadequate volume of blood received in culture bottles   Culture   Final    NO GROWTH 5 DAYS Performed at [facility] [address], [place], [place] [address]    Report Status 10/20/2019 FINAL  Final  SARS Coronavirus 2 by RT PCR (hospital order, performed in [place] hospital lab) Nasopharyngeal Nasopharyngeal Swab     Status: None   Collection Time: 10/15/19  4:29 PM   Specimen: Nasopharyngeal Swab  Result Value Ref Range Status   SARS Coronavirus 2 NEGATIVE NEGATIVE Final    Comment: (NOTE) If result is NEGATIVE SARS-CoV-2 target nucleic acids are NOT DETECTED. The SARS-CoV-2 RNA is generally detectable in upper and lower  respiratory specimens during the acute phase of infection. The lowest  concentration of SARS-CoV-2 viral copies this assay can detect is 250  copies / mL. A negative result does not preclude SARS-CoV-2 infection  and should not be used as the sole basis for treatment or other  patient management decisions.  A negative result may occur with  improper specimen collection / handling, submission of specimen other  than nasopharyngeal swab, presence of viral mutation(s) within the  areas targeted by this assay, and inadequate number of viral copies  (<250 copies / mL). A negative result must be  combined with clinical  observations, patient history, and epidemiological information. If result is POSITIVE SARS-CoV-2 target nucleic acids are DETECTED. The SARS-CoV-2 RNA is generally detectable in upper and lower  respiratory specimens dur ing the acute phase of infection.  Positive  results are indicative of active infection with SARS-CoV-2.  Clinical  correlation with patient history and other diagnostic information is  necessary to determine patient infection status.  Positive results do  not rule out bacterial infection or co-infection with other viruses. If result is PRESUMPTIVE POSTIVE SARS-CoV-2 nucleic acids MAY BE PRESENT.   A presumptive positive result was obtained on the submitted specimen  and confirmed on repeat testing.  While 2019 novel coronavirus  (SARS-CoV-2) nucleic acids may be present in the submitted sample  additional confirmatory testing may be necessary for epidemiological  and / or clinical management purposes  to differentiate between  SARS-CoV-2 and other Sarbecovirus currently known to infect humans.  If clinically indicated additional testing with an alternate test  methodology [phone]) is advised. The SARS-CoV-2 RNA is generally  detectable in upper and lower respiratory sp ecimens during the acute  phase of infection. The expected result is Negative. Fact Sheet for Patients:  StrictlyIdeas.no Fact Sheet for Healthcare Providers: BankingDealers.co.za This test is not yet approved or cleared by the [place] FDA and has been authorized for detection and/or diagnosis of SARS-CoV-2 by FDA under an Emergency Use Authorization (EUA).  This EUA will remain in effect (meaning this test can be used) for the duration of the COVID-19 declaration under Section 564(b)(1) of the Act, 21 U.S.C. section 360bbb-3(b)(1), unless the authorization is terminated or revoked sooner. Performed at [facility],  [place], [place] [address]   GI pathogen panel by PCR, stool     Status: None   Collection Time: 10/15/19 10:00 PM   Specimen: Stool  Result Value Ref Range Status   Plesiomonas shigelloides NOT DETECTED NOT DETECTED Final   Yersinia enterocolitica NOT DETECTED NOT DETECTED Final   Vibrio NOT DETECTED NOT DETECTED Final   Enteropathogenic E coli NOT DETECTED NOT DETECTED Final   E coli (ETEC) LT/ST NOT DETECTED NOT DETECTED Final   E coli [id number] by PCR Not applicable NOT DETECTED Final   Cryptosporidium by PCR NOT DETECTED NOT DETECTED Final   Entamoeba histolytica NOT DETECTED NOT DETECTED Final   Adenovirus F 40/41 NOT DETECTED NOT DETECTED Final   Norovirus GI/GII NOT DETECTED NOT DETECTED Final   Sapovirus NOT DETECTED NOT DETECTED Final    Comment: (NOTE) Performed At: [facility] [place], [place] [id number] Rush Farmer MD [id number]    Vibrio cholerae NOT DETECTED NOT DETECTED Final   Campylobacter by PCR NOT DETECTED NOT DETECTED Final   Salmonella by PCR NOT DETECTED NOT DETECTED Final   E coli (STEC) NOT DETECTED NOT DETECTED Final   Enteroaggregative E coli NOT DETECTED NOT DETECTED Final   Shigella by PCR NOT DETECTED NOT DETECTED Final   Cyclospora cayetanensis NOT DETECTED NOT DETECTED Final   Astrovirus NOT DETECTED NOT DETECTED Final   G lamblia by PCR NOT DETECTED NOT DETECTED Final   Rotavirus A by PCR NOT DETECTED NOT DETECTED Final  C Difficile Quick Screen w PCR reflex     Status: Abnormal   Collection Time: 10/15/19 10:00 PM   Specimen: Stool  Result Value Ref Range Status   C Diff antigen POSITIVE (A) NEGATIVE Final   C Diff toxin NEGATIVE NEGATIVE Final   C Diff interpretation Results are indeterminate. See PCR results.  Final    Comment: Performed at [facility], [address] [place], [place] [address]  C. Diff by PCR, Reflexed     Status: None   Collection Time: 10/15/19 10:00 PM   Result Value Ref Range Status   Toxigenic C. Difficile by PCR NEGATIVE NEGATIVE Final    Comment: Patient is colonized with non toxigenic C. difficile. May not need treatment unless significant symptoms are present. Performed at [facility], [address] [place], [place] [address]   MRSA PCR Screening     Status: None   Collection Time: 10/16/19  3:25 AM   Specimen: Nasal Mucosa; Nasopharyngeal  Result Value Ref Range Status   MRSA by PCR NEGATIVE NEGATIVE Final    Comment:        The GeneXpert MRSA Assay (FDA approved for NASAL specimens only), is one component of a comprehensive MRSA colonization surveillance program. It is not intended to diagnose MRSA infection nor to guide or monitor treatment for MRSA infections. Performed at [facility], [place] [address] [place], [place] [address]   Culture, blood (routine x 2)     Status: None   Collection Time: 10/16/19  5:55 AM   Specimen: BLOOD RIGHT ARM  Result Value Ref Range Status   Specimen Description BLOOD RIGHT ARM  Final   Special Requests   Final    BOTTLES DRAWN AEROBIC AND ANAEROBIC Blood Culture results may not be optimal due to an inadequate volume of blood received in culture bottles   Culture   Final    NO GROWTH 5 DAYS Performed at [facility], [place] [address] [place], [place] [address]    Report Status 10/21/2019 FINAL  Final  Culture, blood (routine x 2)     Status: None   Collection Time: 10/16/19  5:57 AM   Specimen: BLOOD LEFT ARM  Result Value Ref Range Status   Specimen Description BLOOD LEFT ARM  Final   Special Requests   Final    BOTTLES DRAWN AEROBIC AND ANAEROBIC Blood Culture results may not be optimal due to an inadequate volume of blood received in culture bottles   Culture   Final    NO GROWTH 5 DAYS Performed at [facility], [place] [address], [place], [place] [address]    Report Status 10/21/2019 FINAL  Final  Culture, blood (Routine X 2) w Reflex to ID Panel      Status: None (Preliminary result)   Collection Time: 10/18/19 10:39 AM   Specimen: BLOOD  Result Value Ref Range Status   Specimen Description BLOOD RIGHT ANTECUBITAL  Final   Special Requests   Final    BOTTLES DRAWN AEROBIC AND ANAEROBIC Blood Culture adequate volume   Culture   Final    NO GROWTH 4 DAYS Performed at [facility], [place] [address], [place], [place] [address]    Report Status PENDING  Incomplete  Culture, blood (Routine X 2) w Reflex to ID Panel     Status: None (Preliminary result)   Collection Time: 10/18/19 10:39 AM   Specimen: BLOOD  Result Value Ref Range Status   Specimen Description BLOOD BLOOD RIGHT HAND  Final   Special Requests   Final    BOTTLES DRAWN AEROBIC AND ANAEROBIC Blood Culture adequate volume   Culture   Final    NO GROWTH 4 DAYS Performed at [facility], [place] [address] [place], [place] [address]    Report Status PENDING  Incomplete         Radiology Studies: No results found.      Scheduled Meds: . Chlorhexidine Gluconate Cloth  6 each Topical Daily  . docusate sodium  100 mg Oral Daily  . heparin  5,000 Units Subcutaneous Q8H  . magnesium oxide  400 mg Oral BID  . multivitamin with minerals  1 tablet Oral Daily   Continuous Infusions: . sodium chloride Stopped (10/19/19 0946)  . piperacillin-tazobactam (ZOSYN)  IV 3.375 g (10/22/19 0946)     LOS: 6 days    Time spent: 25 minutes     Barb Merino, MD [facility] Pager [phone]

## 2019-10-22 NOTE — Progress Notes (Signed)
Explained to the pt the purpose of voiding trial, clamped foley cath and encouraged to call once he has the feeling  to void.

## 2019-10-22 NOTE — Progress Notes (Signed)
Foley cath clamped for 3 hours but no urge to void.Requested to take foley off,done tolerated well. Urinal within reach.

## 2019-10-23 ENCOUNTER — Institutional Professional Consult (permissible substitution): Payer: Medicare Other | Admitting: Pulmonary Disease

## 2019-10-23 ENCOUNTER — Encounter (HOSPITAL_COMMUNITY): Payer: Self-pay | Admitting: Internal Medicine

## 2019-10-23 LAB — CBC WITH DIFFERENTIAL/PLATELET
Abs Immature Granulocytes: 0.02 10*3/uL (ref 0.00–0.07)
Basophils Absolute: 0 10*3/uL (ref 0.0–0.1)
Basophils Relative: 0 %
Eosinophils Absolute: 0 10*3/uL (ref 0.0–0.5)
Eosinophils Relative: 1 %
HCT: 26.5 % — ABNORMAL LOW (ref 39.0–52.0)
Hemoglobin: 8.3 g/dL — ABNORMAL LOW (ref 13.0–17.0)
Immature Granulocytes: 0 %
Lymphocytes Relative: 16 %
Lymphs Abs: 0.9 10*3/uL (ref 0.7–4.0)
MCH: 30.6 pg (ref 26.0–34.0)
MCHC: 31.3 g/dL (ref 30.0–36.0)
MCV: 97.8 fL (ref 80.0–100.0)
Monocytes Absolute: 0.4 10*3/uL (ref 0.1–1.0)
Monocytes Relative: 7 %
Neutro Abs: 4.1 10*3/uL (ref 1.7–7.7)
Neutrophils Relative %: 76 %
Platelets: 320 10*3/uL (ref 150–400)
RBC: 2.71 MIL/uL — ABNORMAL LOW (ref 4.22–5.81)
RDW: 14.8 % (ref 11.5–15.5)
WBC: 5.4 10*3/uL (ref 4.0–10.5)
nRBC: 0 % (ref 0.0–0.2)

## 2019-10-23 LAB — CULTURE, BLOOD (ROUTINE X 2)
Culture: NO GROWTH
Culture: NO GROWTH
Special Requests: ADEQUATE
Special Requests: ADEQUATE

## 2019-10-23 MED ORDER — AMOXICILLIN-POT CLAVULANATE 875-125 MG PO TABS
1.0000 | ORAL_TABLET | Freq: Two times a day (BID) | ORAL | Status: DC
  Administered 2019-10-23 – 2019-10-25 (×4): 1 via ORAL
  Filled 2019-10-23 (×6): qty 1

## 2019-10-23 NOTE — Progress Notes (Signed)
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  In the emergency room, blood pressure 66/44, temperature 102.  CT head negative.  CTA chest, left lung density likely metastatic disease.  CT abdomen pelvis with postoperative changes, 3 x 2 cm fluid collection adjacent to the rectal mass.  He was treated with vasopressors and admitted to ICU. 10/15/2019, blood cultures with Enterococcus faecalis 10/15/2019, blood cultures positive for coag negative Staphylococcus, 2 out of 4 from same bottle Urine cultures growing Pseudomonas aeruginosa, Enterococcus. C. difficile antigen positive, toxin negative. Transferred out of ICU on 10/20/2019 with stabilization.  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 Admitted to ICU and treated with Levophed and subsequently stabilized.  Blood pressure stabilized. Right kidney patchy enhancement on CT scan, suspicious for pyelonephritis .  Foley replaced in the ER.  Echocardiogram normal.  Fluid collection not large enough for drain as per surgery. Enterococcus faecalis bacteremia, repeat cultures negative, currently remains on Zosyn. Urine culture with Pseudomonas,  TEE with no evidence of endocarditis. Discontinue Zosyn.   Discontinue central line.  Augmentin until 10/27/2019 as suggested by infectious disease for total 10 days of therapy after negative cultures.  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 Patient desired palliative treatment.  Followed by oncology outpatient.  Currently not ready for any treatment.  Outpatient follow-up after improvement. Poor understanding of the disease.  Patient not chemotherapy candidate at this time.  We will consult palliative care to collaborate and counsel to patient and family.  Hypokalemia: Replaced.  Improved.  Marland Kitchen  Hypomagnesemia: Replace and improved.  Urinary retention: Voiding trial given.  Patient retained more than 1 liters .  No more voiding trial.  Insert and maintain Foley catheter.  Outpatient urology follow-up if adequate improvement.  Ambulate.  Advance activities.  Work with PT OT. Discussed with patient's wife who is unable to help him at home at this time.  He will need a skilled nursing including ostomy care and inpatient therapies. Discussed with patient and he is agreeable to be referred to skilled rehab.  Social worker updated.  DVT prophylaxis: Subcu heparin Code Status: Full code Family Communication: None, wife on 10/22/2019 Disposition Plan: Skilled nursing facility when available.   Consultants:   Infectious disease  PCCM  Surgery  Procedures:   None  Antimicrobials:   Vancomycin 10/16/2019-10/29  Cefepime, 10/28--10/29  Flagyl, 10/28-10/29  Unasyn 10/29-10/31  Zosyn, 10/31------ 10/23/2019  Augmentin, 10/23/2019---   Subjective: Seen and examined.  No overnight events.  He thinks he urinated adequately, his bladder palpable on suprapubic area.  Foley catheter was inserted and there was 1200 mL urine. We discussed about going back to skilled rehab and he is agreeable. Discussed about his stage IV colon cancer.  I  told him that  there is no cure for this cancer, he thinks that God will decide the fate not [place].   Objective: Vitals:   10/23/19 0315 10/23/19 0754 10/23/19 0900 10/23/19 1028  BP: 99/68 92/67  109/68  Pulse:  81 70 71  Resp:  (!) 23 18 18   Temp: 98.1 F (36.7 C) 97.7 F (36.5 C)  97.8 F (36.6 C)  TempSrc: Oral Oral  Oral  SpO2:  100% 100% 100%  Weight: 60.3 kg     Height:        Intake/Output Summary (Last 24 hours) at 10/23/2019 1431 Last data filed at 10/23/2019 0000 Gross per 24 hour  Intake 170 ml  Output 1262 ml  Net -1092 ml   Filed Weights   10/21/19 0355 10/22/19 0517 10/23/19 0315  Weight: 61 kg 60 kg 60.3 kg    Examination:  General exam: Appears calm and comfortable, on room air.  Chronically sick looking.   Respiratory system: Clear to auscultation. Respiratory effort normal.  No added sound. Cardiovascular system: S1 & S2 heard, RRR. No JVD, murmurs, rubs, gallops or clicks. No pedal edema. Gastrointestinal system: Suprapubic fullness present.  Bladder scan with more than 1 L urine.  soft and nontender. No organomegaly or masses felt. Normal bowel sounds heard. Left lower quadrant colostomy bag with loose brown stool and lot of gas. Midline surgical scar with some drainage on the inferior aspect. Central nervous system: Alert and oriented. No focal neurological deficits. Extremities: Symmetric 5 x 5 power. Skin: He has a pressure ulcer right side of the buttock, small healing stage II. Psychiatry: Judgement and insight appear normal. Mood & affect appropriate.     Data Reviewed: I have personally reviewed following labs and imaging studies  CBC: Recent Labs  Lab 10/19/19 0448 10/20/19 0456 10/21/19 0221 10/22/19 0358 10/23/19 0500  WBC 10.1 10.0 8.1 6.0 5.4  NEUTROABS  --   --   --  4.5 4.1  HGB 8.2* 7.9* 8.3* 7.5* 8.3*  HCT 25.7* 24.5* 25.3* 24.1* 26.5*  MCV 95.5 93.9 94.4 97.6 97.8  PLT 248 202 261 273 [id number]   Basic Metabolic Panel: Recent Labs  Lab  10/18/19 0425 10/19/19 0448 10/20/19 0456 10/21/19 0221 10/22/19 0358  NA 140 141 141 141 140  K 3.6 2.9* 3.6 4.0 3.8  CL 109 108 111 112* 109  CO2 24 25 24 24 25   GLUCOSE 98 93 88 97 88  BUN 12 7* 6* 6* <5*  CREATININE 0.84 0.80 0.84 0.85 0.90  CALCIUM 7.6* 7.4* 7.4* 7.5* 7.5*  MG 1.5* 1.5* 1.8 1.6* 1.7  PHOS 1.3*  --   --   --  2.2*   GFR: Estimated Creatinine Clearance: 55.8 mL/min (by C-G formula based on SCr of 0.9 mg/dL). Liver Function Tests: No results for input(s): AST, ALT, ALKPHOS, BILITOT, PROT, ALBUMIN in the last 168 hours. No results for input(s): LIPASE, AMYLASE in the last 168 hours. No results for input(s): AMMONIA in the last 168 hours. Coagulation Profile: No results for input(s): INR, PROTIME in the last 168 hours. Cardiac Enzymes: No results for input(s): CKTOTAL, CKMB, CKMBINDEX, TROPONINI in the last 168 hours. BNP (last 3 results) No results for input(s): PROBNP in the last 8760 hours. HbA1C: No results for input(s): HGBA1C in the last 72 hours. CBG: Recent Labs  Lab 10/18/19 1557  GLUCAP 154*   Lipid Profile: No results for input(s): CHOL, HDL, LDLCALC, TRIG, CHOLHDL, LDLDIRECT in the last 72 hours.  Thyroid Function Tests: No results for input(s): TSH, T4TOTAL, FREET4, T3FREE, THYROIDAB in the last 72 hours. Anemia Panel: No results for input(s): VITAMINB12, FOLATE, FERRITIN, TIBC, IRON, RETICCTPCT in the last 72 hours. Sepsis Labs: No results for input(s): PROCALCITON, LATICACIDVEN in the last 168 hours.  Recent Results (from the past 240 hour(s))  Urine culture     Status: Abnormal   Collection Time: 10/15/19  4:25 PM   Specimen: Urine, Catheterized  Result Value Ref Range Status   Specimen Description   Final    URINE, CATHETERIZED Performed at [facility], [address] [place], [place] [address]    Special Requests   Final    NONE Performed at [facility], [address] [place], [place] [address]     Culture (A)  Final    70,000 COLONIES/mL PSEUDOMONAS AERUGINOSA 80,000 COLONIES/mL ENTEROCOCCUS FAECALIS    Report Status 10/18/2019 FINAL  Final   Organism ID, Bacteria PSEUDOMONAS AERUGINOSA (A)  Final   Organism ID, Bacteria ENTEROCOCCUS FAECALIS (A)  Final      Susceptibility   Enterococcus faecalis - MIC*    AMPICILLIN <=2 SENSITIVE Sensitive     LEVOFLOXACIN 0.5 SENSITIVE Sensitive     NITROFURANTOIN <=16 SENSITIVE Sensitive     VANCOMYCIN 1 SENSITIVE Sensitive     * 80,000 COLONIES/mL ENTEROCOCCUS FAECALIS   Pseudomonas aeruginosa - MIC*    CEFTAZIDIME 4 SENSITIVE Sensitive     CIPROFLOXACIN <=0.25 SENSITIVE Sensitive     GENTAMICIN 8 INTERMEDIATE Intermediate     IMIPENEM 2 SENSITIVE Sensitive     PIP/TAZO 16 SENSITIVE Sensitive     CEFEPIME 8 SENSITIVE Sensitive     * 70,000 COLONIES/mL PSEUDOMONAS AERUGINOSA  Blood culture (routine x 2)     Status: Abnormal   Collection Time: 10/15/19  4:25 PM   Specimen: BLOOD  Result Value Ref Range Status   Specimen Description   Final    BLOOD LEFT ANTECUBITAL Performed at [facility], [address] [place], [place] [address]    Special Requests   Final    BOTTLES DRAWN AEROBIC AND ANAEROBIC Blood Culture results may not be optimal due to an inadequate volume of blood received in culture bottles Performed at [facility], [address] [place], [place] [address]    Culture  Setup Time   Final    IN BOTH AEROBIC AND ANAEROBIC BOTTLES GRAM POSITIVE COCCI CRITICAL RESULT CALLED TO, READ BACK BY AND VERIFIED WITH: SCOTT HALL AT [phone] ON 10/16/2019 JJB Performed at [facility], [address] [place], [place] [address]    Culture (A)  Final    ENTEROCOCCUS FAECALIS STAPHYLOCOCCUS SPECIES (COAGULASE NEGATIVE) THE SIGNIFICANCE OF ISOLATING THIS ORGANISM FROM A SINGLE SET OF BLOOD CULTURES WHEN MULTIPLE SETS ARE DRAWN IS UNCERTAIN. PLEASE NOTIFY THE MICROBIOLOGY DEPARTMENT WITHIN ONE WEEK IF  SPECIATION AND SENSITIVITIES ARE REQUIRED. CRITICAL RESULT CALLED TO, READ BACK BY AND VERIFIED WITH: PHARMD Cleotis Lema AUMEISTER [id number] FCP Performed at [facility], [place] [address], [place],  [address]    Report Status 10/20/2019 FINAL  Final   Organism ID, Bacteria ENTEROCOCCUS FAECALIS  Final      Susceptibility   Enterococcus faecalis - MIC*    AMPICILLIN <=2 SENSITIVE Sensitive     VANCOMYCIN 1 SENSITIVE Sensitive     GENTAMICIN SYNERGY SENSITIVE Sensitive     * ENTEROCOCCUS FAECALIS  Blood culture (routine x 2)     Status: None  Collection Time: 10/15/19  4:29 PM   Specimen: BLOOD  Result Value Ref Range Status   Specimen Description BLOOD BLOOD RIGHT FOREARM  Final   Special Requests   Final    BOTTLES DRAWN AEROBIC AND ANAEROBIC Blood Culture results may not be optimal due to an inadequate volume of blood received in culture bottles   Culture   Final    NO GROWTH 5 DAYS Performed at [facility], [address] [place], [place] [address]    Report Status 10/20/2019 FINAL  Final  SARS Coronavirus 2 by RT PCR (hospital order, performed in [place] hospital lab) Nasopharyngeal Nasopharyngeal Swab     Status: None   Collection Time: 10/15/19  4:29 PM   Specimen: Nasopharyngeal Swab  Result Value Ref Range Status   SARS Coronavirus 2 NEGATIVE NEGATIVE Final    Comment: (NOTE) If result is NEGATIVE SARS-CoV-2 target nucleic acids are NOT DETECTED. The SARS-CoV-2 RNA is generally detectable in upper and lower  respiratory specimens during the acute phase of infection. The lowest  concentration of SARS-CoV-2 viral copies this assay can detect is 250  copies / mL. A negative result does not preclude SARS-CoV-2 infection  and should not be used as the sole basis for treatment or other  patient management decisions.  A negative result may occur with  improper specimen collection / handling, submission of specimen other  than nasopharyngeal swab,  presence of viral mutation(s) within the  areas targeted by this assay, and inadequate number of viral copies  (<250 copies / mL). A negative result must be combined with clinical  observations, patient history, and epidemiological information. If result is POSITIVE SARS-CoV-2 target nucleic acids are DETECTED. The SARS-CoV-2 RNA is generally detectable in upper and lower  respiratory specimens dur ing the acute phase of infection.  Positive  results are indicative of active infection with SARS-CoV-2.  Clinical  correlation with patient history and other diagnostic information is  necessary to determine patient infection status.  Positive results do  not rule out bacterial infection or co-infection with other viruses. If result is PRESUMPTIVE POSTIVE SARS-CoV-2 nucleic acids MAY BE PRESENT.   A presumptive positive result was obtained on the submitted specimen  and confirmed on repeat testing.  While 2019 novel coronavirus  (SARS-CoV-2) nucleic acids may be present in the submitted sample  additional confirmatory testing may be necessary for epidemiological  and / or clinical management purposes  to differentiate between  SARS-CoV-2 and other Sarbecovirus currently known to infect humans.  If clinically indicated additional testing with an alternate test  methodology [phone]) is advised. The SARS-CoV-2 RNA is generally  detectable in upper and lower respiratory sp ecimens during the acute  phase of infection. The expected result is Negative. Fact Sheet for Patients:  StrictlyIdeas.no Fact Sheet for Healthcare Providers: BankingDealers.co.za This test is not yet approved or cleared by the [place] FDA and has been authorized for detection and/or diagnosis of SARS-CoV-2 by FDA under an Emergency Use Authorization (EUA).  This EUA will remain in effect (meaning this test can be used) for the duration of the COVID-19 declaration under  Section 564(b)(1) of the Act, 21 U.S.C. section 360bbb-3(b)(1), unless the authorization is terminated or revoked sooner. Performed at [facility], [address] [place], [place] [address]   GI pathogen panel by PCR, stool     Status: None   Collection Time: 10/15/19 10:00 PM   Specimen: Stool  Result Value Ref Range Status  Plesiomonas shigelloides NOT DETECTED NOT DETECTED Final   Yersinia enterocolitica NOT DETECTED NOT DETECTED Final   Vibrio NOT DETECTED NOT DETECTED Final   Enteropathogenic E coli NOT DETECTED NOT DETECTED Final   E coli (ETEC) LT/ST NOT DETECTED NOT DETECTED Final   E coli [id number] by PCR Not applicable NOT DETECTED Final   Cryptosporidium by PCR NOT DETECTED NOT DETECTED Final   Entamoeba histolytica NOT DETECTED NOT DETECTED Final   Adenovirus F 40/41 NOT DETECTED NOT DETECTED Final   Norovirus GI/GII NOT DETECTED NOT DETECTED Final   Sapovirus NOT DETECTED NOT DETECTED Final    Comment: (NOTE) Performed At: [facility], [place] [id number] Rush Farmer MD [id number]    Vibrio cholerae NOT DETECTED NOT DETECTED Final   Campylobacter by PCR NOT DETECTED NOT DETECTED Final   Salmonella by PCR NOT DETECTED NOT DETECTED Final   E coli (STEC) NOT DETECTED NOT DETECTED Final   Enteroaggregative E coli NOT DETECTED NOT DETECTED Final   Shigella by PCR NOT DETECTED NOT DETECTED Final   Cyclospora cayetanensis NOT DETECTED NOT DETECTED Final   Astrovirus NOT DETECTED NOT DETECTED Final   G lamblia by PCR NOT DETECTED NOT DETECTED Final   Rotavirus A by PCR NOT DETECTED NOT DETECTED Final  C Difficile Quick Screen w PCR reflex     Status: Abnormal   Collection Time: 10/15/19 10:00 PM   Specimen: Stool  Result Value Ref Range Status   C Diff antigen POSITIVE (A) NEGATIVE Final   C Diff toxin NEGATIVE NEGATIVE Final   C Diff interpretation Results are indeterminate. See PCR results.  Final    Comment: Performed at  [facility], [address] [place], [place] [address]  C. Diff by PCR, Reflexed     Status: None   Collection Time: 10/15/19 10:00 PM  Result Value Ref Range Status   Toxigenic C. Difficile by PCR NEGATIVE NEGATIVE Final    Comment: Patient is colonized with non toxigenic C. difficile. May not need treatment unless significant symptoms are present. Performed at [facility], [address] [place], [place] [address]   MRSA PCR Screening     Status: None   Collection Time: 10/16/19  3:25 AM   Specimen: Nasal Mucosa; Nasopharyngeal  Result Value Ref Range Status   MRSA by PCR NEGATIVE NEGATIVE Final    Comment:        The GeneXpert MRSA Assay (FDA approved for NASAL specimens only), is one component of a comprehensive MRSA colonization surveillance program. It is not intended to diagnose MRSA infection nor to guide or monitor treatment for MRSA infections. Performed at [facility], [place] [address] [place], [place] [address]   Culture, blood (routine x 2)     Status: None   Collection Time: 10/16/19  5:55 AM   Specimen: BLOOD RIGHT ARM  Result Value Ref Range Status   Specimen Description BLOOD RIGHT ARM  Final   Special Requests   Final    BOTTLES DRAWN AEROBIC AND ANAEROBIC Blood Culture results may not be optimal due to an inadequate volume of blood received in culture bottles   Culture   Final    NO GROWTH 5 DAYS Performed at [facility], [place] [address] [place], [place] [address]    Report Status 10/21/2019 FINAL  Final  Culture, blood (routine x 2)     Status: None   Collection Time: 10/16/19  5:57 AM  Specimen: BLOOD LEFT ARM  Result Value Ref Range Status   Specimen Description BLOOD LEFT ARM  Final   Special Requests   Final    BOTTLES DRAWN AEROBIC AND ANAEROBIC Blood Culture results may not be optimal due to an inadequate volume of blood received in culture bottles   Culture   Final    NO GROWTH 5 DAYS Performed at [facility], [place] [address] [place], [place] [address]    Report Status 10/21/2019 FINAL  Final  Culture, blood (Routine X 2) w Reflex to ID Panel     Status: None   Collection Time: 10/18/19 10:39 AM   Specimen: BLOOD  Result Value Ref Range Status   Specimen Description BLOOD RIGHT ANTECUBITAL  Final   Special Requests   Final    BOTTLES DRAWN AEROBIC AND ANAEROBIC Blood Culture adequate volume   Culture   Final    NO GROWTH 5 DAYS Performed at [facility], [place] [address], [place], [place] [address]    Report Status 10/23/2019 FINAL  Final  Culture, blood (Routine X 2) w Reflex to ID Panel     Status: None   Collection Time: 10/18/19 10:39 AM   Specimen: BLOOD  Result Value Ref Range Status   Specimen Description BLOOD BLOOD RIGHT HAND  Final   Special Requests   Final    BOTTLES DRAWN AEROBIC AND ANAEROBIC Blood Culture adequate volume   Culture   Final    NO GROWTH 5 DAYS Performed at [facility], [place] [address] [place], [place] [address]    Report Status 10/23/2019 FINAL  Final         Radiology Studies: No results found.      Scheduled Meds: . amoxicillin-clavulanate  1 tablet Oral Q12H  . Chlorhexidine Gluconate Cloth  6 each Topical Daily  . docusate sodium  100 mg Oral Daily  . heparin  5,000 Units Subcutaneous Q8H  . magnesium oxide  400 mg Oral BID  . multivitamin with minerals  1 tablet Oral Daily  . potassium & sodium phosphates  1 packet Oral TID WC & HS   Continuous Infusions: . sodium chloride Stopped (10/19/19 0946)     LOS: 7 days    Time spent: 25 minutes     Barb Merino, MD [facility] Pager [phone]

## 2019-10-23 NOTE — Progress Notes (Signed)
After discussion with patient and patient's wife.  We discussed about infections, his stage IV cancer.  Patient persisted on going home. Call placed to patient's wife and updated. She agrees that he may not have long time to live.  She agrees that we should try to make him happy as much as possible. I have consulted palliative care. She is also interested on home hospice and any support that can be provided at home. Plan: We will try to convince patient to stay back until arrangements can be made at home. Palliative care team consulted, will appreciate their help to arrange home hospice.

## 2019-10-23 NOTE — Care Management Important Message (Signed)
Important Message  Patient Details  Name: Charles Espinoza MRN: [id number] Date of Birth: [birth year]   Medicare Important Message Given:  Yes     Shelda Altes 10/23/2019, 12:56 PM

## 2019-10-23 NOTE — Progress Notes (Signed)
OT Cancellation Note  Patient Details Name: Gearold Pier MRN: [id number] DOB: Aug 31, 1941   Cancelled Treatment:    Reason Eval/Treat Not Completed: Patient declined, no reason specified  Malka So 10/23/2019, 2:32 PM  Nestor Lewandowsky, OTR/L Acute Rehabilitation Services Pager: [phone] Office: [phone]

## 2019-10-23 NOTE — Progress Notes (Deleted)
Synopsis: Referred in October 2020 for lung mass by Rogers Blocker, MD  Subjective:   PATIENT ID: Charles Espinoza GENDER: male DOB: August 25, 1941, MRN: [id number]  No chief complaint on file.   This is an 78 year old gentleman with a past medical history   ***  Past Medical History:  Diagnosis Date  . Colon cancer ([place])   . COPD (chronic obstructive pulmonary disease) ([place])   . Elevated PSA   . Hx of radiation therapy 09/25/13- 11/27/13   prostate 7800 cGy 39 sessions  . Hyperlipidemia   . Prostate cancer ([place]) 02/27/13   gleason 6, volume 133 cc  . Prostatitis   . Sigmoid volvulus ([place])    s/p ex-lap  . Vitamin D deficiency      Family History  Problem Relation Age of Onset  . Prostate cancer Other      Past Surgical History:  Procedure Laterality Date  . COLOSTOMY  09/23/2019   Procedure: DIVERTING LOOP DESCENDING COLOSTOMY;  Surgeon: Coralie Keens, MD;  Location: [place];  Service: General;;  . LAPAROTOMY N/A 09/23/2019   Procedure: EXPLORATORY LAPAROTOMY;  Surgeon: Coralie Keens, MD;  Location: [place];  Service: General;  Laterality: N/A;  . PROSTATE BIOPSY  02/27/2013  . TONSILLECTOMY      Social History   Socioeconomic History  . Marital status: Married    Spouse name: Not on file  . Number of children: 3  . Years of education: Not on file  . Highest education level: Not on file  Occupational History  . Occupation: Curator, retired  Scientific laboratory technician  . Financial resource strain: Not on file  . Food insecurity    Worry: Not on file    Inability: Not on file  . Transportation needs    Medical: Not on file    Non-medical: Not on file  Tobacco Use  . Smoking status: Current Every Day Smoker    Packs/day: 1.00    Years: 60.00    Pack years: 60.00  . Smokeless tobacco: Never Used  Substance and Sexual Activity  . Alcohol use: Yes    Frequency: Never    Comment: Social  . Drug use: Yes    Types: Marijuana    Comment: Daily x50 years  . Sexual  activity: Not on file  Lifestyle  . Physical activity    Days per week: Not on file    Minutes per session: Not on file  . Stress: Not on file  Relationships  . Social Herbalist on phone: Not on file    Gets together: Not on file    Attends religious service: Not on file    Active member of club or organization: Not on file    Attends meetings of clubs or organizations: Not on file    Relationship status: Not on file  . Intimate partner violence    Fear of current or ex partner: Not on file    Emotionally abused: Not on file    Physically abused: Not on file    Forced sexual activity: Not on file  Other Topics Concern  . Not on file  Social History Narrative  . Not on file     No Active Allergies   Facility-Administered Medications Prior to Visit  Medication Dose Route Frequency Provider Last Rate Last Dose  . 0.9 %  sodium chloride infusion  250 mL Intravenous Continuous Fay Records, MD   Stopped at 10/19/19 [phone]  . acetaminophen (TYLENOL) tablet  650 mg  650 mg Oral Q4H PRN Fay Records, MD      . Chlorhexidine Gluconate Cloth 2 % PADS 6 each  6 each Topical Daily Fay Records, MD   6 each at 10/23/19 0957  . diphenhydrAMINE (BENADRYL) injection 25 mg  25 mg Intravenous Once PRN Fay Records, MD      . docusate sodium (COLACE) capsule 100 mg  100 mg Oral Daily Fay Records, MD   100 mg at 10/22/19 0943  . EPINEPHrine (EPI-PEN) injection 0.3 mg  0.3 mg Intramuscular Once PRN Fay Records, MD      . heparin injection 5,000 Units  5,000 Units Subcutaneous Q8H Fay Records, MD   5,000 Units at 10/23/19 0543  . loperamide (IMODIUM) capsule 2 mg  2 mg Oral PRN Fay Records, MD      . magnesium oxide (MAG-OX) tablet 400 mg  400 mg Oral BID Fay Records, MD   400 mg at 10/23/19 0955  . multivitamin with minerals tablet 1 tablet  1 tablet Oral Daily Fay Records, MD   1 tablet at 10/23/19 0955  . oxyCODONE (Oxy IR/ROXICODONE) immediate release tablet 5 mg  5 mg  Oral Q4H PRN Fay Records, MD      . piperacillin-tazobactam (ZOSYN) IVPB 3.375 g  3.375 g Intravenous Q8H Fay Records, MD 12.5 mL/hr at 10/23/19 0847 3.375 g at 10/23/19 0847  . potassium & sodium phosphates (PHOS-NAK) 280-160-250 MG packet 1 packet  1 packet Oral TID WC & HS Barb Merino, MD   1 packet at 10/23/19 0540   Outpatient Medications Prior to Visit  Medication Sig Dispense Refill  . Difluprednate (DUREZOL) 0.05 % EMUL Place 1 drop into the left eye daily.    . feeding supplement, ENSURE ENLIVE, (ENSURE ENLIVE) LIQD Take 237 mLs by mouth 3 (three) times daily between meals. 237 mL 12  . ferrous sulfate 325 (65 FE) MG tablet Take 325 mg by mouth 2 (two) times daily.    . Multiple Vitamin (MULTIVITAMIN WITH MINERALS) TABS tablet Take 1 tablet by mouth daily. 30 tablet 0  . polyethylene glycol (MIRALAX / GLYCOLAX) 17 g packet Take 17 g by mouth daily. 14 each 0  . tamsulosin (FLOMAX) 0.4 MG CAPS capsule Take 1 capsule (0.4 mg total) by mouth daily. 30 capsule 1  . vitamin C (ASCORBIC ACID) 500 MG tablet Take 500 mg by mouth 2 (two) times daily.      ROS   Objective:  Physical Exam   There were no vitals filed for this visit.   on *** LPM *** RA BMI Readings from Last 3 Encounters:  10/23/19 20.21 kg/m  10/09/19 18.20 kg/m  09/23/19 23.57 kg/m   Wt Readings from Last 3 Encounters:  10/23/19 132 lb 14.4 oz (60.3 kg)  10/09/19 119 lb 11.2 oz (54.3 kg)  09/23/19 155 lb (70.3 kg)     CBC    Component Value Date/Time   WBC 5.4 10/23/2019 0500   RBC 2.71 (L) 10/23/2019 0500   HGB 8.3 (L) 10/23/2019 0500   HCT 26.5 (L) 10/23/2019 0500   PLT 320 10/23/2019 0500   MCV 97.8 10/23/2019 0500   MCH 30.6 10/23/2019 0500   MCHC 31.3 10/23/2019 0500   RDW 14.8 10/23/2019 0500   LYMPHSABS 0.9 10/23/2019 0500   MONOABS 0.4 10/23/2019 0500   EOSABS 0.0 10/23/2019 0500   BASOSABS 0.0 10/23/2019 0500    ***  Chest Imaging: ***  Pulmonary Functions Testing Results:  No flowsheet data found.  FeNO: ***  Pathology: ***  Echocardiogram: ***  Heart Catheterization: ***    Assessment & Plan:   No diagnosis found.  Discussion: ***  No current facility-administered medications for this visit.  No current outpatient medications on file.  Facility-Administered Medications Ordered in Other Visits:  .  0.9 %  sodium chloride infusion, 250 mL, Intravenous, Continuous, Fay Records, MD, Stopped at 10/19/19 [phone] .  acetaminophen (TYLENOL) tablet 650 mg, 650 mg, Oral, Q4H PRN, Fay Records, MD .  Chlorhexidine Gluconate Cloth 2 % PADS 6 each, 6 each, Topical, Daily, Fay Records, MD, 6 each at 10/23/19 0957 .  diphenhydrAMINE (BENADRYL) injection 25 mg, 25 mg, Intravenous, Once PRN, Fay Records, MD .  docusate sodium (COLACE) capsule 100 mg, 100 mg, Oral, Daily, Fay Records, MD, 100 mg at 10/22/19 0943 .  EPINEPHrine (EPI-PEN) injection 0.3 mg, 0.3 mg, Intramuscular, Once PRN, Fay Records, MD .  heparin injection 5,000 Units, 5,000 Units, Subcutaneous, Q8H, Fay Records, MD, 5,000 Units at 10/23/19 0543 .  loperamide (IMODIUM) capsule 2 mg, 2 mg, Oral, PRN, Fay Records, MD .  magnesium oxide (MAG-OX) tablet 400 mg, 400 mg, Oral, BID, Fay Records, MD, 400 mg at 10/23/19 0955 .  multivitamin with minerals tablet 1 tablet, 1 tablet, Oral, Daily, Fay Records, MD, 1 tablet at 10/23/19 0955 .  oxyCODONE (Oxy IR/ROXICODONE) immediate release tablet 5 mg, 5 mg, Oral, Q4H PRN, Fay Records, MD .  piperacillin-tazobactam (ZOSYN) IVPB 3.375 g, 3.375 g, Intravenous, Q8H, Fay Records, MD, Last Rate: 12.5 mL/hr at 10/23/19 0847, 3.375 g at 10/23/19 0847 .  potassium & sodium phosphates (PHOS-NAK) 280-160-250 MG packet 1 packet, 1 packet, Oral, TID WC & HS, Barb Merino, MD, 1 packet at 10/23/19 Onset, DO [facility] 10/23/2019 10:09 AM

## 2019-10-23 NOTE — Progress Notes (Addendum)
PT Cancellation Note  Patient Details Name: Charles Espinoza MRN: [id number] DOB: Jan 23, 1941   Cancelled Treatment:    Reason Eval/Treat Not Completed: Patient declined, no reason specified Patient declined participating in therapy at this time and states "i'm going home today". There is no order for d/c noted. PT will continue to follow acutely.    Earney Navy, PTA Acute Rehabilitation Services Pager: [phone] Office: [phone]   10/23/2019, 2:00 PM

## 2019-10-23 NOTE — Progress Notes (Signed)
Dr. Sloan Leiter asked to check bladder scan which was >999 ml. Dr. Sloan Leiter talked patient  regarding his condition (stage IV colon ca) and planing to sent  to SNF. Inserted F-cath. Patient told that he called his wife to get him from hospital. Explained him that he is planning to be discharge to SNF. He insisted to go home and he stated that doctor told he can go home today. Called wife regarding this matter. She already talked to doctor and she was not coming to hospital to pick him up. She explain that she had hip surgery and weak to take care of him. Patient was getting upset and wanted to talk to doctor. He thought medical staff hold him in the hospital. Dr. Sloan Leiter talked patient again and explained to him. He denied that he agree to go to SNF in the morning. He took off cardiac monitor and put on the his pants. Asked Director Meka to talk and make him calm. In mean time called pt's wife again, she will pick him up tomorrow. Patient was calm down and explained him that he can go home tomorrow. He was crying and sorry to doctor & this nurse about his behavior. Gave him emotional support. HS [employer]

## 2019-10-23 NOTE — Progress Notes (Signed)
Patient had ST as 160's very short period then got back down to 70's. Continue to monitor HR. HS [employer]

## 2019-10-24 ENCOUNTER — Institutional Professional Consult (permissible substitution): Payer: Medicare Other | Admitting: Pulmonary Disease

## 2019-10-24 DIAGNOSIS — Z66 Do not resuscitate: Secondary | ICD-10-CM

## 2019-10-24 DIAGNOSIS — R160 Hepatomegaly, not elsewhere classified: Secondary | ICD-10-CM

## 2019-10-24 DIAGNOSIS — Z515 Encounter for palliative care: Secondary | ICD-10-CM

## 2019-10-24 DIAGNOSIS — R531 Weakness: Secondary | ICD-10-CM

## 2019-10-24 DIAGNOSIS — E43 Unspecified severe protein-calorie malnutrition: Secondary | ICD-10-CM

## 2019-10-24 NOTE — Progress Notes (Signed)
OT Cancellation Note  Patient Details Name: Tayron Micco MRN: [id number] DOB: 02-Aug-1941   Cancelled Treatment:    Reason Eval/Treat Not Completed: Patient declined, no reason specified; pt declined participation in OT session or OOB activity at this time, reports he will get up later today. Pt pleasant, but declining all efforts made. Of note, noted pt to return home with hospice services (possibly tomorrow). Will follow up as able for OT treatment.  Lou Cal, OT Supplemental Rehabilitation Services Pager [phone] Office [place] 10/24/2019, 3:13 PM

## 2019-10-24 NOTE — Progress Notes (Signed)
Responded to palliative spiritual care consult.  Nurse stated he was supposed too be discharged today but, it was decided the plan is to discharge tomorrow. PT was alert and talkative.No family present. Charles Espinoza stated the heaviness of his recent health. He talked about how quick everything is changing. He said that his life was very good and he enjoyed being in the [place]. He said he very involved in his faith. I encouraged him to think of how he can bless today for his family. He was very receptive, tearful and engaged. [name] expressed his gratitude for the Chaplain presence. I offered spiritual care with words of comfort, empathic listening, prayer and ministry of presence.  Palliative Care  Chaplain Resident  Fidel Levy [phone]

## 2019-10-24 NOTE — Progress Notes (Addendum)
Manufacturing engineer [facility]) Hospital Liaison:  RN Note   Notified by Pricilla Riffle TOC/CMRN, of patient/family request for [facility] services at home after discharge. Chart and patient information under review by [facility] physician. Hospice eligibility pending at this time.  Writer spoke with patient spouse Charles Espinoza) via telephone to initiate education related to hospice philosophy, services and team approach to care.  Patient spouse verbalized understanding of information given.  Per discussion, plan is for discharge to home by [facility] tomorrow. Patient spouse is aware that due to [place] that [facility] volunteer and aide services may be limited and nurse visits may include virtual visits.   . Please send signed and completed DNR form home with patient/family.    Patient will need prescriptions for discharge comfort medications.  DME needs have been discussed. Patient currently has the following equipment in the home: walker.  Patient/family requests the following DME for delivery to the home: wheelchair, [facility] and over the bed tray.  [place] DME specialist has been notified and will contact [employer] to arrange delivery to the home. Home address has been verified and is correct in the chart.  Spouse Charles Espinoza is the family member to contact to arrange time of delivery.  [facility] Referral Center aware of the above.    Please fax completed discharge summary to [facility] at [phone] when final. Please notify [place] when patient is ready to leave the unit at discharge. (Call [phone] between 8:30 and 5pm. Call [phone] after 5pm.) [facility] information and contact numbers were given to spouse during our conversation.  Above information shared with [name] [place]/TOC  Please call with hospice related questions.  Thank you for this referral,  Gar Ponto, RN [facility] Liaison [phone] [facility] Liaisons are listed on [facility])

## 2019-10-24 NOTE — Progress Notes (Signed)
Nutrition Follow-up  DOCUMENTATION CODES:   Severe malnutrition in context of chronic illness  INTERVENTION:  - continue 10 AM and 2 PM snacks. - continue magic cup TID. - continue to encourage PO intakes.    NUTRITION DIAGNOSIS:   Severe Malnutrition related to cancer and cancer related treatments, chronic illness as evidenced by severe fat depletion, severe muscle depletion. -revised   GOAL:   Patient will meet greater than or equal to 90% of their needs -met on average   MONITOR:   PO intake, Supplement acceptance, Labs, Weight trends, Skin  ASSESSMENT:   78 year old male who presented to the ED after pulling out Foley and having fever and hypotension. PMH recent sigmoid malignant ulceration with subsequent perforation s/p ex-lap with diverting colostomy, history of prostate cancer and likely recently diagnosed metastatic colorectal cancer, COPD, HLD. Pt admitted with severe sepsis secondary to postop rectal abscess vs UTI/pyelo.  Weight beginning to trend back down. Per flow sheet, patient consumed the following: 11/1: 50% lunch (392 kcal, 11 grams protein) 11/2: 90% lunch (420 kcal, 24 grams protein) 11/3: 50 % breakfast, 70% lunch and dinner (total of  882 kcal, 44 grams protein) 11/4: 25% breakfast and dinner (total of 253 kcal, 13 grams protein) 11/5: 100% breakfast (469 kcal, 29 grams protein  He reports also eating items during snacks and that he greatly enjoys ice cream and has been receiving and consuming magic cups. Patient states multiple times that he does not have much longer to live but that he feels well and feels his appetite is very good. Most of the time with patient was spent listening or talking about his family.   Notes indicate that patient has stage 4 colon cancer with mets to liver, lung, and rectum. He is not a chemo candidate. Plan is for home with hospice and patient informs this RD that he is likely going home tomorrow.    Labs reviewed; BUN: <5  mg/dl, Ca: 7.5 mg/dl, Phos: 2.2 mg/dl.  Medications reviewed; 100 mg colace once/day, 400 mg mag-ox BID, daily multivitamin with minerals, 1 packet phos-nak TID.       NUTRITION - FOCUSED PHYSICAL EXAM:    Most Recent Value  Orbital Region  Severe depletion  Upper Arm Region  Moderate depletion  Thoracic and Lumbar Region  Unable to assess  Buccal Region  Severe depletion  Temple Region  Moderate depletion  Clavicle Bone Region  Severe depletion  Clavicle and Acromion Bone Region  Severe depletion  Scapular Bone Region  Severe depletion  Dorsal Hand  Severe depletion  Patellar Region  Unable to assess  Anterior Thigh Region  Unable to assess  Posterior Calf Region  Unable to assess  Edema (RD Assessment)  Unable to assess  Hair  Reviewed  Eyes  Reviewed  Mouth  Reviewed  Skin  Reviewed  Nails  Reviewed       Diet Order:   Diet Order            Diet Heart Room service appropriate? Yes; Fluid consistency: Thin  Diet effective now              EDUCATION NEEDS:   No education needs have been identified at this time  Skin:  Skin Assessment: Skin Integrity Issues: Skin Integrity Issues:: Stage II, Incisions Stage II: sacrum Incisions: abdomen (10/28)  Last BM:  10/4 (colostomy)  Height:   Ht Readings from Last 1 Encounters:  10/18/19 5' 7.99" (1.727 m)    Weight:  Wt Readings from Last 1 Encounters:  10/24/19 58.4 kg    Ideal Body Weight:  70 kg  BMI:  Body mass index is 19.58 kg/m.  Estimated Nutritional Needs:   Kcal:  1800-2000  Protein:  80-100 grams  Fluid:  >/= 1.8 L     Jarome Matin, MS, RD, LDN, [facility] Inpatient Clinical Dietitian Pager # [phone] After hours/weekend pager # [phone]

## 2019-10-24 NOTE — Consult Note (Signed)
Consultation Note Date: 10/24/2019   Patient Name: Charles Espinoza  DOB: 09-14-1941  MRN: [id number]  Age / Sex: 78 y.o., male  PCP: Rogers Blocker, MD Referring Physician: Barb Merino, MD  Reason for Consultation: Establishing goals of care and Psychosocial/spiritual support  HPI/Patient Profile: 78 y.o. 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    In the emergency room, blood pressure 66/44, temperature 102.  CT head negative.  CTA chest, left lung density likely metastatic disease.  CT abdomen pelvis with postoperative changes, 3 x 2 cm fluid collection adjacent to the rectal mass.    Patient has had slow continued physical and functional decline over the last many months.  At this time in discussion with his wife they have made decision to focus care on comfort and dignity foregoing any life prolonging measures.  Patient and family face treatment option decisions, advanced directive decisions and anticipatory care needs at home.  Clinical Assessment and Goals of Care:  This NP Wadie Lessen reviewed medical records, received report from team, assessed the patient and then meet at the patient's bedside along with   to discuss diagnosis, prognosis, GOC, EOL wishes disposition and options.  Concept of Hospice and Palliative Care were discussed  A detailed discussion was had today regarding advanced directives.  Concepts specific to code status, artifical feeding and hydration, continued IV antibiotics and rehospitalization was had.  The difference between a aggressive medical intervention path  and a palliative comfort care path for  this patient at this time was had.  Values and goals of care important to patient and family were attempted to be elicited.  Created space and opportunity for patient and his wife to explore thoughts and feelings regarding current medical situation.  Patient's main focus is to get home as soon as possible.  His wife however verbalizes concern over for anticipatory care needs and increasing care needs that she will ultimately be responsible for.  She verbalizes that sometimes she feels unappreciated and feels distant from her husband at time's making it hard to care for him at this time in his life.  Emotional support offered and encouraged her to utilize social work and support through hospice benefit once her husband returns home.   Natural trajectory and expectations at EOL were discussed.  Questions and concerns addressed.   Family encouraged to call with questions or concerns.    PMT will continue to support holistically.     SUMMARY OF RECOMMENDATIONS    Code Status/Advance Care Planning:  DNR-documented today   Palliative Prophylaxis:   Bowel Regimen, Delirium Protocol, Frequent Pain Assessment and Oral Care  Additional Recommendations (Limitations, Scope, Preferences):  Avoid Hospitalization, Full Comfort Care, No Artificial Feeding, No Chemotherapy, No Diagnostics and No Lab Draws  Psycho-social/Spiritual:   Desire  for further Chaplaincy support:yes  Additional Recommendations: Education on Hospice  Prognosis:   < 6 months  Discharge Planning: Will need transport home.   Home with Hospice      Primary Diagnoses: Present on Admission: . Sepsis ([place]) . Malignant neoplasm of prostate ([place]) . Liver mass . Colon obstruction ([place]) . 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 The following aspects are pertinent.  Past Medical History:  Diagnosis Date  .  Colon cancer ([place])   . COPD (chronic obstructive pulmonary disease) ([place])   . Elevated PSA   . Hx of radiation therapy 09/25/13- 11/27/13   prostate 7800 cGy 39 sessions  . Hyperlipidemia   . Prostate cancer ([place]) 02/27/13   gleason 6, volume 133 cc  . Prostatitis   . Sigmoid volvulus ([place])    s/p ex-lap  . Vitamin D deficiency    Social History   Socioeconomic History  . Marital status: Married    Spouse name: Not on file  . Number of children: 3  . Years of education: Not on file  . Highest education level: Not on file  Occupational History  . Occupation: Curator, retired  Scientific laboratory technician  . Financial resource strain: Not on file  . Food insecurity    Worry: Not on file    Inability: Not on file  . Transportation needs    Medical: Not on file    Non-medical: Not on file  Tobacco Use  . Smoking status: Current Every Day Smoker    Packs/day: 1.00    Years: 60.00    Pack years: 60.00  . Smokeless tobacco: Never Used  Substance and Sexual Activity  . Alcohol use: Yes    Frequency: Never    Comment: Social  . Drug use: Yes    Types: Marijuana    Comment: Daily x50 years  . Sexual activity: Not on file  Lifestyle  . Physical activity    Days per week: Not on file    Minutes per session: Not on file  . Stress: Not on file  Relationships  . Social Herbalist on phone: Not on file    Gets together: Not on file    Attends religious service: Not on file    Active member of club or organization: Not on file    Attends meetings of clubs or organizations: Not on file    Relationship status: Not on file  Other Topics Concern  . Not on file  Social History Narrative  . Not on file   Family History  Problem Relation Age of Onset  . Prostate cancer Other    Scheduled Meds: . amoxicillin-clavulanate  1 tablet Oral Q12H  . Chlorhexidine Gluconate Cloth  6 each Topical Daily  . docusate sodium  100 mg Oral Daily  . heparin  5,000 Units Subcutaneous Q8H  .  magnesium oxide  400 mg Oral BID  . multivitamin with minerals  1 tablet Oral Daily  . potassium & sodium phosphates  1 packet Oral TID WC & HS   Continuous Infusions: . sodium chloride Stopped (10/19/19 0946)   PRN Meds:.acetaminophen, diphenhydrAMINE, EPINEPHrine, loperamide, oxyCODONE Medications Prior to Admission:  Prior to Admission medications   Medication Sig Start Date End Date Taking? Authorizing Provider  Difluprednate (DUREZOL) 0.05 % EMUL Place 1 drop into the left eye daily.    [provider]  feeding  supplement, ENSURE ENLIVE, (ENSURE ENLIVE) LIQD Take 237 mLs by mouth 3 (three) times daily between meals. 10/05/19   Cherylann Ratel A, DO  ferrous sulfate 325 (65 FE) MG tablet Take 325 mg by mouth 2 (two) times daily.    [provider]  Multiple Vitamin (MULTIVITAMIN WITH MINERALS) TABS tablet Take 1 tablet by mouth daily. 10/06/19 11/05/19  Cherylann Ratel A, DO  polyethylene glycol (MIRALAX / GLYCOLAX) 17 g packet Take 17 g by mouth daily. 10/06/19   Cherylann Ratel A, DO  tamsulosin (FLOMAX) 0.4 MG CAPS capsule Take 1 capsule (0.4 mg total) by mouth daily. 10/06/19 12/05/19  Cherylann Ratel A, DO  vitamin C (ASCORBIC ACID) 500 MG tablet Take 500 mg by mouth 2 (two) times daily.    [provider]   No Active Allergies Review of Systems  Neurological: Positive for weakness.    Physical Exam Pulmonary:     Effort: Pulmonary effort is normal.  Abdominal:     Comments: Noted colostomy  Skin:    General: Skin is warm and dry.  Neurological:     Mental Status: He is alert.     Vital Signs: BP 104/66 (BP Location: Left Arm)   Pulse 86   Temp 97.6 F (36.4 C) (Oral)   Resp 20   Ht 5' 7.99" (1.727 m)   Wt 58.4 kg   SpO2 95%   BMI 19.58 kg/m  Pain Scale: 0-10 POSS *See Group Information*: S-Acceptable,Sleep, easy to arouse Pain Score: Asleep   SpO2: SpO2: 95 % O2 Device:SpO2: 95 % O2 Flow Rate: .   IO: Intake/output summary:    Intake/Output Summary (Last 24 hours) at 10/24/2019 0953 Last data filed at 10/24/2019 0758 Gross per 24 hour  Intake 360 ml  Output 1550 ml  Net -1190 ml    LBM: Last BM Date: 10/21/19 Baseline Weight: Weight: 55.3 kg Most recent weight: Weight: 58.4 kg     Palliative Assessment/Data: 50 %   Discussed with dr Estell Harpin  Time In: 1145 Time Out: 1300 Time Total: 75 minutes Greater than 50%  of this time was spent counseling and coordinating care related to the above assessment and plan.  Signed by: Wadie Lessen, NP   Please contact Palliative Medicine Team phone at [phone] for questions and concerns.  For individual provider: See Shea Evans

## 2019-10-24 NOTE — TOC Initial Note (Addendum)
Transition of Care [facility]) - Initial/Assessment Note    Patient Details  Name: Charles Espinoza MRN: [id number] Date of Birth: January 26, 1941  [facility] CM/SW Contact:    Alberteen Sam, [place] Phone Number: [phone] 10/24/2019, 9:56 AM  Clinical Narrative:                  CSW made referral with Venia Carbon with [facility] to initiate home hospice in anticipation of discharge. CSW will continue to follow for discharge planning needs. Anderson Malta to discuss discharge plan with patient's wife and any equipment needs at home prior to discharge.   Expected Discharge Plan: Home w Hospice Care Barriers to Discharge: No Barriers Identified   Patient Goals and CMS Choice        Expected Discharge Plan and Services Expected Discharge Plan: [place] Acute Care Choice: Hospice Living arrangements for the past 2 months: Skilled Nursing Facility(from [facility])                                      Prior Living Arrangements/Services Living arrangements for the past 2 months: Skilled Nursing Facility(from [facility]) Lives with:: Spouse Patient language and need for interpreter reviewed:: Yes Do you feel safe going back to the place where you live?: Yes      Need for Family Participation in Patient Care: Yes (Comment) Care giver support system in place?: Yes (comment)   Criminal Activity/Legal Involvement Pertinent to Current Situation/Hospitalization: No - Comment as needed  Activities of Daily Living Home Assistive Devices/Equipment: None, Other (Comment)(has access to aide devices at SNF) ADL Screening (condition at time of admission) Patient's cognitive ability adequate to safely complete daily activities?: No Is the patient deaf or have difficulty hearing?: No Does the patient have difficulty seeing, even when wearing glasses/contacts?: No Does the patient have difficulty concentrating, remembering, or making decisions?: Yes Patient  able to express need for assistance with ADLs?: Yes Does the patient have difficulty dressing or bathing?: Yes Independently performs ADLs?: No Communication: Independent Dressing (OT): Needs assistance Is this a change from baseline?: Pre-admission baseline Grooming: Needs assistance Is this a change from baseline?: Pre-admission baseline Feeding: Independent Bathing: Needs assistance Is this a change from baseline?: Pre-admission baseline Toileting: Needs assistance Is this a change from baseline?: Pre-admission baseline In/Out Bed: Needs assistance Is this a change from baseline?: Pre-admission baseline Walks in Home: Independent Does the patient have difficulty walking or climbing stairs?: No Weakness of Legs: Both Weakness of Arms/Hands: Right  Permission Sought/Granted Permission sought to share information with : Case Manager, Customer service manager, Family Supports Permission granted to share information with : Yes, Verbal Permission Granted  Share Information with NAME: Danton Clap  Permission granted to share info w AGENCY: Hospice  Permission granted to share info w Relationship: spouse  Permission granted to share info w Contact Information: [phone]  Emotional Assessment Appearance:: Appears stated age Attitude/Demeanor/Rapport: Unable to Assess Affect (typically observed): Unable to Assess Orientation: : Oriented to Self, Oriented to Place, Oriented to  Time, Oriented to Situation Alcohol / Substance Use: Not Applicable Psych Involvement: No (comment)  Admission diagnosis:  SEPSIS  FEVER Patient Active Problem List   Diagnosis Date Noted  . Enterococcal bacteremia 10/18/2019  . Rectal abscess 10/18/2019  . Sepsis ([place]) 10/16/2019  . 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 Pressure injury of  skin 10/04/2019  . Colon obstruction ([place])   . Palliative care encounter   . Protein-calorie malnutrition, severe 09/26/2019  .  Abdominal distension   . Liver mass 09/19/2019  . Colonic mass 09/19/2019  . Mass of colon 09/19/2019  . Malignant neoplasm of prostate ([place]) 08/15/2013   PCP:  Rogers Blocker, MD Pharmacy:   [facility] [place], [address] [address] Phone: [phone] Fax: [phone]     Social Determinants of Health (SDOH) Interventions    Readmission Risk Interventions Readmission Risk Prevention Plan 10/03/2019  Transportation Screening Complete  PCP or Specialist Appt within 5-7 Days Not Complete  Not Complete comments plan for SNF  Home Care Screening Complete  Medication Review (RN CM) Complete  Some recent data might be hidden

## 2019-10-24 NOTE — Progress Notes (Signed)
PT Cancellation Note  Patient Details Name: Charles Espinoza MRN: [id number] DOB: [birth year]   Cancelled Treatment:    Reason Eval/Treat Not Completed: Patient declined, no reason specified Patient declined participating in therapy at this time. Noted d/c plan is home with hospice care possibly tomorrow. OOB encouraged with assist from nursing staff. PT will continue to follow acutely.    Earney Navy, PTA Acute Rehabilitation Services Pager: [phone] Office: [phone]   10/24/2019, 3:39 PM

## 2019-10-24 NOTE — Progress Notes (Signed)
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  In the emergency room, blood pressure 66/44, temperature 102.  CT head negative.  CTA chest, left lung density likely metastatic disease.  CT abdomen pelvis with postoperative changes, 3 x 2 cm fluid collection adjacent to the rectal mass.  He was treated with vasopressors and admitted to ICU. 10/15/2019, blood cultures with Enterococcus faecalis 10/15/2019, blood cultures positive for coag negative Staphylococcus, 2 out of 4 from same bottle Urine cultures growing Pseudomonas aeruginosa, Enterococcus. C. difficile antigen positive, toxin negative. Transferred out of ICU on 10/20/2019 with stabilization.  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 Admitted to ICU and treated with Levophed and subsequently stabilized.  Blood pressure stabilized. Right kidney patchy enhancement on CT scan, suspicious for pyelonephritis .  Foley replaced in the ER.  Echocardiogram normal.  Fluid collection not large enough for drain as per surgery. Enterococcus faecalis bacteremia, repeat cultures negative, currently remains on Zosyn. Urine culture with Pseudomonas,  TEE with no evidence of endocarditis. Augmentin until  10/27/2019 as suggested by infectious disease for total 10 days of therapy after negative cultures.  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 Followed by oncology outpatient.  Currently not ready for any treatment.  Outpatient follow-up after improvement. Poor understanding of the disease.  Patient not chemotherapy candidate at this time.   Seen by palliative care.   Patient and family agreed to go home with hospice care in place. DNR/DNI. Unlikely patient is palliative chemotherapy candidate.  Hypokalemia: Replaced.  Improved.  Marland Kitchen  Hypomagnesemia: Replace and improved.  Urinary retention: Voiding trial given.  Patient retained more than 1 liters .  No more voiding trial.  Insert and maintain Foley catheter.  Outpatient urology follow-up if adequate improvement, otherwise he will continue to have Foley catheter for hospice care.  Discussed with family.  Discussed with palliative care team.   DVT prophylaxis: Subcu heparin Code Status: DNR Family Communication: Wife Disposition Plan: Home anticipate tomorrow with hospice in place after arrangements are made.   Consultants:   Infectious disease  PCCM  Surgery  Procedures:   None  Antimicrobials:   Vancomycin 10/16/2019-10/29  Cefepime, 10/28--10/29  Flagyl, 10/28-10/29  Unasyn 10/29-10/31  Zosyn, 10/31------ 10/23/2019  Augmentin, 10/23/2019---   Subjective: Patient seen and examined.  No overnight events.  Denies any pain.  Denies nausea vomiting.  He was very anxious and agitated yesterday and wanting to walk out of the hospital, after much counseling stayed back to make safe discharge planning.  Today he is more conversant and understands that I will discharge him home after adequate arrangements are made for him. Objective: Vitals:   10/23/19 1900 10/24/19 0019 10/24/19 0341 10/24/19 0723  BP: 106/67 112/78 108/61  104/66  Pulse:  82 80 86  Resp:  20 19 20   Temp: 98.2 F (36.8 C) 97.8 F (36.6 C) 98 F (36.7 C) 97.6 F (36.4 C)  TempSrc: Oral Oral Oral Oral  SpO2: 100% 93%  95%  Weight:   58.4 kg   Height:        Intake/Output Summary (Last 24 hours) at 10/24/2019 1145 Last data filed at 10/24/2019 0758 Gross per 24 hour  Intake 360 ml  Output 1550 ml  Net -1190 ml   Filed Weights   10/22/19 0517 10/23/19 0315 10/24/19 0341  Weight: 60 kg 60.3 kg 58.4 kg    Examination:  Physical Exam  Constitutional: He is oriented to person, place, and time. No distress.  Chronically sick looking.  On room air.  Not in any distress.  HENT:  Head: Normocephalic and atraumatic.  Neck: Normal range of motion. No thyromegaly present.  Cardiovascular: Normal rate and regular rhythm.  Pulmonary/Chest: Effort normal and breath sounds normal. No respiratory distress.  Abdominal: Bowel sounds are normal. He exhibits no distension. There is abdominal tenderness.  Mild tenderness along the lower abdomen.  Left lower quadrant colostomy bag with loose stool.  Midline surgical incision with some drainage, mostly dry.  He does have some suprapubic tenderness.  Foley catheter with clear urine.  Musculoskeletal:        General: No deformity or edema.  Neurological: He is alert and oriented to person, place, and time.  Skin: He is not diaphoretic.  Psychiatric: Mood, affect and judgment normal.      Data Reviewed: I have personally reviewed following labs and imaging studies  CBC: Recent Labs  Lab 10/19/19 0448 10/20/19 0456 10/21/19 0221 10/22/19 0358 10/23/19 0500  WBC 10.1 10.0 8.1 6.0 5.4  NEUTROABS  --   --   --  4.5 4.1  HGB 8.2* 7.9* 8.3* 7.5* 8.3*  HCT 25.7* 24.5* 25.3* 24.1* 26.5*  MCV 95.5 93.9 94.4 97.6 97.8  PLT 248 202 261 273 [id number]   Basic Metabolic Panel: Recent Labs  Lab 10/18/19 0425 10/19/19 0448 10/20/19 0456 10/21/19 0221 10/22/19 0358  NA 140 141 141 141 140  K 3.6 2.9*  3.6 4.0 3.8  CL 109 108 111 112* 109  CO2 24 25 24 24 25   GLUCOSE 98 93 88 97 88  BUN 12 7* 6* 6* <5*  CREATININE 0.84 0.80 0.84 0.85 0.90  CALCIUM 7.6* 7.4* 7.4* 7.5* 7.5*  MG 1.5* 1.5* 1.8 1.6* 1.7  PHOS 1.3*  --   --   --  2.2*   GFR: Estimated Creatinine Clearance: 54.1 mL/min (by C-G formula based on SCr of 0.9 mg/dL). Liver Function Tests: No results for input(s): AST, ALT, ALKPHOS, BILITOT, PROT, ALBUMIN in the last 168 hours. No results for input(s): LIPASE, AMYLASE in the last 168 hours. No results for input(s): AMMONIA in the last 168 hours. Coagulation Profile: No results for input(s): INR, PROTIME in the last 168 hours. Cardiac Enzymes: No results for input(s): CKTOTAL, CKMB, CKMBINDEX, TROPONINI in the last 168 hours. BNP (last 3 results) No results for input(s): PROBNP in the last 8760 hours. HbA1C: No results for input(s): HGBA1C in the last 72 hours. CBG: Recent Labs  Lab 10/18/19 1557  GLUCAP 154*   Lipid Profile: No results for input(s): CHOL, HDL, LDLCALC, TRIG, CHOLHDL, LDLDIRECT in the last 72 hours. Thyroid Function Tests: No results for input(s): TSH, T4TOTAL, FREET4, T3FREE, THYROIDAB in the last 72 hours. Anemia Panel: No  results for input(s): VITAMINB12, FOLATE, FERRITIN, TIBC, IRON, RETICCTPCT in the last 72 hours. Sepsis Labs: No results for input(s): PROCALCITON, LATICACIDVEN in the last 168 hours.  Recent Results (from the past 240 hour(s))  Urine culture     Status: Abnormal   Collection Time: 10/15/19  4:25 PM   Specimen: Urine, Catheterized  Result Value Ref Range Status   Specimen Description   Final    URINE, CATHETERIZED Performed at [facility], [address], [place], [place] [address]    Special Requests   Final    NONE Performed at [facility], [address] [place], [place] [address]    Culture (A)  Final    70,000 COLONIES/mL PSEUDOMONAS AERUGINOSA 80,000 COLONIES/mL ENTEROCOCCUS FAECALIS     Report Status 10/18/2019 FINAL  Final   Organism ID, Bacteria PSEUDOMONAS AERUGINOSA (A)  Final   Organism ID, Bacteria ENTEROCOCCUS FAECALIS (A)  Final      Susceptibility   Enterococcus faecalis - MIC*    AMPICILLIN <=2 SENSITIVE Sensitive     LEVOFLOXACIN 0.5 SENSITIVE Sensitive     NITROFURANTOIN <=16 SENSITIVE Sensitive     VANCOMYCIN 1 SENSITIVE Sensitive     * 80,000 COLONIES/mL ENTEROCOCCUS FAECALIS   Pseudomonas aeruginosa - MIC*    CEFTAZIDIME 4 SENSITIVE Sensitive     CIPROFLOXACIN <=0.25 SENSITIVE Sensitive     GENTAMICIN 8 INTERMEDIATE Intermediate     IMIPENEM 2 SENSITIVE Sensitive     PIP/TAZO 16 SENSITIVE Sensitive     CEFEPIME 8 SENSITIVE Sensitive     * 70,000 COLONIES/mL PSEUDOMONAS AERUGINOSA  Blood culture (routine x 2)     Status: Abnormal   Collection Time: 10/15/19  4:25 PM   Specimen: BLOOD  Result Value Ref Range Status   Specimen Description   Final    BLOOD LEFT ANTECUBITAL Performed at [facility], [address] [place], [place] [address]    Special Requests   Final    BOTTLES DRAWN AEROBIC AND ANAEROBIC Blood Culture results may not be optimal due to an inadequate volume of blood received in culture bottles Performed at [facility], [address] [place], [place] [address]    Culture  Setup Time   Final    IN BOTH AEROBIC AND ANAEROBIC BOTTLES GRAM POSITIVE COCCI CRITICAL RESULT CALLED TO, READ BACK BY AND VERIFIED WITH: SCOTT HALL AT [phone] ON 10/16/2019 JJB Performed at [facility], [address] [place], [place] [address]    Culture (A)  Final    ENTEROCOCCUS FAECALIS STAPHYLOCOCCUS SPECIES (COAGULASE NEGATIVE) THE SIGNIFICANCE OF ISOLATING THIS ORGANISM FROM A SINGLE SET OF BLOOD CULTURES WHEN MULTIPLE SETS ARE DRAWN IS UNCERTAIN. PLEASE NOTIFY THE MICROBIOLOGY DEPARTMENT WITHIN ONE WEEK IF SPECIATION AND SENSITIVITIES ARE REQUIRED. CRITICAL RESULT CALLED TO, READ BACK BY AND VERIFIED WITH: PHARMD Cleotis Lema  AUMEISTER [id number] FCP Performed at [facility], [place] [address], [place],  [address]    Report Status 10/20/2019 FINAL  Final   Organism ID, Bacteria ENTEROCOCCUS FAECALIS  Final      Susceptibility   Enterococcus faecalis - MIC*    AMPICILLIN <=2 SENSITIVE Sensitive     VANCOMYCIN 1 SENSITIVE Sensitive     GENTAMICIN SYNERGY SENSITIVE Sensitive     * ENTEROCOCCUS FAECALIS  Blood culture (routine x 2)     Status: None   Collection Time: 10/15/19  4:29 PM   Specimen: BLOOD  Result Value Ref Range Status   Specimen Description  BLOOD BLOOD RIGHT FOREARM  Final   Special Requests   Final    BOTTLES DRAWN AEROBIC AND ANAEROBIC Blood Culture results may not be optimal due to an inadequate volume of blood received in culture bottles   Culture   Final    NO GROWTH 5 DAYS Performed at [facility], [address], [place], [place] [address]    Report Status 10/20/2019 FINAL  Final  SARS Coronavirus 2 by RT PCR (hospital order, performed in [place] hospital lab) Nasopharyngeal Nasopharyngeal Swab     Status: None   Collection Time: 10/15/19  4:29 PM   Specimen: Nasopharyngeal Swab  Result Value Ref Range Status   SARS Coronavirus 2 NEGATIVE NEGATIVE Final    Comment: (NOTE) If result is NEGATIVE SARS-CoV-2 target nucleic acids are NOT DETECTED. The SARS-CoV-2 RNA is generally detectable in upper and lower  respiratory specimens during the acute phase of infection. The lowest  concentration of SARS-CoV-2 viral copies this assay can detect is 250  copies / mL. A negative result does not preclude SARS-CoV-2 infection  and should not be used as the sole basis for treatment or other  patient management decisions.  A negative result may occur with  improper specimen collection / handling, submission of specimen other  than nasopharyngeal swab, presence of viral mutation(s) within the  areas targeted by this assay, and inadequate number of viral copies  (<250  copies / mL). A negative result must be combined with clinical  observations, patient history, and epidemiological information. If result is POSITIVE SARS-CoV-2 target nucleic acids are DETECTED. The SARS-CoV-2 RNA is generally detectable in upper and lower  respiratory specimens dur ing the acute phase of infection.  Positive  results are indicative of active infection with SARS-CoV-2.  Clinical  correlation with patient history and other diagnostic information is  necessary to determine patient infection status.  Positive results do  not rule out bacterial infection or co-infection with other viruses. If result is PRESUMPTIVE POSTIVE SARS-CoV-2 nucleic acids MAY BE PRESENT.   A presumptive positive result was obtained on the submitted specimen  and confirmed on repeat testing.  While 2019 novel coronavirus  (SARS-CoV-2) nucleic acids may be present in the submitted sample  additional confirmatory testing may be necessary for epidemiological  and / or clinical management purposes  to differentiate between  SARS-CoV-2 and other Sarbecovirus currently known to infect humans.  If clinically indicated additional testing with an alternate test  methodology [phone]) is advised. The SARS-CoV-2 RNA is generally  detectable in upper and lower respiratory sp ecimens during the acute  phase of infection. The expected result is Negative. Fact Sheet for Patients:  StrictlyIdeas.no Fact Sheet for Healthcare Providers: BankingDealers.co.za This test is not yet approved or cleared by the [place] FDA and has been authorized for detection and/or diagnosis of SARS-CoV-2 by FDA under an Emergency Use Authorization (EUA).  This EUA will remain in effect (meaning this test can be used) for the duration of the COVID-19 declaration under Section 564(b)(1) of the Act, 21 U.S.C. section 360bbb-3(b)(1), unless the authorization is terminated or revoked  sooner. Performed at [facility], [address] [place], [place] [address]   GI pathogen panel by PCR, stool     Status: None   Collection Time: 10/15/19 10:00 PM   Specimen: Stool  Result Value Ref Range Status   Plesiomonas shigelloides NOT DETECTED NOT DETECTED Final   Yersinia enterocolitica NOT DETECTED NOT DETECTED Final   Vibrio  NOT DETECTED NOT DETECTED Final   Enteropathogenic E coli NOT DETECTED NOT DETECTED Final   E coli (ETEC) LT/ST NOT DETECTED NOT DETECTED Final   E coli [id number] by PCR Not applicable NOT DETECTED Final   Cryptosporidium by PCR NOT DETECTED NOT DETECTED Final   Entamoeba histolytica NOT DETECTED NOT DETECTED Final   Adenovirus F 40/41 NOT DETECTED NOT DETECTED Final   Norovirus GI/GII NOT DETECTED NOT DETECTED Final   Sapovirus NOT DETECTED NOT DETECTED Final    Comment: (NOTE) Performed At: [facility] [place], [place] [id number] Rush Farmer MD [id number]    Vibrio cholerae NOT DETECTED NOT DETECTED Final   Campylobacter by PCR NOT DETECTED NOT DETECTED Final   Salmonella by PCR NOT DETECTED NOT DETECTED Final   E coli (STEC) NOT DETECTED NOT DETECTED Final   Enteroaggregative E coli NOT DETECTED NOT DETECTED Final   Shigella by PCR NOT DETECTED NOT DETECTED Final   Cyclospora cayetanensis NOT DETECTED NOT DETECTED Final   Astrovirus NOT DETECTED NOT DETECTED Final   G lamblia by PCR NOT DETECTED NOT DETECTED Final   Rotavirus A by PCR NOT DETECTED NOT DETECTED Final  C Difficile Quick Screen w PCR reflex     Status: Abnormal   Collection Time: 10/15/19 10:00 PM   Specimen: Stool  Result Value Ref Range Status   C Diff antigen POSITIVE (A) NEGATIVE Final   C Diff toxin NEGATIVE NEGATIVE Final   C Diff interpretation Results are indeterminate. See PCR results.  Final    Comment: Performed at [facility], [address] [place], [place] [address]  C. Diff by PCR, Reflexed     Status: None    Collection Time: 10/15/19 10:00 PM  Result Value Ref Range Status   Toxigenic C. Difficile by PCR NEGATIVE NEGATIVE Final    Comment: Patient is colonized with non toxigenic C. difficile. May not need treatment unless significant symptoms are present. Performed at [facility], [address] [place], [place] [address]   MRSA PCR Screening     Status: None   Collection Time: 10/16/19  3:25 AM   Specimen: Nasal Mucosa; Nasopharyngeal  Result Value Ref Range Status   MRSA by PCR NEGATIVE NEGATIVE Final    Comment:        The GeneXpert MRSA Assay (FDA approved for NASAL specimens only), is one component of a comprehensive MRSA colonization surveillance program. It is not intended to diagnose MRSA infection nor to guide or monitor treatment for MRSA infections. Performed at [facility], [place] [address], [place], [place] [address]   Culture, blood (routine x 2)     Status: None   Collection Time: 10/16/19  5:55 AM   Specimen: BLOOD RIGHT ARM  Result Value Ref Range Status   Specimen Description BLOOD RIGHT ARM  Final   Special Requests   Final    BOTTLES DRAWN AEROBIC AND ANAEROBIC Blood Culture results may not be optimal due to an inadequate volume of blood received in culture bottles   Culture   Final    NO GROWTH 5 DAYS Performed at [facility], [place] [address], [place], [place] [address]    Report Status 10/21/2019 FINAL  Final  Culture, blood (routine x 2)     Status: None   Collection Time: 10/16/19  5:57 AM   Specimen: BLOOD LEFT ARM  Result Value Ref Range Status   Specimen Description BLOOD LEFT ARM  Final  Special Requests   Final    BOTTLES DRAWN AEROBIC AND ANAEROBIC Blood Culture results may not be optimal due to an inadequate volume of blood received in culture bottles   Culture   Final    NO GROWTH 5 DAYS Performed at [facility], [place] [address] [place], [place] [address]    Report Status 10/21/2019 FINAL  Final  Culture, blood  (Routine X 2) w Reflex to ID Panel     Status: None   Collection Time: 10/18/19 10:39 AM   Specimen: BLOOD  Result Value Ref Range Status   Specimen Description BLOOD RIGHT ANTECUBITAL  Final   Special Requests   Final    BOTTLES DRAWN AEROBIC AND ANAEROBIC Blood Culture adequate volume   Culture   Final    NO GROWTH 5 DAYS Performed at [facility], [place] [address], [place], [place] [address]    Report Status 10/23/2019 FINAL  Final  Culture, blood (Routine X 2) w Reflex to ID Panel     Status: None   Collection Time: 10/18/19 10:39 AM   Specimen: BLOOD  Result Value Ref Range Status   Specimen Description BLOOD BLOOD RIGHT HAND  Final   Special Requests   Final    BOTTLES DRAWN AEROBIC AND ANAEROBIC Blood Culture adequate volume   Culture   Final    NO GROWTH 5 DAYS Performed at [facility], [place] [address], [place], [place] [address]    Report Status 10/23/2019 FINAL  Final         Radiology Studies: No results found.      Scheduled Meds: . amoxicillin-clavulanate  1 tablet Oral Q12H  . Chlorhexidine Gluconate Cloth  6 each Topical Daily  . docusate sodium  100 mg Oral Daily  . magnesium oxide  400 mg Oral BID  . multivitamin with minerals  1 tablet Oral Daily  . potassium & sodium phosphates  1 packet Oral TID WC & HS   Continuous Infusions: . sodium chloride Stopped (10/19/19 0946)     LOS: 8 days    Time spent: 25 minutes     Barb Merino, MD [facility] Pager [phone]

## 2019-10-25 ENCOUNTER — Telehealth: Payer: Self-pay | Admitting: Hematology

## 2019-10-25 MED ORDER — OXYCODONE HCL 5 MG PO TABS: 5.0000 mg | ORAL_TABLET | ORAL | 0 refills | Status: AC | PRN

## 2019-10-25 MED ORDER — AMOXICILLIN-POT CLAVULANATE 875-125 MG PO TABS: 1.0000 | ORAL_TABLET | Freq: Two times a day (BID) | ORAL | 0 refills | Status: AC

## 2019-10-25 NOTE — Progress Notes (Signed)
Pt refused for [facility] transport. Wanted wife to take him home. Discharge with wife. Discharge instructions given. Signed DNR form given to pt/wife. NO further questions.

## 2019-10-25 NOTE — Telephone Encounter (Signed)
Per 11/6 schedule message cancelled 11/12 appointments, patient now on hospice. Confirmed with patient.

## 2019-10-25 NOTE — Discharge Summary (Signed)
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  In the emergency room, blood pressure 66/44, temperature 102.  CT head negative.  CTA chest, left lung density likely metastatic disease.  CT abdomen pelvis with postoperative changes, 3 x 2 cm fluid collection adjacent to the rectal mass.  He was treated with vasopressors and admitted to ICU. 10/15/2019, blood cultures with Enterococcus faecalis 10/15/2019, blood cultures positive for coag negative Staphylococcus, 2 out of 4 from same bottle Urine cultures growing Pseudomonas aeruginosa, Enterococcus. C. difficile antigen positive, toxin negative. Transferred out of ICU on 10/20/2019 with stabilization.  Septic shock secondary to Enterococcus faecalis bacteremia, secondary to rectal abscess and or UTI complicated with presence of Foley catheter: Pseudomonas UTI. Adequately improved.  TEE with no evidence of endocarditis.  Treated with IV antibiotics in the hospital and changed to Augmentin.  Continue Augmentin until 10/27/2019.  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 Followed by oncology outpatient.  Currently not ready for any  treatment.   Poor understanding of the disease.  Patient not chemotherapy candidate at this time.   Seen by palliative care.  Patient and family agreed to go home with hospice care in place. DNR/DNI.   Hypokalemia: Replaced.  Improved.    Hypomagnesemia: Replace and improved.  Urinary retention: Voiding trial given.  Patient retained more than 1 liters .  No more voiding trial.  Insert and maintain Foley catheter.  Outpatient urology follow-up if adequate improvement, otherwise he will continue to have Foley catheter for hospice care.  Patient is fairly stable today.  Eagerly wants to go home.  He is going home today with family after adequate arrangements are made.  All symptom control medications available.  Patient is not in acute pain or shortness of breath.  Will provide some oxycodone for abdominal pain to use as needed.  Rest of the management will be as per hospice at home.  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[place])   Sepsis ([place])   Rectal abscess   Palliative care by specialist   DNR (do not resuscitate)   Weakness generalized    Discharge Instructions  Discharge Instructions    Diet general   Complete by: As directed    Discharge instructions   Complete by: As directed    Go home with Foley catheter   Increase activity slowly   Complete by: As directed      Allergies as of 10/25/2019   No Active Allergies     Medication List    STOP taking these medications   docusate sodium 100 MG capsule Commonly known as: COLACE   magnesium oxide 400 (241.3 Mg) MG tablet  Commonly known as: MAG-OX     TAKE these medications   amoxicillin-clavulanate 875-125 MG tablet Commonly known as: AUGMENTIN Take 1 tablet by mouth every 12 (twelve) hours for 2  days.   Durezol 0.05 % Emul Generic drug: Difluprednate Place 1 drop into the left eye daily.   feeding supplement (ENSURE ENLIVE) Liqd Take 237 mLs by mouth 3 (three) times daily between meals.   ferrous sulfate 325 (65 FE) MG tablet Take 325 mg by mouth 2 (two) times daily.   multivitamin with minerals Tabs tablet Take 1 tablet by mouth daily.   oxyCODONE 5 MG immediate release tablet Commonly known as: Oxy IR/ROXICODONE Take 1 tablet (5 mg total) by mouth every 4 (four) hours as needed for up to 5 days for moderate pain or severe pain.   polyethylene glycol 17 g packet Commonly known as: MIRALAX / GLYCOLAX Take 17 g by mouth daily.   tamsulosin 0.4 MG Caps capsule Commonly known as: FLOMAX Take 1 capsule (0.4 mg total) by mouth daily.   vitamin C 500 MG tablet Commonly known as: ASCORBIC ACID Take 500 mg by mouth 2 (two) times daily.      Follow-up Information    Coralie Keens, MD Follow up in 3 week(s).   Specialty: General Surgery Why: our office will call you with an appointment date and time Contact information: [address] [id number] [phone]          No Active Allergies  Consultations:  PCCM  Surgery  Palliative medicine   Procedures/Studies: Ct Head Wo Contrast  Result Date: 10/15/2019 CLINICAL DATA:  Altered level of consciousness. Hypotensive. Fever. No reported injury. EXAM: CT HEAD WITHOUT CONTRAST TECHNIQUE: Contiguous axial images were obtained from the base of the skull through the vertex without intravenous contrast. COMPARISON:  None. FINDINGS: Brain: No evidence of parenchymal hemorrhage or extra-axial fluid collection. No mass lesion, mass effect, or midline shift. No CT evidence of acute infarction. Generalized cerebral volume loss. Nonspecific mild subcortical and periventricular white matter hypodensity, most in keeping with chronic small vessel ischemic change. No ventriculomegaly. Vascular: No acute  abnormality. Skull: No evidence of calvarial fracture. Sinuses/Orbits: No fluid levels. Mild patchy opacification of the bilateral ethmoidal air cells. Other:  The mastoid air cells are unopacified. IMPRESSION: 1. No evidence of acute intracranial abnormality. No evidence of calvarial fracture. 2. Generalized cerebral volume loss and mild chronic small vessel ischemic changes in the cerebral white matter. 3. Mild paranasal sinusitis of uncertain chronicity. Electronically Signed   By: Ilona Sorrel M.D.   On: 10/15/2019 19:12   Ct Angio Chest Pe W And/or Wo Contrast  Result Date: 10/15/2019 CLINICAL DATA:  Pain and fevers EXAM: CT ANGIOGRAPHY CHEST CT ABDOMEN AND PELVIS WITH CONTRAST TECHNIQUE: Multidetector CT imaging of the chest was performed using the standard protocol during bolus administration of intravenous contrast. Multiplanar CT image reconstructions and MIPs were obtained to evaluate the vascular anatomy. Multidetector CT imaging of the abdomen and pelvis was performed using the standard protocol during bolus administration of intravenous contrast. CONTRAST:  75 mL Omnipaque 350 COMPARISON:  09/22/2019, 09/20/2019 FINDINGS: CTA CHEST FINDINGS Cardiovascular: Thoracic aorta and its branches demonstrate atherosclerotic calcifications. No aneurysmal dilatation or dissection is seen. No cardiac enlargement is noted. No significant coronary calcifications are seen. Pulmonary artery is well visualized without intraluminal filling defect to suggest pulmonary embolism. Mediastinum/Nodes: The thoracic inlet shows no focal abnormality. The known left thyroid lesion is not as well visualized  due to scatter artifact and enhancement in the thyroid. No significant hilar or mediastinal adenopathy is noted. The esophagus as visualized is within normal limits. Lungs/Pleura: The right lung is well aerated without focal infiltrate or sizable effusion. The left lung demonstrates confluent density arising from the left  suprahilar region extending superiorly into the upper lobe. Occlusion of the upper lobe bronchus is noted with some indentation upon the pulmonary artery on the left. This may be related to metastatic disease with peripheral consolidation although the possibility of a primary lesion could not be totally excluded. This is stable from a prior CT examination from 09/20/2019. This area measures at least 6.8 x 4.3 cm. No other focal mass is noted. No sizable effusion is seen. Musculoskeletal: No chest wall abnormality. No acute or significant osseous findings. Review of the MIP images confirms the above findings. CT ABDOMEN and PELVIS FINDINGS Hepatobiliary: Liver demonstrates evidence of an 8.8 x 7.7 cm centrally necrotic mass lesion with peripheral enhancement consistent with metastatic disease. This corresponds to the recently biopsied liver lesion. No other focal lesion is identified within the liver. The gallbladder is unremarkable. Pancreas: Unremarkable. No pancreatic ductal dilatation or surrounding inflammatory changes. Spleen: Normal in size without focal abnormality. Adrenals/Urinary Tract: Adrenal glands are within normal limits. The kidneys demonstrate cystic change bilaterally. Additionally some patchy irregular enhancement of the right kidney is noted suspicious for pyelonephritis. The bladder is decompressed by Foley catheter. Stomach/Bowel: There are changes consistent with a descending loop colostomy on the left consistent with the given clinical history. The large rectal mass is again identified measuring at least 6.3 cm in transverse diameter and extending for several cm along the course of the rectosigmoid. The previously seen fluid collections in the pelvis is not well visualized and likely represented some dilated colon proximal to the obstructing lesion. Normal decompressed colon is noted proximal to the rectosigmoid mass. No obstructive changes in the large or small bowel are noted. The stomach  is within normal limits. There is however a small fluid attenuation area adjacent to the rectal mass best seen on image number 71 of series 3. This measures 3 x 2 cm and is suspicious for a small postoperative abscess. Vascular/Lymphatic: Aortic atherosclerosis. No enlarged abdominal or pelvic lymph nodes. Reproductive: Prostate is unremarkable. Other: No abdominal wall hernia or abnormality. No abdominopelvic ascites. Musculoskeletal: No acute or significant osseous findings. Review of the MIP images confirms the above findings. IMPRESSION: CTA of the chest: No evidence of pulmonary emboli. Persistent mass lesion arising from the left hilum along the mediastinum in into the upper lobe is again seen stable from prior CT examination. Again this likely represents metastatic disease with peripheral consolidation although the possibility of a primary lesion deserves consideration. The known left thyroid lesion is not well appreciated on today's exam due to scatter artifact and underlying enhancement of the thyroid. CT of the abdomen and pelvis: Persistent large rectosigmoid mass lesion consistent with the given clinical history of rectal carcinoma. The previously seen fluid collection adjacent to this mass likely represented a dilated loop of colon as no significant abscess was noted on the intraoperative evaluation and no persistent fluid collection is noted on today's exam. There is however a small 3.0 x 2.0 cm fluid collection adjacent to the rectal mass best seen on image number 71 of series 3 suspicious for a small postoperative abscess. Patchy enhancement pattern particularly within the right kidney suspicious for pyelonephritis. Metastatic disease within the posterior aspect of the right lobe  of the liver stable from previous studies. New left mid abdominal loop colostomy. Electronically Signed   By: Inez Catalina M.D.   On: 10/15/2019 19:30   Ct Abdomen Pelvis W Contrast  Result Date: 10/15/2019 CLINICAL  DATA:  Pain and fevers EXAM: CT ANGIOGRAPHY CHEST CT ABDOMEN AND PELVIS WITH CONTRAST TECHNIQUE: Multidetector CT imaging of the chest was performed using the standard protocol during bolus administration of intravenous contrast. Multiplanar CT image reconstructions and MIPs were obtained to evaluate the vascular anatomy. Multidetector CT imaging of the abdomen and pelvis was performed using the standard protocol during bolus administration of intravenous contrast. CONTRAST:  75 mL Omnipaque 350 COMPARISON:  09/22/2019, 09/20/2019 FINDINGS: CTA CHEST FINDINGS Cardiovascular: Thoracic aorta and its branches demonstrate atherosclerotic calcifications. No aneurysmal dilatation or dissection is seen. No cardiac enlargement is noted. No significant coronary calcifications are seen. Pulmonary artery is well visualized without intraluminal filling defect to suggest pulmonary embolism. Mediastinum/Nodes: The thoracic inlet shows no focal abnormality. The known left thyroid lesion is not as well visualized due to scatter artifact and enhancement in the thyroid. No significant hilar or mediastinal adenopathy is noted. The esophagus as visualized is within normal limits. Lungs/Pleura: The right lung is well aerated without focal infiltrate or sizable effusion. The left lung demonstrates confluent density arising from the left suprahilar region extending superiorly into the upper lobe. Occlusion of the upper lobe bronchus is noted with some indentation upon the pulmonary artery on the left. This may be related to metastatic disease with peripheral consolidation although the possibility of a primary lesion could not be totally excluded. This is stable from a prior CT examination from 09/20/2019. This area measures at least 6.8 x 4.3 cm. No other focal mass is noted. No sizable effusion is seen. Musculoskeletal: No chest wall abnormality. No acute or significant osseous findings. Review of the MIP images confirms the above  findings. CT ABDOMEN and PELVIS FINDINGS Hepatobiliary: Liver demonstrates evidence of an 8.8 x 7.7 cm centrally necrotic mass lesion with peripheral enhancement consistent with metastatic disease. This corresponds to the recently biopsied liver lesion. No other focal lesion is identified within the liver. The gallbladder is unremarkable. Pancreas: Unremarkable. No pancreatic ductal dilatation or surrounding inflammatory changes. Spleen: Normal in size without focal abnormality. Adrenals/Urinary Tract: Adrenal glands are within normal limits. The kidneys demonstrate cystic change bilaterally. Additionally some patchy irregular enhancement of the right kidney is noted suspicious for pyelonephritis. The bladder is decompressed by Foley catheter. Stomach/Bowel: There are changes consistent with a descending loop colostomy on the left consistent with the given clinical history. The large rectal mass is again identified measuring at least 6.3 cm in transverse diameter and extending for several cm along the course of the rectosigmoid. The previously seen fluid collections in the pelvis is not well visualized and likely represented some dilated colon proximal to the obstructing lesion. Normal decompressed colon is noted proximal to the rectosigmoid mass. No obstructive changes in the large or small bowel are noted. The stomach is within normal limits. There is however a small fluid attenuation area adjacent to the rectal mass best seen on image number 71 of series 3. This measures 3 x 2 cm and is suspicious for a small postoperative abscess. Vascular/Lymphatic: Aortic atherosclerosis. No enlarged abdominal or pelvic lymph nodes. Reproductive: Prostate is unremarkable. Other: No abdominal wall hernia or abnormality. No abdominopelvic ascites. Musculoskeletal: No acute or significant osseous findings. Review of the MIP images confirms the above findings. IMPRESSION: CTA of the  chest: No evidence of pulmonary emboli.  Persistent mass lesion arising from the left hilum along the mediastinum in into the upper lobe is again seen stable from prior CT examination. Again this likely represents metastatic disease with peripheral consolidation although the possibility of a primary lesion deserves consideration. The known left thyroid lesion is not well appreciated on today's exam due to scatter artifact and underlying enhancement of the thyroid. CT of the abdomen and pelvis: Persistent large rectosigmoid mass lesion consistent with the given clinical history of rectal carcinoma. The previously seen fluid collection adjacent to this mass likely represented a dilated loop of colon as no significant abscess was noted on the intraoperative evaluation and no persistent fluid collection is noted on today's exam. There is however a small 3.0 x 2.0 cm fluid collection adjacent to the rectal mass best seen on image number 71 of series 3 suspicious for a small postoperative abscess. Patchy enhancement pattern particularly within the right kidney suspicious for pyelonephritis. Metastatic disease within the posterior aspect of the right lobe of the liver stable from previous studies. New left mid abdominal loop colostomy. Electronically Signed   By: Inez Catalina M.D.   On: 10/15/2019 19:30   US Biopsy (liver)  Result Date: 09/30/2019 INDICATION: 78 year old with a recent perforated bowel and concern for colorectal cancer. History of prostate cancer. Patient has a large liver lesion and need tissue diagnosis. EXAM: ULTRASOUND-GUIDED LIVER LESION BIOPSY MEDICATIONS: None. ANESTHESIA/SEDATION: Moderate (conscious) sedation was employed during this procedure. A total of Versed 1.0 mg and Fentanyl 50 mcg was administered intravenously. Moderate Sedation Time: 21 minutes. The patient's level of consciousness and vital signs were monitored continuously by radiology nursing throughout the procedure under my direct supervision. FLUOROSCOPY TIME:  None  COMPLICATIONS: None immediate. PROCEDURE: Informed written consent was obtained from the patient after a thorough discussion of the procedural risks, benefits and alternatives. All questions were addressed. Maximal Sterile Barrier Technique was utilized including caps, mask, sterile gowns, sterile gloves, sterile drape, hand hygiene and skin antiseptic. A timeout was performed prior to the initiation of the procedure. Liver was evaluated with ultrasound. The hepatic lesion was identified. The right side of the abdomen was prepped with chlorhexidine and sterile field was created. Skin and soft tissues were anesthetized with 1% lidocaine. 54 gauge coaxial needle was directed into the liver lesion with real-time ultrasound guidance. Three core biopsies were obtained with an 18 gauge core device. Gel-Foam slurry was injected along the needle track following the biopsies. Bandage placed over the puncture site. FINDINGS: Trace perihepatic ascites near the hepatic dome. Not enough fluid for a paracentesis. Large hyperechoic liver mass in the right hepatic lobe. Needle position confirmed within the liver lesion. Adequate core biopsies obtained. No significant bleeding or hematoma following the core biopsies. IMPRESSION: Ultrasound-guided core biopsies of the hepatic mass. Electronically Signed   By: Markus Daft M.D.   On: 09/30/2019 13:16   Dg Chest Port 1 View  Result Date: 10/16/2019 CLINICAL DATA:  Central line insertion EXAM: PORTABLE CHEST 1 VIEW COMPARISON:  October 15, 2019 FINDINGS: Interval placement of left IJ central line with tip overlying the central SVC. No pneumothorax. Stable left perihilar density better evaluated on recent CT. There is mild elevation of the left hemidiaphragm. No pleural effusion. Stable cardiomediastinal contours. IMPRESSION: New left IJ central line tip overlying the SVC.  No pneumothorax. Electronically Signed   By: Macy Mis M.D.   On: 10/16/2019 12:48   Dg Chest Portable 1  View  Result Date: 10/15/2019 CLINICAL DATA:  Fever EXAM: PORTABLE CHEST 1 VIEW COMPARISON:  September 27, 2015 FINDINGS: There is soft tissue fullness in the left hilar region concerning for adenopathy. There is questionable associated infiltrate in this area. Lungs elsewhere are clear. Heart size is normal. Pulmonary vascularity appears normal. No appreciable bone lesions. IMPRESSION: Suspect left hilar adenopathy with questionable associated pneumonia. Given concern for neoplastic involvement in this area, contrast enhanced chest CT advised. Lungs elsewhere clear.  Heart size normal. Electronically Signed   By: Lowella Grip III M.D.   On: 10/15/2019 16:39   Dg Abd 2 Views  Result Date: 09/27/2019 CLINICAL DATA:  Follow-up bowel obstruction. EXAM: ABDOMEN - 2 VIEW COMPARISON:  Radiograph of September 25, 2019. FINDINGS: Nasogastric tube tip is seen in proximal stomach. Stable diffuse gaseous dilatation of large and small bowel is noted. Surgical drain is noted in the pelvis. Phleboliths are noted in the pelvis. No definite pneumoperitoneum is noted. IMPRESSION: Grossly stable large and small bowel dilatation is noted concerning for ileus or less likely obstruction. Electronically Signed   By: Marijo Conception M.D.   On: 09/27/2019 11:31     Subjective: Patient seen and examined.  No overnight events.  Remains fairly stable.  Denies any pain.  Denies any nausea vomiting, however his appetite is poor.  His blood pressures are usually low and he is asymptomatic.  He was able to get up with not getting any dizziness. He was looking forward to go home.   Discharge Exam: Vitals:   10/25/19 0257 10/25/19 0800  BP: 100/78 (!) 83/57  Pulse: 76 80  Resp: 20 17  Temp: (!) 97.4 F (36.3 C) 97.9 F (36.6 C)  SpO2: 100% 98%   Vitals:   10/24/19 2334 10/25/19 0251 10/25/19 0257 10/25/19 0800  BP:   100/78 (!) 83/57  Pulse:   76 80  Resp:   20 17  Temp: 98.5 F (36.9 C)  (!) 97.4 F (36.3 C) 97.9 F  (36.6 C)  TempSrc: Oral  Oral Oral  SpO2:   100% 98%  Weight:  57.7 kg    Height:        General: Pt is alert, awake, not in acute distress.  Chronically sick looking.  Not in any distress.  He is on room air. He is alert oriented x3, does not understand that he has stage IV cancer, however he does not want to talk about it. Cardiovascular: RRR, S1/S2 +, no rubs, no gallops Respiratory: CTA bilaterally, no wheezing, no rhonchi Abdominal: Soft, NT, ND, bowel sounds +, left lower quadrant colostomy bag filled with liquidy stool.  Midline surgical incision with minimal drain, no open incision.  Dry dressing.  Foley catheter with clear urine. Extremities: no edema, no cyanosis    The results of significant diagnostics from this hospitalization (including imaging, microbiology, ancillary and laboratory) are listed below for reference.     Microbiology: Recent Results (from the past 240 hour(s))  Urine culture     Status: Abnormal   Collection Time: 10/15/19  4:25 PM   Specimen: Urine, Catheterized  Result Value Ref Range Status   Specimen Description   Final    URINE, CATHETERIZED Performed at [facility], [address], [place], [place] [address]    Special Requests   Final    NONE Performed at [facility], [address], [place],  [address]    Culture (A)  Final    70,000 COLONIES/mL  PSEUDOMONAS AERUGINOSA 80,000 COLONIES/mL ENTEROCOCCUS FAECALIS    Report Status 10/18/2019 FINAL  Final   Organism ID, Bacteria PSEUDOMONAS AERUGINOSA (A)  Final   Organism ID, Bacteria ENTEROCOCCUS FAECALIS (A)  Final      Susceptibility   Enterococcus faecalis - MIC*    AMPICILLIN <=2 SENSITIVE Sensitive     LEVOFLOXACIN 0.5 SENSITIVE Sensitive     NITROFURANTOIN <=16 SENSITIVE Sensitive     VANCOMYCIN 1 SENSITIVE Sensitive     * 80,000 COLONIES/mL ENTEROCOCCUS FAECALIS   Pseudomonas aeruginosa - MIC*    CEFTAZIDIME 4 SENSITIVE Sensitive     CIPROFLOXACIN  <=0.25 SENSITIVE Sensitive     GENTAMICIN 8 INTERMEDIATE Intermediate     IMIPENEM 2 SENSITIVE Sensitive     PIP/TAZO 16 SENSITIVE Sensitive     CEFEPIME 8 SENSITIVE Sensitive     * 70,000 COLONIES/mL PSEUDOMONAS AERUGINOSA  Blood culture (routine x 2)     Status: Abnormal   Collection Time: 10/15/19  4:25 PM   Specimen: BLOOD  Result Value Ref Range Status   Specimen Description   Final    BLOOD LEFT ANTECUBITAL Performed at [facility], [address] [place], [place] [address]    Special Requests   Final    BOTTLES DRAWN AEROBIC AND ANAEROBIC Blood Culture results may not be optimal due to an inadequate volume of blood received in culture bottles Performed at [facility], [address] [place], [place] [address]    Culture  Setup Time   Final    IN BOTH AEROBIC AND ANAEROBIC BOTTLES GRAM POSITIVE COCCI CRITICAL RESULT CALLED TO, READ BACK BY AND VERIFIED WITH: SCOTT HALL AT [phone] ON 10/16/2019 JJB Performed at [facility], [address], [place], [place] [address]    Culture (A)  Final    ENTEROCOCCUS FAECALIS STAPHYLOCOCCUS SPECIES (COAGULASE NEGATIVE) THE SIGNIFICANCE OF ISOLATING THIS ORGANISM FROM A SINGLE SET OF BLOOD CULTURES WHEN MULTIPLE SETS ARE DRAWN IS UNCERTAIN. PLEASE NOTIFY THE MICROBIOLOGY DEPARTMENT WITHIN ONE WEEK IF SPECIATION AND SENSITIVITIES ARE REQUIRED. CRITICAL RESULT CALLED TO, READ BACK BY AND VERIFIED WITH: PHARMD Cleotis Lema AUMEISTER [id number] FCP Performed at [facility], [place] [address], [place], [place] [address]    Report Status 10/20/2019 FINAL  Final   Organism ID, Bacteria ENTEROCOCCUS FAECALIS  Final      Susceptibility   Enterococcus faecalis - MIC*    AMPICILLIN <=2 SENSITIVE Sensitive     VANCOMYCIN 1 SENSITIVE Sensitive     GENTAMICIN SYNERGY SENSITIVE Sensitive     * ENTEROCOCCUS FAECALIS  Blood culture (routine x 2)     Status: None   Collection Time: 10/15/19  4:29 PM   Specimen: BLOOD   Result Value Ref Range Status   Specimen Description BLOOD BLOOD RIGHT FOREARM  Final   Special Requests   Final    BOTTLES DRAWN AEROBIC AND ANAEROBIC Blood Culture results may not be optimal due to an inadequate volume of blood received in culture bottles   Culture   Final    NO GROWTH 5 DAYS Performed at [facility], [address] [place], [place] [address]    Report Status 10/20/2019 FINAL  Final  SARS Coronavirus 2 by RT PCR (hospital order, performed in [facility] hospital lab) Nasopharyngeal Nasopharyngeal Swab     Status: None   Collection Time: 10/15/19  4:29 PM   Specimen: Nasopharyngeal Swab  Result Value Ref Range Status   SARS Coronavirus 2 NEGATIVE NEGATIVE  Final    Comment: (NOTE) If result is NEGATIVE SARS-CoV-2 target nucleic acids are NOT DETECTED. The SARS-CoV-2 RNA is generally detectable in upper and lower  respiratory specimens during the acute phase of infection. The lowest  concentration of SARS-CoV-2 viral copies this assay can detect is 250  copies / mL. A negative result does not preclude SARS-CoV-2 infection  and should not be used as the sole basis for treatment or other  patient management decisions.  A negative result may occur with  improper specimen collection / handling, submission of specimen other  than nasopharyngeal swab, presence of viral mutation(s) within the  areas targeted by this assay, and inadequate number of viral copies  (<250 copies / mL). A negative result must be combined with clinical  observations, patient history, and epidemiological information. If result is POSITIVE SARS-CoV-2 target nucleic acids are DETECTED. The SARS-CoV-2 RNA is generally detectable in upper and lower  respiratory specimens dur ing the acute phase of infection.  Positive  results are indicative of active infection with SARS-CoV-2.  Clinical  correlation with patient history and other diagnostic information is  necessary to determine patient  infection status.  Positive results do  not rule out bacterial infection or co-infection with other viruses. If result is PRESUMPTIVE POSTIVE SARS-CoV-2 nucleic acids MAY BE PRESENT.   A presumptive positive result was obtained on the submitted specimen  and confirmed on repeat testing.  While 2019 novel coronavirus  (SARS-CoV-2) nucleic acids may be present in the submitted sample  additional confirmatory testing may be necessary for epidemiological  and / or clinical management purposes  to differentiate between  SARS-CoV-2 and other Sarbecovirus currently known to infect humans.  If clinically indicated additional testing with an alternate test  methodology [phone]) is advised. The SARS-CoV-2 RNA is generally  detectable in upper and lower respiratory sp ecimens during the acute  phase of infection. The expected result is Negative. Fact Sheet for Patients:  StrictlyIdeas.no Fact Sheet for Healthcare Providers: BankingDealers.co.za This test is not yet approved or cleared by the [place] FDA and has been authorized for detection and/or diagnosis of SARS-CoV-2 by FDA under an Emergency Use Authorization (EUA).  This EUA will remain in effect (meaning this test can be used) for the duration of the COVID-19 declaration under Section 564(b)(1) of the Act, 21 U.S.C. section 360bbb-3(b)(1), unless the authorization is terminated or revoked sooner. Performed at [facility], [address] [place], [place] [address]   GI pathogen panel by PCR, stool     Status: None   Collection Time: 10/15/19 10:00 PM   Specimen: Stool  Result Value Ref Range Status   Plesiomonas shigelloides NOT DETECTED NOT DETECTED Final   Yersinia enterocolitica NOT DETECTED NOT DETECTED Final   Vibrio NOT DETECTED NOT DETECTED Final   Enteropathogenic E coli NOT DETECTED NOT DETECTED Final   E coli (ETEC) LT/ST NOT DETECTED NOT DETECTED Final   E  coli [id number] by PCR Not applicable NOT DETECTED Final   Cryptosporidium by PCR NOT DETECTED NOT DETECTED Final   Entamoeba histolytica NOT DETECTED NOT DETECTED Final   Adenovirus F 40/41 NOT DETECTED NOT DETECTED Final   Norovirus GI/GII NOT DETECTED NOT DETECTED Final   Sapovirus NOT DETECTED NOT DETECTED Final    Comment: (NOTE) Performed At: [facility] [place], [place] [id number] Rush Farmer MD [id number]    Vibrio cholerae NOT DETECTED NOT DETECTED Final   Campylobacter by PCR NOT DETECTED NOT DETECTED  Final   Salmonella by PCR NOT DETECTED NOT DETECTED Final   E coli (STEC) NOT DETECTED NOT DETECTED Final   Enteroaggregative E coli NOT DETECTED NOT DETECTED Final   Shigella by PCR NOT DETECTED NOT DETECTED Final   Cyclospora cayetanensis NOT DETECTED NOT DETECTED Final   Astrovirus NOT DETECTED NOT DETECTED Final   G lamblia by PCR NOT DETECTED NOT DETECTED Final   Rotavirus A by PCR NOT DETECTED NOT DETECTED Final  C Difficile Quick Screen w PCR reflex     Status: Abnormal   Collection Time: 10/15/19 10:00 PM   Specimen: Stool  Result Value Ref Range Status   C Diff antigen POSITIVE (A) NEGATIVE Final   C Diff toxin NEGATIVE NEGATIVE Final   C Diff interpretation Results are indeterminate. See PCR results.  Final    Comment: Performed at [facility], [address] [place], [place] [address]  C. Diff by PCR, Reflexed     Status: None   Collection Time: 10/15/19 10:00 PM  Result Value Ref Range Status   Toxigenic C. Difficile by PCR NEGATIVE NEGATIVE Final    Comment: Patient is colonized with non toxigenic C. difficile. May not need treatment unless significant symptoms are present. Performed at [facility], [address] [place], [place] [address]   MRSA PCR Screening     Status: None   Collection Time: 10/16/19  3:25 AM   Specimen: Nasal Mucosa; Nasopharyngeal  Result Value Ref Range Status   MRSA by PCR NEGATIVE  NEGATIVE Final    Comment:        The GeneXpert MRSA Assay (FDA approved for NASAL specimens only), is one component of a comprehensive MRSA colonization surveillance program. It is not intended to diagnose MRSA infection nor to guide or monitor treatment for MRSA infections. Performed at [facility], [place] [address], [place], [place] [address]   Culture, blood (routine x 2)     Status: None   Collection Time: 10/16/19  5:55 AM   Specimen: BLOOD RIGHT ARM  Result Value Ref Range Status   Specimen Description BLOOD RIGHT ARM  Final   Special Requests   Final    BOTTLES DRAWN AEROBIC AND ANAEROBIC Blood Culture results may not be optimal due to an inadequate volume of blood received in culture bottles   Culture   Final    NO GROWTH 5 DAYS Performed at [facility], [place] [address] [place], [place] [address]    Report Status 10/21/2019 FINAL  Final  Culture, blood (routine x 2)     Status: None   Collection Time: 10/16/19  5:57 AM   Specimen: BLOOD LEFT ARM  Result Value Ref Range Status   Specimen Description BLOOD LEFT ARM  Final   Special Requests   Final    BOTTLES DRAWN AEROBIC AND ANAEROBIC Blood Culture results may not be optimal due to an inadequate volume of blood received in culture bottles   Culture   Final    NO GROWTH 5 DAYS Performed at [facility], [place] [address] [place], [place] [address]    Report Status 10/21/2019 FINAL  Final  Culture, blood (Routine X 2) w Reflex to ID Panel     Status: None   Collection Time: 10/18/19 10:39 AM   Specimen: BLOOD  Result Value Ref Range Status   Specimen Description BLOOD RIGHT ANTECUBITAL  Final   Special Requests   Final    BOTTLES DRAWN  AEROBIC AND ANAEROBIC Blood Culture adequate volume   Culture   Final    NO GROWTH 5 DAYS Performed at [facility], [place] [address], [place], [place] [address]    Report Status 10/23/2019 FINAL  Final  Culture, blood (Routine X 2) w Reflex to ID Panel      Status: None   Collection Time: 10/18/19 10:39 AM   Specimen: BLOOD  Result Value Ref Range Status   Specimen Description BLOOD BLOOD RIGHT HAND  Final   Special Requests   Final    BOTTLES DRAWN AEROBIC AND ANAEROBIC Blood Culture adequate volume   Culture   Final    NO GROWTH 5 DAYS Performed at [facility], [place] [address], [place], [place] [address]    Report Status 10/23/2019 FINAL  Final     Labs: BNP (last 3 results) No results for input(s): BNP in the last 8760 hours. Basic Metabolic Panel: Recent Labs  Lab 10/19/19 0448 10/20/19 0456 10/21/19 0221 10/22/19 0358  NA 141 141 141 140  K 2.9* 3.6 4.0 3.8  CL 108 111 112* 109  CO2 25 24 24 25   GLUCOSE 93 88 97 88  BUN 7* 6* 6* <5*  CREATININE 0.80 0.84 0.85 0.90  CALCIUM 7.4* 7.4* 7.5* 7.5*  MG 1.5* 1.8 1.6* 1.7  PHOS  --   --   --  2.2*   Liver Function Tests: No results for input(s): AST, ALT, ALKPHOS, BILITOT, PROT, ALBUMIN in the last 168 hours. No results for input(s): LIPASE, AMYLASE in the last 168 hours. No results for input(s): AMMONIA in the last 168 hours. CBC: Recent Labs  Lab 10/19/19 0448 10/20/19 0456 10/21/19 0221 10/22/19 0358 10/23/19 0500  WBC 10.1 10.0 8.1 6.0 5.4  NEUTROABS  --   --   --  4.5 4.1  HGB 8.2* 7.9* 8.3* 7.5* 8.3*  HCT 25.7* 24.5* 25.3* 24.1* 26.5*  MCV 95.5 93.9 94.4 97.6 97.8  PLT 248 202 261 273 320   Cardiac Enzymes: No results for input(s): CKTOTAL, CKMB, CKMBINDEX, TROPONINI in the last 168 hours. BNP: Invalid input(s): POCBNP CBG: Recent Labs  Lab 10/18/19 1557  GLUCAP 154*   D-Dimer No results for input(s): DDIMER in the last 72 hours. Hgb A1c No results for input(s): HGBA1C in the last 72 hours. Lipid Profile No results for input(s): CHOL, HDL, LDLCALC, TRIG, CHOLHDL, LDLDIRECT in the last 72 hours. Thyroid function studies No results for input(s): TSH, T4TOTAL, T3FREE, THYROIDAB in the last 72 hours.  Invalid input(s): FREET3 Anemia work  up No results for input(s): VITAMINB12, FOLATE, FERRITIN, TIBC, IRON, RETICCTPCT in the last 72 hours. Urinalysis    Component Value Date/Time   COLORURINE YELLOW (A) 10/15/2019 1625   APPEARANCEUR HAZY (A) 10/15/2019 1625   LABSPEC 1.013 10/15/2019 1625   PHURINE 5.0 10/15/2019 1625   GLUCOSEU NEGATIVE 10/15/2019 1625   HGBUR LARGE (A) 10/15/2019 1625   BILIRUBINUR NEGATIVE 10/15/2019 1625   KETONESUR NEGATIVE 10/15/2019 1625   PROTEINUR 30 (A) 10/15/2019 1625   NITRITE NEGATIVE 10/15/2019 1625   LEUKOCYTESUR TRACE (A) 10/15/2019 1625   Sepsis Labs Invalid input(s): PROCALCITONIN,  WBC,  LACTICIDVEN Microbiology Recent Results (from the past 240 hour(s))  Urine culture     Status: Abnormal   Collection Time: 10/15/19  4:25 PM   Specimen: Urine, Catheterized  Result Value Ref Range Status   Specimen Description   Final    URINE, CATHETERIZED Performed at [facility], [address] [place], [place] [address]    Special Requests   Final    NONE Performed at [facility], [address] [place], [place] [address]    Culture (A)  Final    70,000 COLONIES/mL PSEUDOMONAS AERUGINOSA 80,000 COLONIES/mL ENTEROCOCCUS FAECALIS    Report Status 10/18/2019 FINAL  Final   Organism ID, Bacteria PSEUDOMONAS AERUGINOSA (A)  Final   Organism ID, Bacteria ENTEROCOCCUS FAECALIS (A)  Final      Susceptibility   Enterococcus faecalis - MIC*    AMPICILLIN <=2 SENSITIVE Sensitive     LEVOFLOXACIN 0.5 SENSITIVE Sensitive     NITROFURANTOIN <=16 SENSITIVE Sensitive     VANCOMYCIN 1 SENSITIVE Sensitive     * 80,000 COLONIES/mL ENTEROCOCCUS FAECALIS   Pseudomonas aeruginosa - MIC*    CEFTAZIDIME 4 SENSITIVE Sensitive     CIPROFLOXACIN <=0.25 SENSITIVE Sensitive     GENTAMICIN 8 INTERMEDIATE Intermediate     IMIPENEM 2 SENSITIVE Sensitive     PIP/TAZO 16 SENSITIVE Sensitive     CEFEPIME 8 SENSITIVE Sensitive     * 70,000 COLONIES/mL PSEUDOMONAS AERUGINOSA  Blood  culture (routine x 2)     Status: Abnormal   Collection Time: 10/15/19  4:25 PM   Specimen: BLOOD  Result Value Ref Range Status   Specimen Description   Final    BLOOD LEFT ANTECUBITAL Performed at [facility], [address] [place], [place] [address]    Special Requests   Final    BOTTLES DRAWN AEROBIC AND ANAEROBIC Blood Culture results may not be optimal due to an inadequate volume of blood received in culture bottles Performed at [facility], [address] [place], [place] [address]    Culture  Setup Time   Final    IN BOTH AEROBIC AND ANAEROBIC BOTTLES GRAM POSITIVE COCCI CRITICAL RESULT CALLED TO, READ BACK BY AND VERIFIED WITH: SCOTT HALL AT [phone] ON 10/16/2019 JJB Performed at [facility], [address], [place], [place] [address]    Culture (A)  Final    ENTEROCOCCUS FAECALIS STAPHYLOCOCCUS SPECIES (COAGULASE NEGATIVE) THE SIGNIFICANCE OF ISOLATING THIS ORGANISM FROM A SINGLE SET OF BLOOD CULTURES WHEN MULTIPLE SETS ARE DRAWN IS UNCERTAIN. PLEASE NOTIFY THE MICROBIOLOGY DEPARTMENT WITHIN ONE WEEK IF SPECIATION AND SENSITIVITIES ARE REQUIRED. CRITICAL RESULT CALLED TO, READ BACK BY AND VERIFIED WITH: PHARMD Cleotis Lema AUMEISTER [id number] FCP Performed at [facility], [place] [address] [place], [place] [address]    Report Status 10/20/2019 FINAL  Final   Organism ID, Bacteria ENTEROCOCCUS FAECALIS  Final      Susceptibility   Enterococcus faecalis - MIC*    AMPICILLIN <=2 SENSITIVE Sensitive     VANCOMYCIN 1 SENSITIVE Sensitive     GENTAMICIN SYNERGY SENSITIVE Sensitive     * ENTEROCOCCUS FAECALIS  Blood culture (routine x 2)     Status: None   Collection Time: 10/15/19  4:29 PM   Specimen: BLOOD  Result Value Ref Range Status   Specimen Description BLOOD BLOOD RIGHT FOREARM  Final   Special Requests   Final    BOTTLES DRAWN AEROBIC AND ANAEROBIC Blood Culture results may not be optimal due to an inadequate volume of blood  received in culture bottles   Culture   Final    NO GROWTH 5 DAYS Performed at [facility], [address] [place], [place] [address]    Report Status 10/20/2019 FINAL  Final  SARS Coronavirus 2 by RT PCR (hospital  order, performed in [facility] hospital lab) Nasopharyngeal Nasopharyngeal Swab     Status: None   Collection Time: 10/15/19  4:29 PM   Specimen: Nasopharyngeal Swab  Result Value Ref Range Status   SARS Coronavirus 2 NEGATIVE NEGATIVE Final    Comment: (NOTE) If result is NEGATIVE SARS-CoV-2 target nucleic acids are NOT DETECTED. The SARS-CoV-2 RNA is generally detectable in upper and lower  respiratory specimens during the acute phase of infection. The lowest  concentration of SARS-CoV-2 viral copies this assay can detect is 250  copies / mL. A negative result does not preclude SARS-CoV-2 infection  and should not be used as the sole basis for treatment or other  patient management decisions.  A negative result may occur with  improper specimen collection / handling, submission of specimen other  than nasopharyngeal swab, presence of viral mutation(s) within the  areas targeted by this assay, and inadequate number of viral copies  (<250 copies / mL). A negative result must be combined with clinical  observations, patient history, and epidemiological information. If result is POSITIVE SARS-CoV-2 target nucleic acids are DETECTED. The SARS-CoV-2 RNA is generally detectable in upper and lower  respiratory specimens dur ing the acute phase of infection.  Positive  results are indicative of active infection with SARS-CoV-2.  Clinical  correlation with patient history and other diagnostic information is  necessary to determine patient infection status.  Positive results do  not rule out bacterial infection or co-infection with other viruses. If result is PRESUMPTIVE POSTIVE SARS-CoV-2 nucleic acids MAY BE PRESENT.   A presumptive positive result was obtained on  the submitted specimen  and confirmed on repeat testing.  While 2019 novel coronavirus  (SARS-CoV-2) nucleic acids may be present in the submitted sample  additional confirmatory testing may be necessary for epidemiological  and / or clinical management purposes  to differentiate between  SARS-CoV-2 and other Sarbecovirus currently known to infect humans.  If clinically indicated additional testing with an alternate test  methodology [phone]) is advised. The SARS-CoV-2 RNA is generally  detectable in upper and lower respiratory sp ecimens during the acute  phase of infection. The expected result is Negative. Fact Sheet for Patients:  StrictlyIdeas.no Fact Sheet for Healthcare Providers: BankingDealers.co.za This test is not yet approved or cleared by the [place] FDA and has been authorized for detection and/or diagnosis of SARS-CoV-2 by FDA under an Emergency Use Authorization (EUA).  This EUA will remain in effect (meaning this test can be used) for the duration of the COVID-19 declaration under Section 564(b)(1) of the Act, 21 U.S.C. section 360bbb-3(b)(1), unless the authorization is terminated or revoked sooner. Performed at [facility], [address] [place], [place] [address]   GI pathogen panel by PCR, stool     Status: None   Collection Time: 10/15/19 10:00 PM   Specimen: Stool  Result Value Ref Range Status   Plesiomonas shigelloides NOT DETECTED NOT DETECTED Final   Yersinia enterocolitica NOT DETECTED NOT DETECTED Final   Vibrio NOT DETECTED NOT DETECTED Final   Enteropathogenic E coli NOT DETECTED NOT DETECTED Final   E coli (ETEC) LT/ST NOT DETECTED NOT DETECTED Final   E coli [id number] by PCR Not applicable NOT DETECTED Final   Cryptosporidium by PCR NOT DETECTED NOT DETECTED Final   Entamoeba histolytica NOT DETECTED NOT DETECTED Final   Adenovirus F 40/41 NOT DETECTED NOT DETECTED Final   Norovirus  GI/GII NOT DETECTED NOT DETECTED Final   Sapovirus NOT DETECTED  NOT DETECTED Final    Comment: (NOTE) Performed At: [facility] [place], [place] [id number] Rush Farmer MD [id number]    Vibrio cholerae NOT DETECTED NOT DETECTED Final   Campylobacter by PCR NOT DETECTED NOT DETECTED Final   Salmonella by PCR NOT DETECTED NOT DETECTED Final   E coli (STEC) NOT DETECTED NOT DETECTED Final   Enteroaggregative E coli NOT DETECTED NOT DETECTED Final   Shigella by PCR NOT DETECTED NOT DETECTED Final   Cyclospora cayetanensis NOT DETECTED NOT DETECTED Final   Astrovirus NOT DETECTED NOT DETECTED Final   G lamblia by PCR NOT DETECTED NOT DETECTED Final   Rotavirus A by PCR NOT DETECTED NOT DETECTED Final  C Difficile Quick Screen w PCR reflex     Status: Abnormal   Collection Time: 10/15/19 10:00 PM   Specimen: Stool  Result Value Ref Range Status   C Diff antigen POSITIVE (A) NEGATIVE Final   C Diff toxin NEGATIVE NEGATIVE Final   C Diff interpretation Results are indeterminate. See PCR results.  Final    Comment: Performed at [facility], [address] [place], [place] [address]  C. Diff by PCR, Reflexed     Status: None   Collection Time: 10/15/19 10:00 PM  Result Value Ref Range Status   Toxigenic C. Difficile by PCR NEGATIVE NEGATIVE Final    Comment: Patient is colonized with non toxigenic C. difficile. May not need treatment unless significant symptoms are present. Performed at [facility], [address] [place], [place] [address]   MRSA PCR Screening     Status: None   Collection Time: 10/16/19  3:25 AM   Specimen: Nasal Mucosa; Nasopharyngeal  Result Value Ref Range Status   MRSA by PCR NEGATIVE NEGATIVE Final    Comment:        The GeneXpert MRSA Assay (FDA approved for NASAL specimens only), is one component of a comprehensive MRSA colonization surveillance program. It is not intended to diagnose MRSA infection nor  to guide or monitor treatment for MRSA infections. Performed at [facility], [place] [address] [place], [place] [address]   Culture, blood (routine x 2)     Status: None   Collection Time: 10/16/19  5:55 AM   Specimen: BLOOD RIGHT ARM  Result Value Ref Range Status   Specimen Description BLOOD RIGHT ARM  Final   Special Requests   Final    BOTTLES DRAWN AEROBIC AND ANAEROBIC Blood Culture results may not be optimal due to an inadequate volume of blood received in culture bottles   Culture   Final    NO GROWTH 5 DAYS Performed at [facility], [place] [address], [place], [place] [address]    Report Status 10/21/2019 FINAL  Final  Culture, blood (routine x 2)     Status: None   Collection Time: 10/16/19  5:57 AM   Specimen: BLOOD LEFT ARM  Result Value Ref Range Status   Specimen Description BLOOD LEFT ARM  Final   Special Requests   Final    BOTTLES DRAWN AEROBIC AND ANAEROBIC Blood Culture results may not be optimal due to an inadequate volume of blood received in culture bottles   Culture   Final    NO GROWTH 5 DAYS Performed at [facility], [place] [address] [place], [place] [address]    Report Status 10/21/2019 FINAL  Final  Culture, blood (Routine X 2) w Reflex to ID Panel  Status: None   Collection Time: 10/18/19 10:39 AM   Specimen: BLOOD  Result Value Ref Range Status   Specimen Description BLOOD RIGHT ANTECUBITAL  Final   Special Requests   Final    BOTTLES DRAWN AEROBIC AND ANAEROBIC Blood Culture adequate volume   Culture   Final    NO GROWTH 5 DAYS Performed at [facility], [address] [place], [place] [address]    Report Status 10/23/2019 FINAL  Final  Culture, blood (Routine X 2) w Reflex to ID Panel     Status: None   Collection Time: 10/18/19 10:39 AM   Specimen: BLOOD  Result Value Ref Range Status   Specimen Description BLOOD BLOOD RIGHT HAND  Final   Special Requests   Final    BOTTLES DRAWN AEROBIC AND ANAEROBIC Blood  Culture adequate volume   Culture   Final    NO GROWTH 5 DAYS Performed at [facility], [place] [address] [place], [place] [address]    Report Status 10/23/2019 FINAL  Final     Time coordinating discharge:  45 minutes  SIGNED:   Barb Merino, MD  [facility] 10/25/2019, 10:01 AM

## 2019-10-25 NOTE — Plan of Care (Signed)

## 2019-10-28 ENCOUNTER — Encounter (HOSPITAL_COMMUNITY): Payer: Self-pay | Admitting: Hematology

## 2019-10-31 ENCOUNTER — Ambulatory Visit: Payer: Medicare Other | Admitting: Hematology

## 2019-10-31 ENCOUNTER — Other Ambulatory Visit: Payer: Medicare Other

## 2020-03-19 DEATH — deceased

## 2020-04-19 IMAGING — DX DG CHEST 1V PORT
1 series · 1 of 1 positions shown · non-contrast
Comparison: September 27, 2015

CLINICAL DATA: Fever

EXAM:
PORTABLE CHEST 1 VIEW

[chest ap]
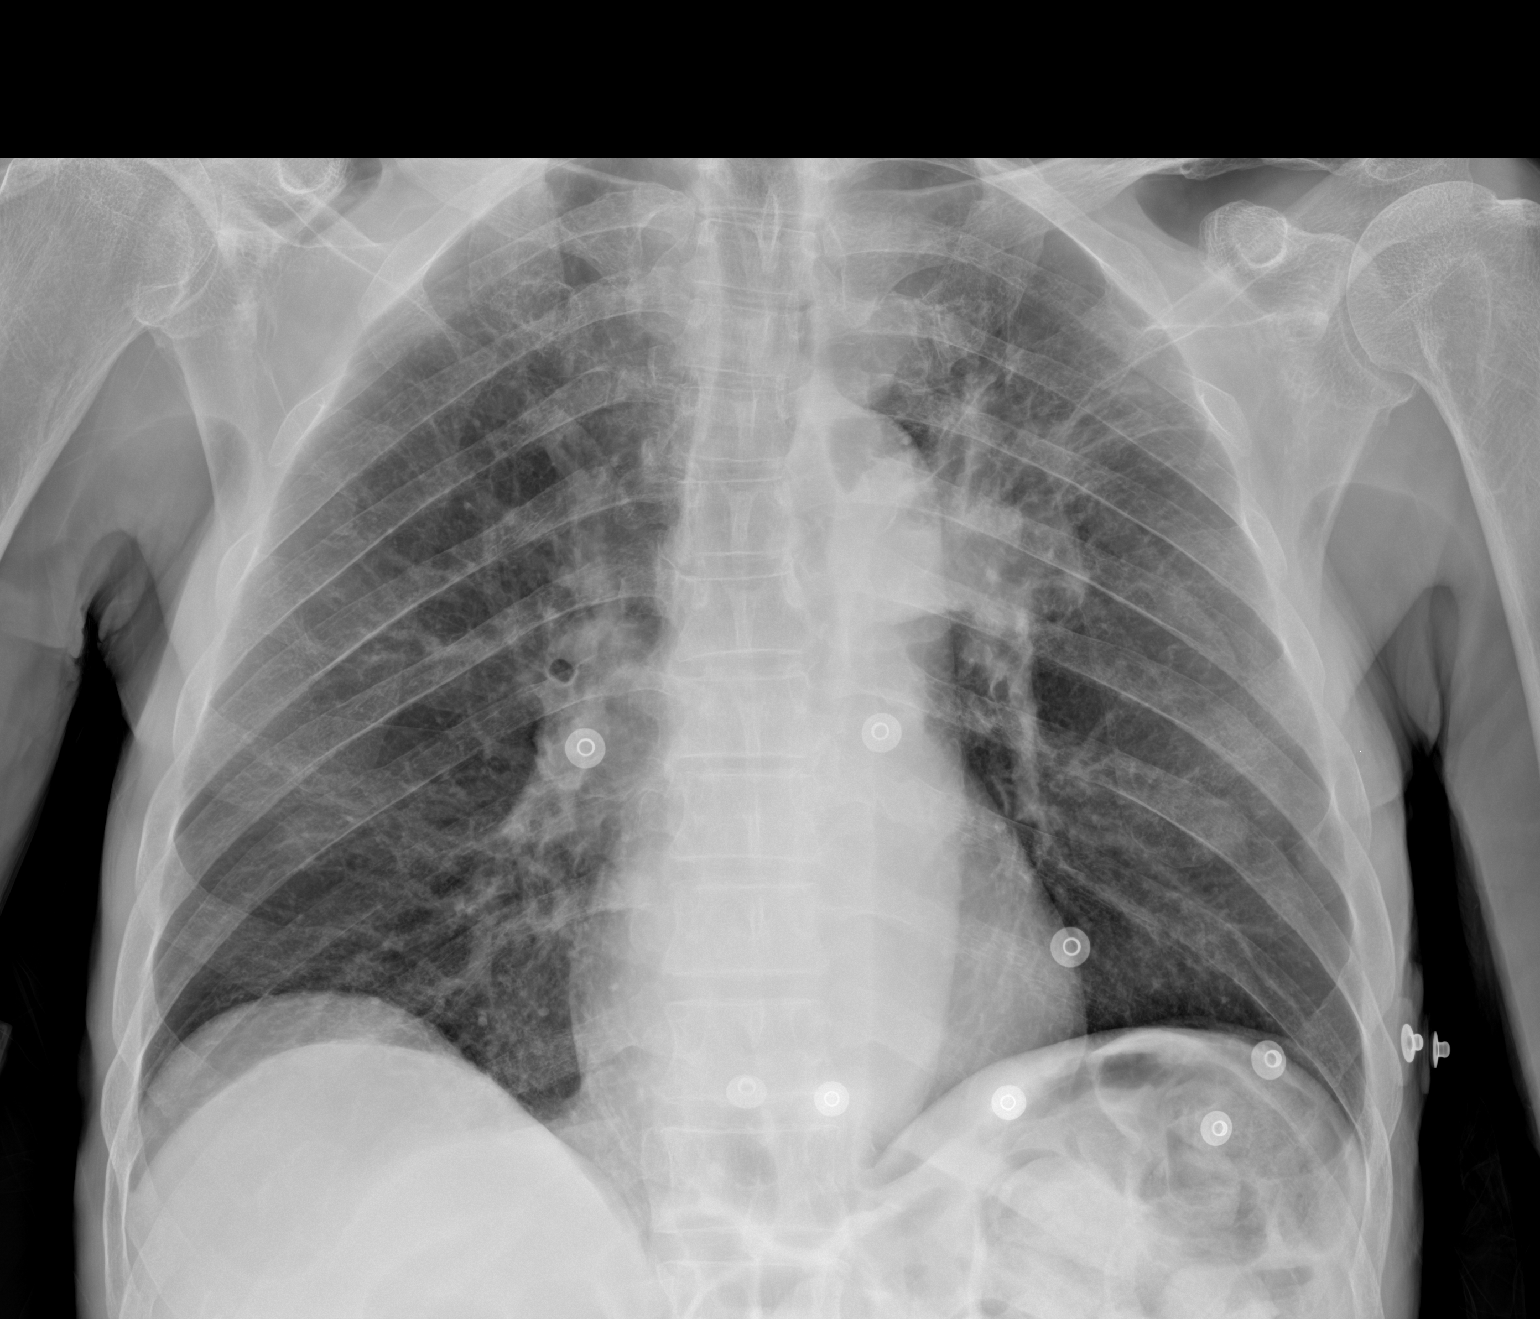

[1 of 1 positions shown; findings below may reference images not displayed]

FINDINGS: There is soft tissue fullness in the left hilar region concerning
for adenopathy. There is questionable associated infiltrate in this
area. Lungs elsewhere are clear. Heart size is normal. Pulmonary
vascularity appears normal. No appreciable bone lesions.
IMPRESSION: Suspect left hilar adenopathy with questionable associated
pneumonia. Given concern for neoplastic involvement in this area,
contrast enhanced chest CT advised.

Lungs elsewhere clear.  Heart size normal.

## 2020-04-19 IMAGING — CT CT ANGIO CHEST
2 of 7 series · 16 of 36 positions shown · IV contrast (APPLIED)
Comparison: 09/22/2019, 09/20/2019

CLINICAL DATA: Pain and fevers

EXAM:
CT ANGIOGRAPHY CHEST
CT ABDOMEN AND PELVIS WITH CONTRAST
TECHNIQUE: Multidetector CT imaging of the chest was performed using the
standard protocol during bolus administration of intravenous
contrast. Multiplanar CT image reconstructions and MIPs were
obtained to evaluate the vascular anatomy. Multidetector CT imaging
of the abdomen and pelvis was performed using the standard protocol
during bolus administration of intravenous contrast.
CONTRAST:  75 mL Omnipaque 350

[Series 4: thins · axial · 0.83mm/px · z∈[-541,-281]mm · 15 of 298 slices shown]
[im 19/298  lung]
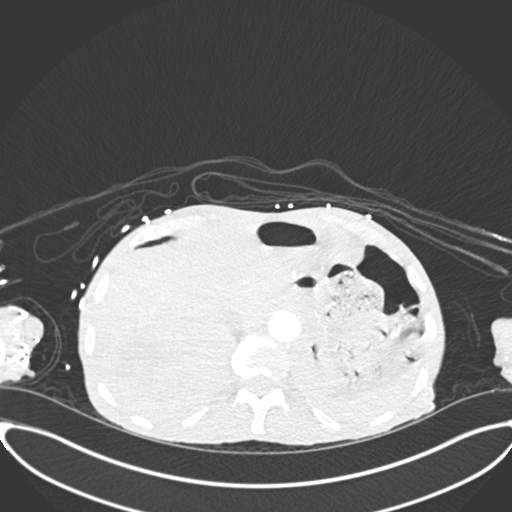
[im 38/298  mediastinal]
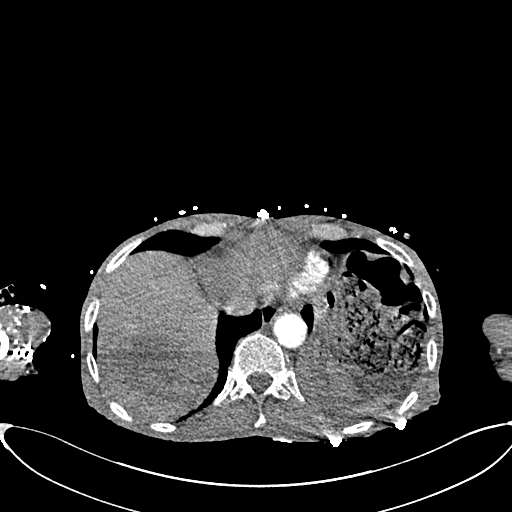
[im 56/298  lung]
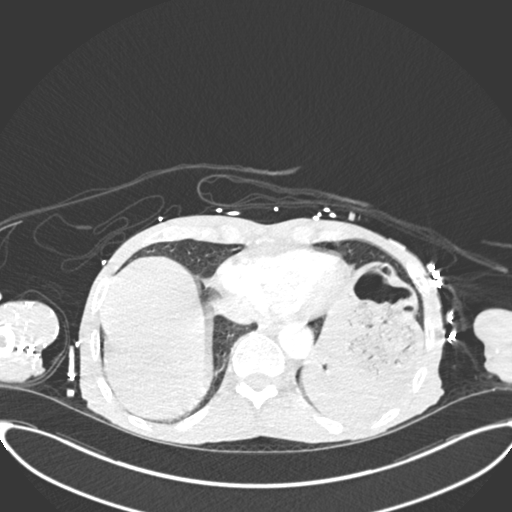
[im 75/298  mediastinal]
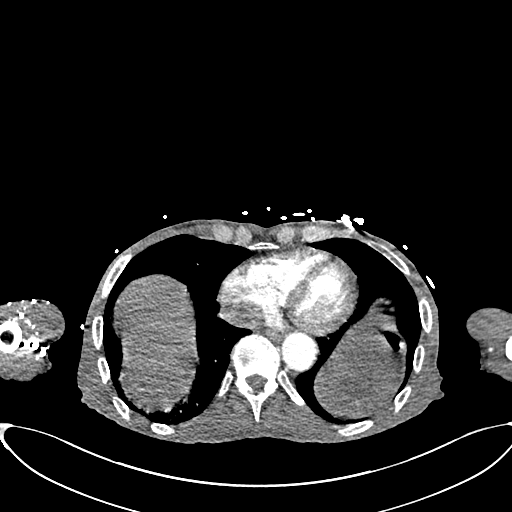
[im 93/298  lung]
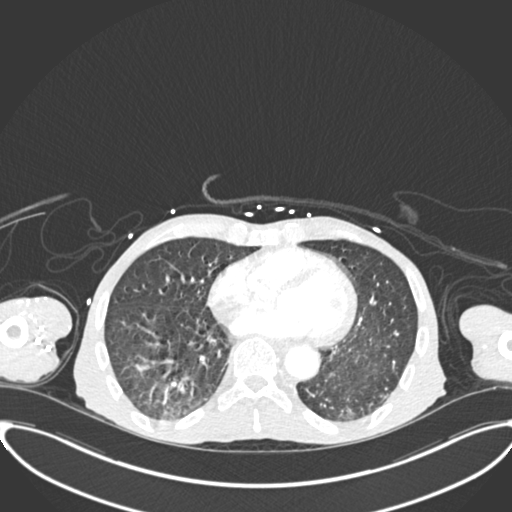
[im 112/298  mediastinal]
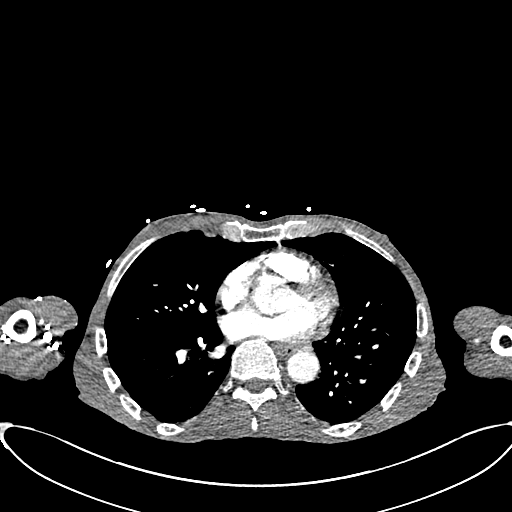
[im 130/298  lung]
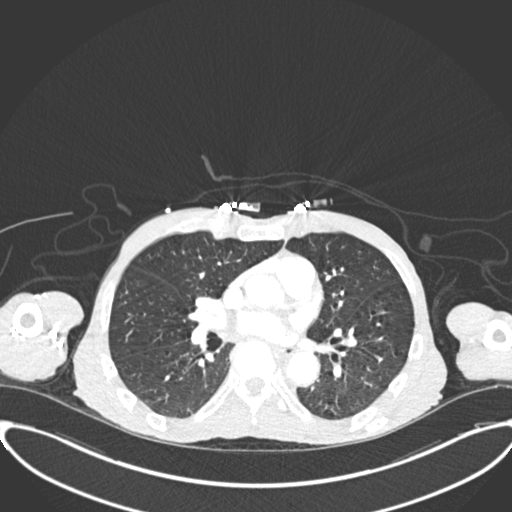
[im 149/298  mediastinal]
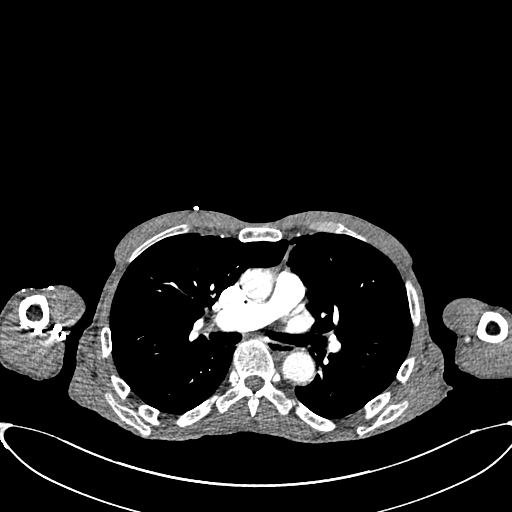
[im 168/298  lung]
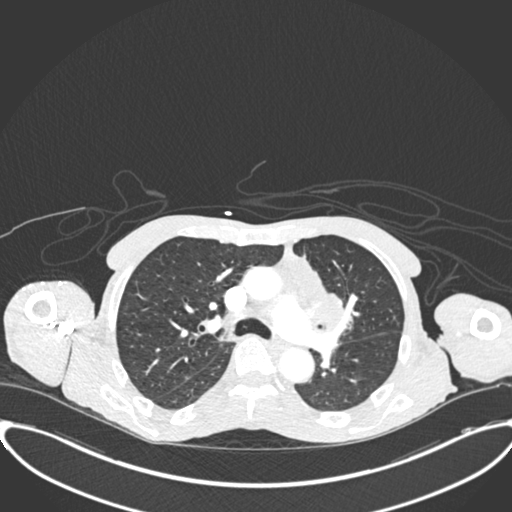
[im 186/298  mediastinal]
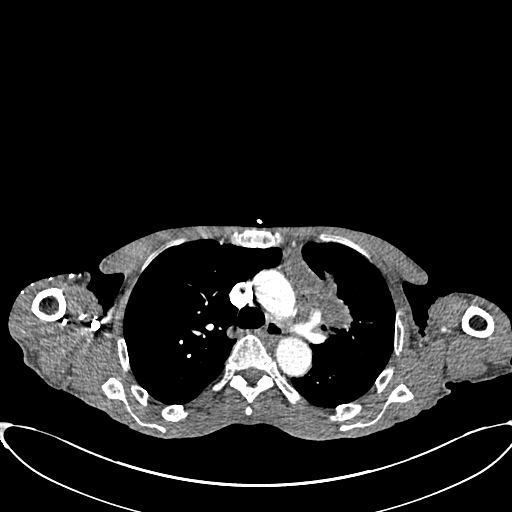
[im 205/298  lung]
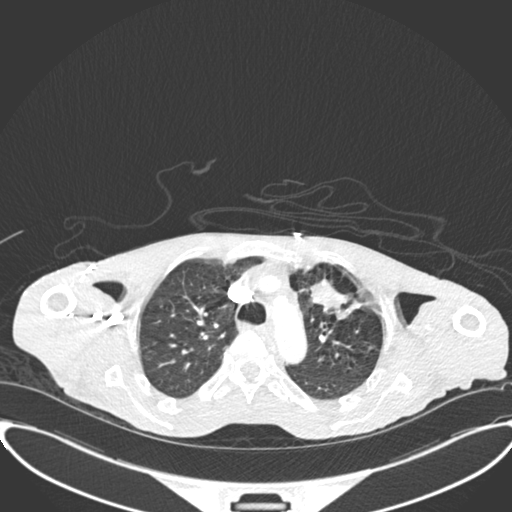
[im 223/298  mediastinal]
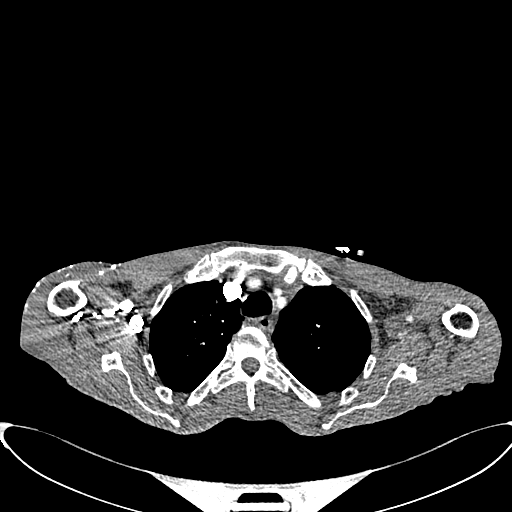
[im 242/298  lung]
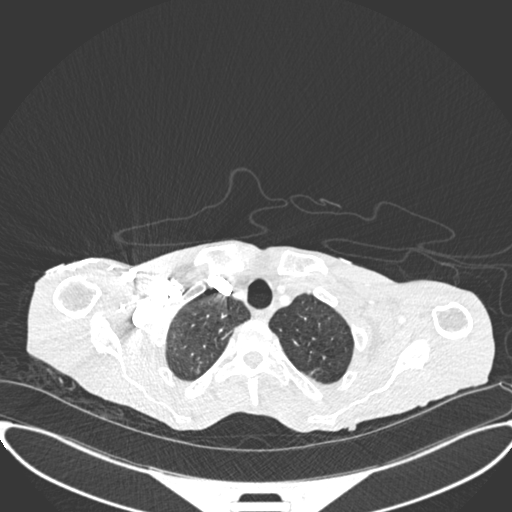
[im 260/298  mediastinal]
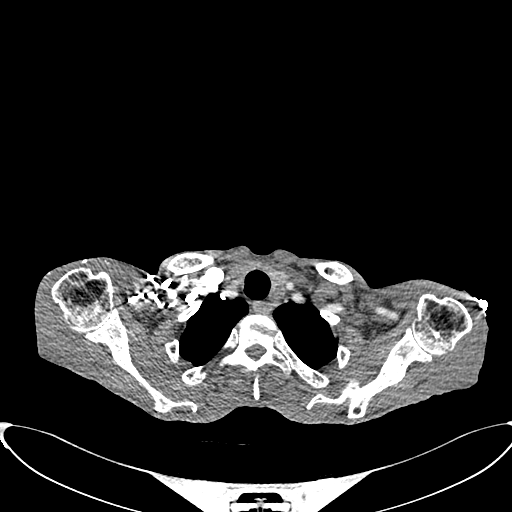
[im 279/298  lung]
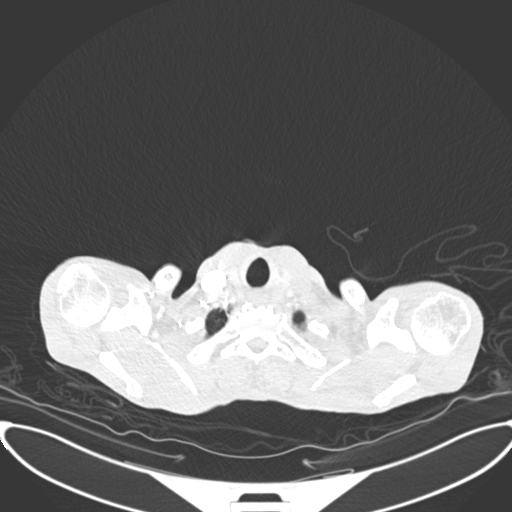

[Series 6: coronal mpr · coronal · 0.58mm/px · 1 of 106 slices shown]
[im 53/106  mediastinal]
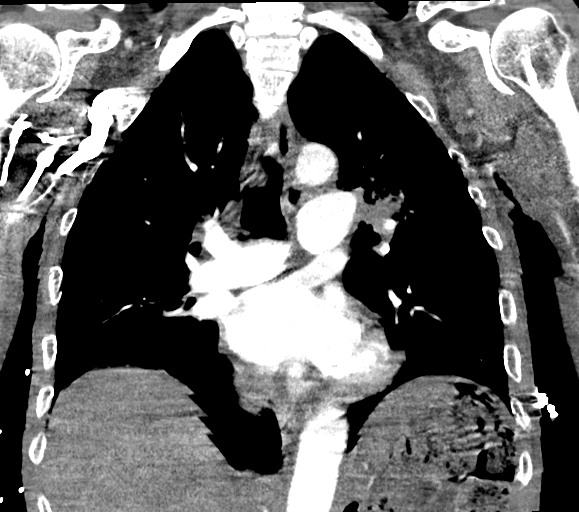

[16 of 36 positions shown; findings below may reference images not displayed]

FINDINGS: CTA CHEST FINDINGS

Cardiovascular: Thoracic aorta and its branches demonstrate
atherosclerotic calcifications. No aneurysmal dilatation or
dissection is seen. No cardiac enlargement is noted. No significant
coronary calcifications are seen. Pulmonary artery is well
visualized without intraluminal filling defect to suggest pulmonary
embolism.

Mediastinum/Nodes: The thoracic inlet shows no focal abnormality.
The known left thyroid lesion is not as well visualized due to
scatter artifact and enhancement in the thyroid. No significant
hilar or mediastinal adenopathy is noted. The esophagus as
visualized is within normal limits.

Lungs/Pleura: The right lung is well aerated without focal
infiltrate or sizable effusion. The left lung demonstrates confluent
density arising from the left suprahilar region extending superiorly
into the upper lobe. Occlusion of the upper lobe bronchus is noted
with some indentation upon the pulmonary artery on the left. This
may be related to metastatic disease with peripheral consolidation
although the possibility of a primary lesion could not be totally
excluded. This is stable from a prior CT examination from
09/20/2019. This area measures at least 6.8 x 4.3 cm. No other focal
mass is noted. No sizable effusion is seen.

Musculoskeletal: No chest wall abnormality. No acute or significant
osseous findings.

Review of the MIP images confirms the above findings.

CT ABDOMEN and PELVIS FINDINGS

Hepatobiliary: Liver demonstrates evidence of an 8.8 x 7.7 cm
centrally necrotic mass lesion with peripheral enhancement
consistent with metastatic disease. This corresponds to the recently
biopsied liver lesion. No other focal lesion is identified within
the liver. The gallbladder is unremarkable.

Pancreas: Unremarkable. No pancreatic ductal dilatation or
surrounding inflammatory changes.

Spleen: Normal in size without focal abnormality.

Adrenals/Urinary Tract: Adrenal glands are within normal limits. The
kidneys demonstrate cystic change bilaterally. Additionally some
patchy irregular enhancement of the right kidney is noted suspicious
for pyelonephritis. The bladder is decompressed by Foley catheter.

Stomach/Bowel: There are changes consistent with a descending loop
colostomy on the left consistent with the given clinical history.
The large rectal mass is again identified measuring at least 6.3 cm
in transverse diameter and extending for several cm along the course
of the rectosigmoid. The previously seen fluid collections in the
pelvis is not well visualized and likely represented some dilated
colon proximal to the obstructing lesion. Normal decompressed colon
is noted proximal to the rectosigmoid mass. No obstructive changes
in the large or small bowel are noted. The stomach is within normal
limits.

There is however a small fluid attenuation area adjacent to the
rectal mass best seen on image number 71 of series 3. This measures
3 x 2 cm and is suspicious for a small postoperative abscess.

Vascular/Lymphatic: Aortic atherosclerosis. No enlarged abdominal or
pelvic lymph nodes.

Reproductive: Prostate is unremarkable.

Other: No abdominal wall hernia or abnormality. No abdominopelvic
ascites.

Musculoskeletal: No acute or significant osseous findings.

Review of the MIP images confirms the above findings.
IMPRESSION: CTA of the chest: No evidence of pulmonary emboli.

Persistent mass lesion arising from the left hilum along the
mediastinum in into the upper lobe is again seen stable from prior
CT examination. Again this likely represents metastatic disease with
peripheral consolidation although the possibility of a primary
lesion deserves consideration.

The known left thyroid lesion is not well appreciated on today's
exam due to scatter artifact and underlying enhancement of the
thyroid.

CT of the abdomen and pelvis: Persistent large rectosigmoid mass
lesion consistent with the given clinical history of rectal
carcinoma. The previously seen fluid collection adjacent to this
mass likely represented a dilated loop of colon as no significant
abscess was noted on the intraoperative evaluation and no persistent
fluid collection is noted on today's exam.

There is however a small 3.0 x 2.0 cm fluid collection adjacent to
the rectal mass best seen on image number 71 of series 3 suspicious
for a small postoperative abscess.

Patchy enhancement pattern particularly within the right kidney
suspicious for pyelonephritis.

Metastatic disease within the posterior aspect of the right lobe of
the liver stable from previous studies.

New left mid abdominal loop colostomy.

## 2020-04-20 IMAGING — DX DG CHEST 1V PORT
1 series · 1 of 1 positions shown · non-contrast
Comparison: October 15, 2019

CLINICAL DATA: Central line insertion

EXAM:
PORTABLE CHEST 1 VIEW

[chest ap]
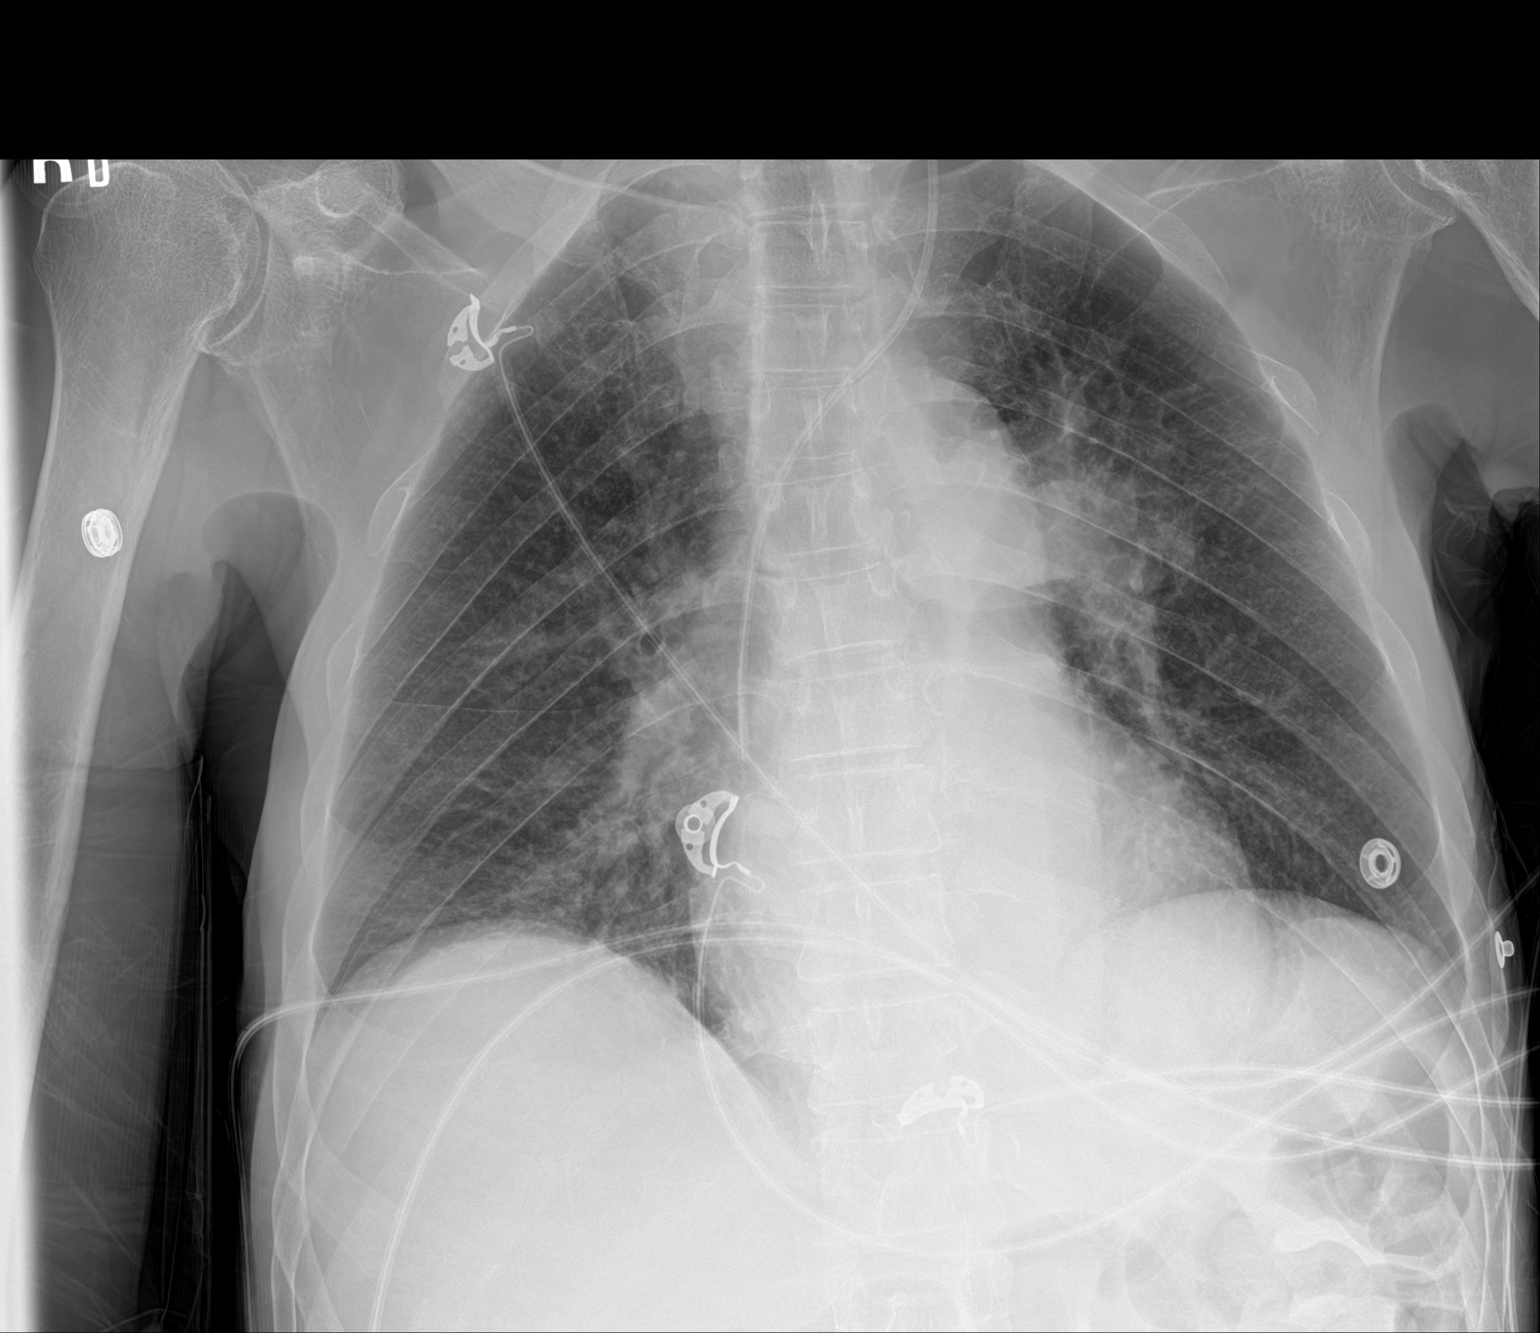

[1 of 1 positions shown; findings below may reference images not displayed]

FINDINGS: Interval placement of left IJ central line with tip overlying the
central SVC. No pneumothorax. Stable left perihilar density better
evaluated on recent CT. There is mild elevation of the left
hemidiaphragm. No pleural effusion. Stable cardiomediastinal
contours.
IMPRESSION: New left IJ central line tip overlying the SVC.  No pneumothorax.
